# Patient Record
Sex: Male | Born: 1950
Health system: Southern US, Community
[De-identification: ages and names within clinical notes are randomized; demographics above are authoritative.]

## PROBLEM LIST (undated history)

## (undated) DIAGNOSIS — F329 Major depressive disorder, single episode, unspecified: Secondary | ICD-10-CM

## (undated) DIAGNOSIS — N189 Chronic kidney disease, unspecified: Secondary | ICD-10-CM

## (undated) DIAGNOSIS — F419 Anxiety disorder, unspecified: Secondary | ICD-10-CM

## (undated) DIAGNOSIS — Z862 Personal history of diseases of the blood and blood-forming organs and certain disorders involving the immune mechanism: Secondary | ICD-10-CM

## (undated) DIAGNOSIS — Z87442 Personal history of urinary calculi: Secondary | ICD-10-CM

## (undated) DIAGNOSIS — G608 Other hereditary and idiopathic neuropathies: Secondary | ICD-10-CM

## (undated) DIAGNOSIS — N138 Other obstructive and reflux uropathy: Secondary | ICD-10-CM

## (undated) DIAGNOSIS — N401 Enlarged prostate with lower urinary tract symptoms: Secondary | ICD-10-CM

## (undated) DIAGNOSIS — M1990 Unspecified osteoarthritis, unspecified site: Secondary | ICD-10-CM

## (undated) DIAGNOSIS — C61 Malignant neoplasm of prostate: Secondary | ICD-10-CM

## (undated) DIAGNOSIS — N529 Male erectile dysfunction, unspecified: Secondary | ICD-10-CM

## (undated) DIAGNOSIS — M549 Dorsalgia, unspecified: Secondary | ICD-10-CM

## (undated) DIAGNOSIS — G609 Hereditary and idiopathic neuropathy, unspecified: Secondary | ICD-10-CM

## (undated) DIAGNOSIS — N410 Acute prostatitis: Secondary | ICD-10-CM

## (undated) DIAGNOSIS — I7121 Aneurysm of the ascending aorta, without rupture: Secondary | ICD-10-CM

## (undated) DIAGNOSIS — G40802 Other epilepsy, not intractable, without status epilepticus: Secondary | ICD-10-CM

## (undated) DIAGNOSIS — E7800 Pure hypercholesterolemia, unspecified: Secondary | ICD-10-CM

## (undated) DIAGNOSIS — R51 Headache: Secondary | ICD-10-CM

## (undated) DIAGNOSIS — F32A Depression, unspecified: Secondary | ICD-10-CM

## (undated) DIAGNOSIS — T7840XA Allergy, unspecified, initial encounter: Secondary | ICD-10-CM

## (undated) DIAGNOSIS — R569 Unspecified convulsions: Secondary | ICD-10-CM

## (undated) HISTORY — DX: Aneurysm of the ascending aorta, without rupture: I71.21

## (undated) HISTORY — DX: Hereditary and idiopathic neuropathy, unspecified: G60.9

## (undated) HISTORY — PX: COLONOSCOPY: SHX174

## (undated) HISTORY — DX: Other hereditary and idiopathic neuropathies: G60.8

## (undated) HISTORY — DX: Other epilepsy, not intractable, without status epilepticus: G40.802

## (undated) HISTORY — PX: TOE SURGERY: SHX1073

## (undated) HISTORY — DX: Chronic kidney disease, unspecified: N18.9

## (undated) HISTORY — PX: ELBOW FRACTURE SURGERY: SHX616

## (undated) HISTORY — PX: BACK SURGERY: SHX140

---

## 2000-12-04 ENCOUNTER — Ambulatory Visit (HOSPITAL_BASED_OUTPATIENT_CLINIC_OR_DEPARTMENT_OTHER): Admission: RE | Admit: 2000-12-04 | Discharge: 2000-12-04 | Payer: Self-pay | Admitting: *Deleted

## 2001-12-16 ENCOUNTER — Emergency Department (HOSPITAL_COMMUNITY): Admission: EM | Admit: 2001-12-16 | Discharge: 2001-12-16 | Payer: Self-pay

## 2001-12-17 ENCOUNTER — Ambulatory Visit (HOSPITAL_COMMUNITY): Admission: RE | Admit: 2001-12-17 | Discharge: 2001-12-17 | Payer: Self-pay

## 2002-05-30 ENCOUNTER — Emergency Department (HOSPITAL_COMMUNITY): Admission: EM | Admit: 2002-05-30 | Discharge: 2002-05-30 | Payer: Self-pay | Admitting: Emergency Medicine

## 2002-05-30 ENCOUNTER — Encounter: Payer: Self-pay | Admitting: Emergency Medicine

## 2002-06-03 ENCOUNTER — Ambulatory Visit (HOSPITAL_COMMUNITY): Admission: RE | Admit: 2002-06-03 | Discharge: 2002-06-03 | Payer: Self-pay | Admitting: Neurology

## 2002-07-02 ENCOUNTER — Ambulatory Visit (HOSPITAL_COMMUNITY): Admission: RE | Admit: 2002-07-02 | Discharge: 2002-07-02 | Payer: Self-pay | Admitting: Neurology

## 2005-10-30 ENCOUNTER — Emergency Department (HOSPITAL_COMMUNITY): Admission: EM | Admit: 2005-10-30 | Discharge: 2005-10-30 | Payer: Self-pay | Admitting: Emergency Medicine

## 2009-03-01 ENCOUNTER — Encounter: Admission: RE | Admit: 2009-03-01 | Discharge: 2009-03-01 | Payer: Self-pay | Admitting: Family Medicine

## 2010-05-30 ENCOUNTER — Encounter: Admission: RE | Admit: 2010-05-30 | Discharge: 2010-05-30 | Payer: Self-pay | Admitting: Family Medicine

## 2011-03-30 NOTE — Consult Note (Signed)
[place] [place]  Patient:    Benjamin [name], Benjamin [name] Visit Number: [id number] MRN: [id number]          Service Type: EMS Location: [place] Attending Physician:  Lorre Nick Proc. Date: 05/30/02 Admit Date:  05/30/2002 Discharge Date: 05/30/2002                            Consultation Report  HISTORY OF PRESENT ILLNESS:  The patient is a 60 year old Caucasian right-handed gentleman who presented to the ER today on May 30, 2002, at 11:16 with two seizures that he suffered at home, one of them observed by his wife.  The patient is a gentleman usually in good health who just recently returned from a vacation on the beach.  Last night he was not able to sleep very well at home and he stated he got less than four hours of sleep as he has a couple of days prior.  He also had diarrhea and nausea but does not recall vomiting.  He states he felt really "lousy" this morning and when his wife came home after a morning walk, she found him with a slowed speech and drowsy appearing, puffy, swollen face in his bed and noticed that on his pillow there was saliva with blood.  The patient said that he was asked by his wife to come down with her and he rested for a moment on the sofa.  Apparently at that time, a second very brief seizure was observed and this time with rhythmic arm jerking, his neck extended, his eyes wide open and the back somewhat bent in ___________ fashion.  The patient seemed to have seized for less than 15 seconds and then became very, very sleepy but was not confused.  The patients wife had meanwhile alarmed EMS squad.  The patient received, in his home, Valium and was then transferred to [facility] ER where was immediately seen and a head CT was obtained; the reading of the radiologist is negative for any acute changes.  In addition, there were blood tests obtained and a white blood cell count was slightly elevated at 11.4,  hemoglobin 15.5, hematocrit 44.8.  The patient has no abnormal platelet count either at 193.  He has some demargination of his neutrophils at 88% and his granulocytes are 10%.  Lymphocytes appear low in the differential.  No eosinophilia or basophilia.  A metabolic panel was also obtained showing a normal chem-7 except for slightly elevated glucose which is probably related to the patients late intake of crackers and juice.  Calcium, total protein and albumin were also all within normal range as were the AST, ALT, alkaline phosphatase, and bilirubin.  A drug screen was not obtained and the patient appears borderline febrile here in the ER with temperatures between 100 and 99.7.  His blood pressure is 120/80, heart rate is in the 70s, respiratory rate at rest is 16.  He is still very drowsy.  PAST MEDICAL HISTORY:  The patient had a seizure in February which was precipitated by a bronchitis exacerbation.  The patient was, at the same time, treated doxycycline.  He has a history of psoriasis with osteoarthritis and low back pain.  MEDICATIONS:  None.  ALLERGIES:  No known drug allergies.  FAMILY HISTORY:  Negative for seizures.  SOCIAL HISTORY:  The patient is married.  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 The couple has one child.  REVIEW OF SYSTEMS:  The patient feels drowsy.  His tongue hurts.  He has trouble swallowing without pain.  No dysarthria, however.  He can follow all commands.  He is alert and oriented x3, very pleasant as was his family.  He has no recollection of the seizures but believes that he was not confused afterwards which is confirmed by his wifes report.  PHYSICAL EXAMINATION:  Cranial nerves:  Pupils are equal, round and reactive to light and accommodation.  The patient has full visual fields with bilateral stimulation simultaneously.  The retina appears normally perfused.  There is no  pallor and no swelling in the optic nerve.  The patients visual acuity is intact.  He complains of a headache, especially in the frontal region.  His tongue and uvula are midline. The tongue bite is right lateral.  There is no blood seen anymore.  The lips are intact without any bite signs there but has bitten his inner cheeks.  Facial, sensory and motor are otherwise intact.  The neck was supple.  Motor exam shows equal strength, tone and mass bilaterally. The patient is able to move all extremities.  Sensory is intact to touch, vibration and pinprick.  Deep tendon reflexes can be obtained in the upper extremities as trace.  Patellar reflexes, I cannot elicit.  The patients Achilles tendon reflexes are not elicitable; however, the movement of these limbs is full range of motion and I cannot obtain an upgoing toe response of stimulation nor clonus.  The patient is fairly unable to relax at this point. After turned to the side, he complains about lower back pain.  Palpation shows that his pain is mostly in the lower rib cage on the right side and that he has some flank pain bilaterally.  Coordination and finger-to-nose is intact. No ataxia, tremor, dysmetria.  Gait and station were deferred.  The patient could sit up without assistance but with some low back pain.  ASSESSMENT:  Two seizures in the early morning hours.  The patient is still somewhat slowed and tired but shows no focal deficits, no combativeness and no confusion.  His previous seizure, a single event observed in February of this year, was precipitated by a bronchitis.  This time, he has a gastrointestinal infection or a noninfectious source of abdominal discomfort with nausea and diarrhea which kept him up for a couple of nights now.  He got sufficiently hydrated in the ER.  He has had 1 g of Celebrex IV and I will initiate with his discharge at 300 mg q.h.s. follow-up therapy.  The patient will also be discharged on 1 mg of  Ativan as to allow him to build up a sufficient blood level with additional cover.  He is not suppose to drive, etc., for 14 days.  He is not to operate machinery or even drive a riding lawnmower and he should not swim in unattended waters.  He was told rather to use a shower than a bathtub for risk of drowning until his antiepileptic medication has reached a sufficient blood level.  He is suppose to follow up with me or Candy Sledge, M.D., whom he saw in February at the [facility] office on [address] phone number [phone].  We will try to place the appointment with an EEG at the time visit.  My recommendations were discussed with the ER attending on duty.  The patient  appears stabilized. Attending Physician:  Lorre Nick DD:  05/30/02 TD:  06/03/02 Job: [id number]

## 2012-07-03 DIAGNOSIS — C61 Malignant neoplasm of prostate: Secondary | ICD-10-CM

## 2012-07-03 DIAGNOSIS — N410 Acute prostatitis: Secondary | ICD-10-CM

## 2012-07-03 HISTORY — DX: Malignant neoplasm of prostate: C61

## 2012-07-03 HISTORY — DX: Acute prostatitis: N41.0

## 2012-07-06 ENCOUNTER — Encounter (HOSPITAL_COMMUNITY): Payer: Self-pay | Admitting: Emergency Medicine

## 2012-07-06 ENCOUNTER — Inpatient Hospital Stay (HOSPITAL_COMMUNITY)
Admission: EM | Admit: 2012-07-06 | Discharge: 2012-07-08 | DRG: 699 | Disposition: A | Discharge status: Home / Self Care | Payer: Managed Care, Other (non HMO) | Attending: Urology | Admitting: Urology

## 2012-07-06 DIAGNOSIS — Z888 Allergy status to other drugs, medicaments and biological substances status: Secondary | ICD-10-CM

## 2012-07-06 DIAGNOSIS — IMO0002 Reserved for concepts with insufficient information to code with codable children: Principal | ICD-10-CM | POA: Diagnosis present

## 2012-07-06 DIAGNOSIS — Y92009 Unspecified place in unspecified non-institutional (private) residence as the place of occurrence of the external cause: Secondary | ICD-10-CM

## 2012-07-06 DIAGNOSIS — N9989 Other postprocedural complications and disorders of genitourinary system: Principal | ICD-10-CM | POA: Diagnosis present

## 2012-07-06 DIAGNOSIS — N390 Urinary tract infection, site not specified: Secondary | ICD-10-CM

## 2012-07-06 DIAGNOSIS — T8140XA Infection following a procedure, unspecified, initial encounter: Secondary | ICD-10-CM

## 2012-07-06 DIAGNOSIS — C61 Malignant neoplasm of prostate: Secondary | ICD-10-CM | POA: Diagnosis present

## 2012-07-06 DIAGNOSIS — N410 Acute prostatitis: Secondary | ICD-10-CM | POA: Diagnosis present

## 2012-07-06 DIAGNOSIS — A498 Other bacterial infections of unspecified site: Secondary | ICD-10-CM | POA: Diagnosis present

## 2012-07-06 DIAGNOSIS — Y849 Medical procedure, unspecified as the cause of abnormal reaction of the patient, or of later complication, without mention of misadventure at the time of the procedure: Secondary | ICD-10-CM | POA: Diagnosis present

## 2012-07-06 HISTORY — DX: Unspecified convulsions: R56.9

## 2012-07-06 LAB — BASIC METABOLIC PANEL
CO2: 30 mEq/L (ref 19–32)
Chloride: 99 mEq/L (ref 96–112)
GFR calc Af Amer: 79 mL/min — ABNORMAL LOW (ref >90)
Potassium: 3.7 mEq/L (ref 3.5–5.1)
Sodium: 138 mEq/L (ref 135–145)

## 2012-07-06 LAB — CBC
Platelets: 150 10*3/uL (ref 150–400)
RBC: 5.27 MIL/uL (ref 4.22–5.81)
RDW: 13.1 % (ref 11.5–15.5)
WBC: 14.6 10*3/uL — ABNORMAL HIGH (ref 4.0–10.5)

## 2012-07-06 LAB — URINALYSIS, ROUTINE W REFLEX MICROSCOPIC
Nitrite: POSITIVE — AB
Protein, ur: NEGATIVE mg/dL
Specific Gravity, Urine: 1.025 (ref 1.005–1.030)
Urobilinogen, UA: 1 mg/dL (ref 0.0–1.0)

## 2012-07-06 MED ORDER — LEVETIRACETAM 750 MG PO TABS
750.0000 mg | ORAL_TABLET | Freq: Two times a day (BID) | ORAL | Status: DC
  Administered 2012-07-06 – 2012-07-08 (×4): 750 mg via ORAL
  Filled 2012-07-06 (×6): qty 1

## 2012-07-06 MED ORDER — ZOLPIDEM TARTRATE 5 MG PO TABS: 5.0000 mg | ORAL_TABLET | Freq: Every evening | ORAL | Status: DC | PRN

## 2012-07-06 MED ORDER — DOCUSATE SODIUM 100 MG PO CAPS
100.0000 mg | ORAL_CAPSULE | Freq: Two times a day (BID) | ORAL | Status: DC
  Administered 2012-07-06 – 2012-07-08 (×4): 100 mg via ORAL
  Filled 2012-07-06 (×5): qty 1

## 2012-07-06 MED ORDER — HYDROCODONE-ACETAMINOPHEN 5-325 MG PO TABS: 1.0000 | ORAL_TABLET | ORAL | Status: DC | PRN

## 2012-07-06 MED ORDER — ACETAMINOPHEN 325 MG PO TABS: 650.0000 mg | ORAL_TABLET | ORAL | Status: DC | PRN

## 2012-07-06 MED ORDER — DEXTROSE-NACL 5-0.45 % IV SOLN: INTRAVENOUS | Status: DC

## 2012-07-06 MED ORDER — PIPERACILLIN-TAZOBACTAM 3.375 G IVPB
3.3750 g | Freq: Three times a day (TID) | INTRAVENOUS | Status: DC
  Administered 2012-07-06 – 2012-07-08 (×7): 3.375 g via INTRAVENOUS
  Filled 2012-07-06 (×8): qty 50

## 2012-07-06 MED ORDER — PIPERACILLIN-TAZOBACTAM 3.375 G IVPB: 3.3750 g | Freq: Three times a day (TID) | INTRAVENOUS | Status: DC

## 2012-07-06 MED ORDER — ACETAMINOPHEN 325 MG PO TABS
650.0000 mg | ORAL_TABLET | ORAL | Status: DC | PRN
  Administered 2012-07-06 – 2012-07-08 (×3): 650 mg via ORAL
  Filled 2012-07-06 (×3): qty 2

## 2012-07-06 MED ORDER — SODIUM CHLORIDE 0.45 % IV SOLN
INTRAVENOUS | Status: DC
  Administered 2012-07-06 – 2012-07-08 (×4): via INTRAVENOUS

## 2012-07-06 MED ORDER — DOCUSATE SODIUM 100 MG PO CAPS: 100.0000 mg | ORAL_CAPSULE | Freq: Two times a day (BID) | ORAL | Status: DC

## 2012-07-06 NOTE — ED Notes (Signed)
Pt states that he had a prostate biopsy on Thursday and yesterday started not feeling well--cold chills, dysuria.  Denies NVD.  Had a fever 101.3 last night and took 1000 mg tylenol.

## 2012-07-06 NOTE — ED Notes (Signed)
Attempted to call report.  Nurse busy. 

## 2012-07-06 NOTE — ED Provider Notes (Signed)
History     CSN: [id number]  Arrival date & time 07/06/12  1057   First MD Initiated Contact with Patient 07/06/12 1146      No chief complaint on file.   (Consider location/radiation/quality/duration/timing/severity/associated sxs/prior treatment) HPI Comments: Mr. Sohn had a prostate biopsy performed by his urologist on 8/23 (secondary to elevated PSA and screening for prostate CA).  He states there were no complications during the procedure and he was discharged home shortly after it's completion.  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  Yesterday evening he developed intense chills and fever.  He also states he went to urinate many times and felt as if his stream was not a strong as usual.  Currently he states he has mild discomfort but no severe pain. He deniess nausea and vomiting.  The history is provided by the patient and the spouse. No language interpreter was used.    No past medical history on file.  No past surgical history on file.  No family history on file.  History  Substance Use Topics  . Smoking status: Not on file  . Smokeless tobacco: Not on file  . Alcohol Use: Not on file      Review of Systems  Constitutional: Positive for fever, chills and fatigue. Negative for diaphoresis, activity change and appetite change.  Eyes: Negative.   Respiratory: Negative.   Cardiovascular: Negative.   Gastrointestinal: Negative for nausea, vomiting, abdominal pain, diarrhea, constipation, blood in stool, abdominal distention and anal bleeding.  Genitourinary: Positive for urgency, frequency and difficulty urinating. Negative for dysuria, hematuria, flank pain, decreased urine volume, scrotal swelling, enuresis, genital sores, penile pain and testicular pain.  Musculoskeletal: Negative.   Neurological: Negative.   Psychiatric/Behavioral: Negative.     Allergies  Review of patient's allergies indicates no  known allergies.  Home Medications   Current Outpatient Rx  Name Route Sig Dispense Refill  . ACETAMINOPHEN 325 MG PO TABS Oral Take 650 mg by mouth every 6 (six) hours as needed.    . ACETAMINOPHEN 500 MG PO TABS Oral Take 500 mg by mouth every 6 (six) hours as needed. Pain    . DOXYCYCLINE HYCLATE 100 MG PO TABS Oral Take 100 mg by mouth 2 (two) times daily.    Marland Kitchen LEVETIRACETAM 750 MG PO TABS Oral Take 750 mg by mouth every 12 (twelve) hours.    . OMEGA-3 FATTY ACIDS 1000 MG PO CAPS Oral Take 2 g by mouth daily.      BP 126/86  Pulse 75  Temp 98.2 F (36.8 C) (Oral)  Resp 16  SpO2 98%  Physical Exam  Nursing note and vitals reviewed. Constitutional: He appears well-developed and well-nourished. No distress.  HENT:  Head: Normocephalic and atraumatic.  Right Ear: External ear normal.  Left Ear: External ear normal.  Nose: Nose normal.  Mouth/Throat: Oropharynx is clear and moist. No oropharyngeal exudate.  Eyes: Conjunctivae and EOM are normal. Pupils are equal, round, and reactive to light. Right eye exhibits no discharge. Left eye exhibits no discharge. No scleral icterus.  Neck: Normal range of motion. Neck supple. No JVD present. No tracheal deviation present. No thyromegaly present.  Cardiovascular: Normal rate, regular rhythm and intact distal pulses.  Exam reveals no gallop and no friction rub.   No murmur heard. Pulmonary/Chest: Effort normal and breath sounds normal. No stridor. No respiratory distress. He has no wheezes. He has no rales. He  exhibits no tenderness.  Abdominal: Soft. Bowel sounds are normal. He exhibits no distension and no mass. There is no tenderness. There is no rebound and no guarding.  Musculoskeletal: Normal range of motion. He exhibits no edema and no tenderness.  Lymphadenopathy:    He has no cervical adenopathy.  Skin: Skin is warm and dry. No rash noted. He is not diaphoretic. No erythema. No pallor.  Psychiatric: He has a normal mood and  affect. His behavior is normal. Thought content normal.    ED Course  Procedures (including critical care time)   Labs Reviewed  URINALYSIS, ROUTINE W REFLEX MICROSCOPIC  CBC  BASIC METABOLIC PANEL  URINE CULTURE   No results found.   No diagnosis found.    MDM  Pt presents 2 days s/p prostate biopsy for evaluation of fever and chills.  He also reports an increased urinary freq and urgency and a sensation of incomplete bladder emptying since having the procedure performed.  He currently is comfortable, afebrile (did rec tylenol prior to coming to ER) - NAD.  Plan U/A and urine culture + basic labs.  Pt voided and a bladder scan revealed 100cc retained urine.  Plan allow pt to attempt voiding again and repeat bladder scan.  Will review results as available and provide appropriate disposition.  Note leukocytosis an abnormal U/A.  Urine culture has been obtained.  Discussed his procedure and subsequent symptoms with Dr. Radene Journey (urologist).  Although he did not perform the procedure, he is familiar with his case.  Secondary to fever and chills (concerns for infection), he will be admitted for observation and parenteral antibiotics.      Tobin Chad, MD 07/06/12 1447

## 2012-07-06 NOTE — ED Notes (Signed)
Bladder scan showed 109 ml

## 2012-07-06 NOTE — H&P (Signed)
Urology History and Physical Exam  CC: Fever,chills  HPI: 61 year old male presents to the ER @ [facility] with a 24 hour history of fever, chills and difficulty urinating. He underwent transrectal ultrasound and biopsy of his prostate for elevated PSA by Dr. Barron Alvine in our office on 07/03/2012. The procedure was without difficulty. He had a relatively small prostate. Yesterday, he began having fever to 101.4, as well as chills. Additionally, he started having urinary frequency, urgency, dysuria and feeling of incomplete emptying. He called me last night, and I started on doxycycline. I told him that if he is not better within 12-16 hours to call back. He called back this morning, again with persistent fever, shakes, chills and greater difficulty urinating. It was recommended that he come to the emergency room for further evaluation/probable admission.  PMH: Past Medical History  Diagnosis Date  . Seizures     PSH: History reviewed. No pertinent past surgical history.  Allergies: Allergies  Allergen Reactions  . Benadryl (Diphenhydramine)     seizures    Medications:  (Not in a hospital admission)   Social History: History   Social History  . Marital Status: Married    Spouse Name: N/A    Number of Children: N/A  . Years of Education: N/A   Occupational History  . Not on file.   Social History Main Topics  . Smoking status: Never Smoker   . Smokeless tobacco: Not on file  . Alcohol Use: No  . Drug Use: No  . Sexually Active:    Other Topics Concern  . Not on file   Social History Narrative  . No narrative on file    Family History: History reviewed. No pertinent family history.  Review of Systems: Positive: Fever, chills, dysuria, frequency, urgency, back pain, neurologic symptoms to his lower extremities from spinal stenosis Negative:   A further 10 point review of systems was negative except what is listed in the HPI.  Physical  Exam: @VITALS2 @ General: No acute distress.  Awake. Skin was warm to the touch. Head:  Normocephalic.  Atraumatic. ENT:  EOMI.  Mucous membranes moist Neck:  Supple.  No lymphadenopathy. CV:  S1 present. S2 present. Regular rate. Pulmonary: Equal effort bilaterally.  Clear to auscultation bilaterally. Abdomen: Soft.  Non- tender to palpation. Bladder is nonpalpable. Skin:  Normal turgor.  No visible rash. Extremity: No gross deformity of bilateral upper extremities.  No gross deformity of    bilateral lower extremities. Neurologic: Alert. Appropriate mood.  Penis:  Circumcised.  No lesions. Urethra:           Orthotopic meatus. Scrotum: No lesions.  No ecchymosis.  No erythema. Testicles: Descended bilaterally.  No masses bilaterally. No tenderness. Epididymis: Palpable bilaterally.  Non Tender to palpation.  Studies:  Recent Labs  Basename 07/06/12 1235   HGB 16.6   WBC 14.6*   PLT 150    Recent Labs  Basename 07/06/12 1235   NA 138   K 3.7   CL 99   CO2 30   BUN 12   CREATININE 1.15   CALCIUM 9.7   GFRNONAA 68*   GFRAA 79*     No results found for this basename: PT:2,INR:2,APTT:2 in the last 72 hours   No components found with this basename: ABG:2    Assessment:  Fever/chills/urinary difficulty following transrectal biopsy of his prostate 3 days ago. The patient may well have a resistant prostatitis/UTI. He does not appear toxic, but he  does have leukocytosis and infected urine.  Plan: I discussed admission with the patient and his wife. I would suggest we bring him in. He seems to be emptying out decently, but may need a catheter if he is having retention later on. I will put him on Zosyn, and urine cultures have been sent.

## 2012-07-06 NOTE — ED Notes (Addendum)
Pt states he had a prostate biopsy on Thursday and has now had difficulty urinating. He has the urge to go but is having to strain in order to get urine out and is still having low output. Pt states he is going to the restroom multiple times during the day. Pt began running a fever last night and was down to 99 this morning. Here in the ED his time in 98.2 orally. Instructed pt to get into gown and will return to scan bladder.

## 2012-07-07 LAB — CBC
HCT: 41.7 % (ref 39.0–52.0)
Hemoglobin: 14.6 g/dL (ref 13.0–17.0)
MCH: 30.9 pg (ref 26.0–34.0)
MCHC: 35 g/dL (ref 30.0–36.0)
MCV: 88.3 fL (ref 78.0–100.0)
RBC: 4.72 MIL/uL (ref 4.22–5.81)

## 2012-07-07 NOTE — Progress Notes (Signed)
   CARE MANAGEMENT NOTE 07/07/2012  Patient:  Benjamin [name], Benjamin [name]   Account Number:  [id number]  Date Initiated:  07/07/2012  Documentation initiated by:  Jiles Crocker  Subjective/Objective Assessment:   ADMITTED WITH Febrile urinary tract infections/prostatitis status post prostate biopsy.     Action/Plan:   LIVES WITH SPOUSE   Anticipated DC Date:  07/14/2012   Anticipated DC Plan:  HOME/SELF CARE      DC Planning Services  CM consult            Status of service:  In process, will continue to follow Medicare Important Message given?  NA - LOS <3 / Initial given by admissions (If response is "NO", the following Medicare IM given date fields will be blank)  Per UR Regulation:  Reviewed for med. necessity/level of care/duration of stay  Comments:  07/07/2012- B Emmylou Bieker RN, BSN, MHA

## 2012-07-07 NOTE — Progress Notes (Signed)
Patient has ambulated in hallway this afternoon >200 feet. Patient tolerated well. Incentive spirometer also encouraged.Will continue to monitor patient. Setzer, Don Broach

## 2012-07-07 NOTE — Progress Notes (Signed)
Patient ID: Benjamin [name], male   DOB: [birth year], 61 y.o.   MRN: [id number]   Subjective: Patient reports Feeling much better this morning. Some shaking chills last evening. He has now been afebrile. Voiding okay.  Objective: Vital signs in last 24 hours: Temp:  [98.3 F (36.8 C)-102.4 F (39.1 C)] 98.3 F (36.8 C) (08/26 0605) Pulse Rate:  [77-98] 77  (08/26 0605) Resp:  [18] 18  (08/26 0605) BP: (116-137)/(78-81) 116/80 mmHg (08/26 0605) SpO2:  [95 %-99 %] 95 % (08/26 0605) Weight:  [97.3 kg (214 lb 8.1 oz)] 97.3 kg (214 lb 8.1 oz) (08/25 1544)  Intake/Output from previous day: 08/25 0701 - 08/26 0700 In: 2236.3 [P.O.:960; I.V.:1126.3; IV Piggyback:150] Out: 1800 [Urine:1800] Intake/Output this shift: Total I/O In: 240 [P.O.:240] Out: 280 [Urine:280]  Physical Exam:  Constitutional: Vital signs reviewed. WD WN in NAD   Eyes: PERRL, No scleral icterus.   Cardiovascular: RRR Pulmonary/Chest: Normal effort Abdominal: Soft. Non-tender, non-distended, bowel sounds are normal, no masses, organomegaly, or guarding present.  Extremities: No cyanosis or edema   Lab Results:  Basename 07/07/12 0501 07/06/12 1235  HGB 14.6 16.6  HCT 41.7 46.7   BMET  Basename 07/06/12 1235  NA 138  K 3.7  CL 99  CO2 30  GLUCOSE 124*  BUN 12  CREATININE 1.15  CALCIUM 9.7   No results found for this basename: LABPT:3,INR:3 in the last 72 hours No results found for this basename: LABURIN:1 in the last 72 hours No results found for this or any previous visit.  Studies/Results: No results found.  Assessment/Plan:   Febrile urinary tract infections/prostatitis status post prostate biopsy. Patient appears to be clinically improved on IV Zosyn. He will need to stay on IV antibiotics until all urine culture data and sensitivities are back. Will review pathology from the office.   LOS: 1 day   Cordarryl Monrreal S 07/07/2012, 1:14 PM

## 2012-07-07 NOTE — Progress Notes (Signed)
ANTIBIOTIC CONSULT NOTE - FOLLOW UP  Pharmacy Consult for pharmacy to adjust antibiotics (zosyn) for renal function Indication: Prostatitis  Allergies  Allergen Reactions  . Benadryl (Diphenhydramine)     seizures    Patient Measurements: Height: 6\' 1"  (185.4 cm) Weight: 214 lb 8.1 oz (97.3 kg) IBW/kg (Calculated) : 79.9  Adjusted Body Weight:  Vital Signs: Temp: 98.3 F (36.8 C) (08/26 0605) Temp src: Oral (08/26 0605) BP: 116/80 mmHg (08/26 0605) Pulse Rate: 77  (08/26 0605) Intake/Output from previous day: 08/25 0701 - 08/26 0700 In: 2236.3 [P.O.:960; I.V.:1126.3; IV Piggyback:150] Out: 1800 [Urine:1800] Intake/Output from this shift: Total I/O In: 240 [P.O.:240] Out: 280 [Urine:280]  Labs:  [facility] 07/07/12 0501 07/06/12 1235  WBC 10.9* 14.6*  HGB 14.6 16.6  PLT 131* 150  LABCREA -- --  CREATININE -- 1.15   Estimated Creatinine Clearance: 85 ml/min (by C-G formula based on Cr of 1.15). No results found for this basename: VANCOTROUGH:2,VANCOPEAK:2,VANCORANDOM:2,GENTTROUGH:2,GENTPEAK:2,GENTRANDOM:2,TOBRATROUGH:2,TOBRAPEAK:2,TOBRARND:2,AMIKACINPEAK:2,AMIKACINTROU:2,AMIKACIN:2, in the last 72 hours   Microbiology: No results found for this or any previous visit (from the past 720 hour(s)).  Anti-infectives     Start     Dose/Rate Route Frequency Ordered Stop   07/06/12 1600  piperacillin-tazobactam (ZOSYN) IVPB 3.375 g       3.375 g 12.5 mL/hr over 240 Minutes Intravenous 3 times per day 07/06/12 1548     07/06/12 1400   piperacillin-tazobactam (ZOSYN) IVPB 3.375 g  Status:  Discontinued        3.375 g 12.5 mL/hr over 240 Minutes Intravenous 3 times per day 07/06/12 1350 07/06/12 1351          Assessment: 61 YOM presents with fever and chills with difficulty urinating.  S/p prostate biopsy.  He is on D#1 zosyn.  WBC improving, afebrile  8/25 Urine cx: pending  Goal of Therapy:  Resolution of infection  Plan:  -Continue Zosyn 3.375 gm IV q8h with  4h infusion, f/u culture results  Dannielle Huh 07/07/2012,1:18 PM

## 2012-07-08 LAB — URINE CULTURE: Colony Count: 25000

## 2012-07-08 NOTE — Progress Notes (Signed)
Patient ID: Benjamin [name], male   DOB: Sep 02, 1951, 61 y.o.   MRN: [id number]   Subjective: Patient reports doing better today. He is voiding fairly well at this time. He has been afebrile. Urine culture is voluntarily positive for Escherichia coli with sensitivities are still not available.  Objective: Vital signs in last 24 hours: Temp:  [97.9 F (36.6 C)-98.8 F (37.1 C)] 97.9 F (36.6 C) (08/27 0605) Pulse Rate:  [69-79] 69  (08/27 0605) Resp:  [17-18] 18  (08/27 0605) BP: (98-119)/(62-76) 99/65 mmHg (08/27 0605) SpO2:  [96 %-98 %] 98 % (08/27 0605)  Intake/Output from previous day: 08/26 0701 - 08/27 0700 In: 2430 [P.O.:480; I.V.:1800; IV Piggyback:150] Out: 1180 [Urine:1180] Intake/Output this shift: Total I/O In: -  Out: 550 [Urine:550]  Physical Exam:  Constitutional: Vital signs reviewed. WD WN in NAD   Eyes: PERRL, No scleral icterus.   Cardiovascular: RRR Pulmonary/Chest: Normal effort Abdominal: Soft. Non-tender, non-distended, bowel sounds are normal, no masses, organomegaly, or guarding present.  Extremities: No cyanosis or edema   Lab Results:  Basename 07/07/12 0501 07/06/12 1235  HGB 14.6 16.6  HCT 41.7 46.7   BMET  Basename 07/06/12 1235  NA 138  K 3.7  CL 99  CO2 30  GLUCOSE 124*  BUN 12  CREATININE 1.15  CALCIUM 9.7   No results found for this basename: LABPT:3,INR:3 in the last 72 hours No results found for this basename: LABURIN:1 in the last 72 hours Results for orders placed during the hospital encounter of 07/06/12  URINE CULTURE     Status: Normal (Preliminary result)   Collection Time   07/06/12  1:04 PM      Component Value Range Status Comment   Specimen Description URINE, CLEAN CATCH   Final    Special Requests NONE   Final    Culture  Setup Time 07/06/2012 16:55   Final    Colony Count 25,000 COLONIES/ML   Final    Culture ESCHERICHIA COLI   Final    Report Status PENDING   Incomplete     Studies/Results: No results  found.  Assessment/Plan:   Febrile urinary tract infection/acute prostatitis status post transrectal ultrasound of the prostate with biopsy. Patient is clinically much better. I would like to keep him on IV antibiotics until sensitivities are available to see if there is an appropriate oral choice. Hopefully discharge could be later today or tomorrow morning if that information becomes available. The patient would probably benefit from a short course of alpha blocker therapy as well. [name] biopsies unfortunately were positive for adenocarcinoma. One out of 12 biopsies did show Gleason 3+3 equal 6 cancer. 2 additional biopsy showed some atypia. The patient has what appears to be favorable clinical stage TI C. disease. With a pulmonary discussion about that today and I will arrange followup with cancer conference for he and his wife.   LOS: 2 days   Benjamin [name] S 07/08/2012, 8:12 AM

## 2012-07-08 NOTE — Discharge Summary (Signed)
Physician Discharge Summary  Patient ID: Halil Rentz MRN: [id number] DOB/AGE: 59-Dec-1952 59 y.o.  Admit date: 07/06/2012 Discharge date: 07/08/2012  Admission Diagnoses:Acute prostatitis  Discharge Diagnoses: #1 acute prostatitis #2 his prostate cancer Active Problems:  * No active hospital problems. *    Discharged Condition: good  Hospital Course: Patient was admitted with an acute prostatitis status post an ultrasound biopsy of the prostate. [name] urine culture did show Escherichia coli. The patient was treated with IV Zosyn with marked clinical improvement. Of note the patient's biopsy did show one out of 12 cores positive for adenocarcinoma of the prostate.  Consults: None  Significant Diagnostic Studies: Urine culture  Treatments: antibiotics: Zosyn  Discharge Exam: Blood pressure 129/79, pulse 71, temperature 97.6 F (36.4 C), temperature source Oral, resp. rate 18, height 6\' 1"  (1.854 m), weight 97.3 kg (214 lb 8.1 oz), SpO2 99.00%. Well-developed well-nourished male in no acute distress. Abdomen soft nontender.  Disposition: Patient to be discharged to home. Prescription sent for antibiotic  as well as tamsulosin. Followup arranged.  Medication List  As of 07/08/2012  2:59 PM   TAKE these medications         acetaminophen 325 MG tablet   Commonly known as: TYLENOL   Take 650 mg by mouth every 6 (six) hours as needed.      acetaminophen 500 MG tablet   Commonly known as: TYLENOL   Take 500 mg by mouth every 6 (six) hours as needed. Pain      doxycycline 100 MG tablet   Commonly known as: VIBRA-TABS   Take 100 mg by mouth 2 (two) times daily.      fish oil-omega-3 fatty acids 1000 MG capsule   Take 2 g by mouth daily.      levETIRAcetam 750 MG tablet   Commonly known as: KEPPRA   Take 750 mg by mouth every 12 (twelve) hours.           Follow-up Information    Follow up with Valetta Fuller, MD on 08/04/2012. (At 4:00)    Contact information:   [address] [place], [address] [facility] [place] [address] [phone]          Signed: Valetta Fuller 07/08/2012, 2:59 PM

## 2012-12-16 ENCOUNTER — Ambulatory Visit: Payer: Managed Care, Other (non HMO) | Admitting: Radiation Oncology

## 2012-12-16 ENCOUNTER — Ambulatory Visit: Payer: Managed Care, Other (non HMO)

## 2012-12-22 ENCOUNTER — Encounter: Payer: Self-pay | Admitting: Radiation Oncology

## 2012-12-22 NOTE — Progress Notes (Signed)
New Consult Prostate Cancer DX 07/03/12 Gleason=3+3=6, Volume=20cc,PSA=5.9 Under active surveillance,  Married,1 daughter, Jola Babinski, alert,oriented x 3, no dysuria, feels he has to void at times, and doesn't go , then other times urgency , doesn't empty bladder at times, bowels regular Back pain(lower) s/p stenosis ,     Allergies:Aspartame powder,Sinus formula tablet  No history Radiation No history Pacemaker

## 2012-12-23 ENCOUNTER — Ambulatory Visit
Admission: RE | Admit: 2012-12-23 | Discharge: 2012-12-23 | Disposition: A | Discharge status: Home / Self Care | Payer: Managed Care, Other (non HMO) | Source: Ambulatory Visit | Attending: Radiation Oncology | Admitting: Radiation Oncology

## 2012-12-23 ENCOUNTER — Encounter: Payer: Self-pay | Admitting: Radiation Oncology

## 2012-12-23 VITALS — BP 128/87 | HR 78 | Temp 98.2°F | Resp 20 | Ht 71.0 in | Wt 224.3 lb

## 2012-12-23 DIAGNOSIS — C61 Malignant neoplasm of prostate: Secondary | ICD-10-CM

## 2012-12-23 DIAGNOSIS — N529 Male erectile dysfunction, unspecified: Secondary | ICD-10-CM | POA: Insufficient documentation

## 2012-12-23 DIAGNOSIS — E7800 Pure hypercholesterolemia, unspecified: Secondary | ICD-10-CM | POA: Insufficient documentation

## 2012-12-23 DIAGNOSIS — Z79899 Other long term (current) drug therapy: Secondary | ICD-10-CM | POA: Insufficient documentation

## 2012-12-23 HISTORY — DX: Malignant neoplasm of prostate: C61

## 2012-12-23 HISTORY — DX: Pure hypercholesterolemia, unspecified: E78.00

## 2012-12-23 HISTORY — DX: Benign prostatic hyperplasia with lower urinary tract symptoms: N13.8

## 2012-12-23 HISTORY — DX: Depression, unspecified: F32.A

## 2012-12-23 HISTORY — DX: Unspecified osteoarthritis, unspecified site: M19.90

## 2012-12-23 HISTORY — DX: Major depressive disorder, single episode, unspecified: F32.9

## 2012-12-23 HISTORY — DX: Acute prostatitis: N41.0

## 2012-12-23 HISTORY — DX: Dorsalgia, unspecified: M54.9

## 2012-12-23 HISTORY — DX: Male erectile dysfunction, unspecified: N52.9

## 2012-12-23 HISTORY — DX: Personal history of diseases of the blood and blood-forming organs and certain disorders involving the immune mechanism: Z86.2

## 2012-12-23 HISTORY — DX: Allergy, unspecified, initial encounter: T78.40XA

## 2012-12-23 HISTORY — DX: Anxiety disorder, unspecified: F41.9

## 2012-12-23 HISTORY — DX: Benign prostatic hyperplasia with lower urinary tract symptoms: N40.1

## 2012-12-23 NOTE — Progress Notes (Signed)
[facility] Radiation Oncology NEW PATIENT EVALUATION  Name: Benjamin [name] MRN: [id number]  Date:   12/23/2012           DOB: 1951/05/14  Status: outpatient   CC:   Valetta Fuller, MD    REFERRING PHYSICIAN: Valetta Fuller, MD   DIAGNOSIS:  Stage TI C. favorable risk adenocarcinoma prostate  HISTORY OF PRESENT ILLNESS:  Benjamin [name] is a 62 y.o. male who is seen today for the courtesy of Dr. Barron Alvine for evaluation of his stage TI C. favorable risk adenocarcinoma prostate. He had a PSA of 3.2 in August of 2011, rising to 4.1 in early 2013. A repeat PSA was 5.0 with a PSA to percentage of only 10%. He therefore underwent ultrasound-guided biopsies by Dr. Isabel Caprice on 07/03/2012. He was found have Gleason 6 (3+3) and 25% of one core from the left lateral apex. Atypical glands was seen along the left lateral base, and left base. His gland volume was approximately 20 cc. He elected for close surveillance. I understand that he took tamsulosin for a few weeks after his biopsy but did not see a significant change. He's had mild to moderate obstructive symptomatology. His PSA rose to 5.91 on 11/27/2012. His I PSS score today is 16. He is no longer on an alpha blocker/tamsulosin. He does have mild erectile dysfunction. No GI difficulty.  PREVIOUS RADIATION THERAPY: No   PAST MEDICAL HISTORY:  has a past medical history of Seizures; Prostate cancer (07/03/12); Allergy; Anxiety; Depression; Hypercholesterolemia; Acute prostatitis (07/03/12); Back pain; BPH (benign prostatic hypertrophy) with urinary obstruction; ED (erectile dysfunction); Arthritis; and History of ITP.     PAST SURGICAL HISTORY: History reviewed. No pertinent past surgical history.   FAMILY HISTORY: family history includes Cancer (age of onset: 1) in his cousin and father. His father may have had prostate cancer in his 92s, he died following a stroke at age 86. His mother is alive age 10.   SOCIAL HISTORY:  reports  that he has never smoked. He has quit using smokeless tobacco. His smokeless tobacco use included Chew. He reports that  drinks alcohol. He reports that he does not use illicit drugs. Married, 5 year old daughter. He works as a [place] [employer].   ALLERGIES: Aspartame and phenylalanine and Benadryl   MEDICATIONS:  Current Outpatient Prescriptions  Medication Sig Dispense Refill  . acetaminophen (TYLENOL) 325 MG tablet Take 650 mg by mouth every 6 (six) hours as needed.      . fish oil-omega-3 fatty acids 1000 MG capsule Take 2 g by mouth daily.      Marland Kitchen levETIRAcetam (KEPPRA) 750 MG tablet Take 750 mg by mouth every 12 (twelve) hours.      Marland Kitchen acetaminophen (TYLENOL) 500 MG tablet Take 500 mg by mouth every 6 (six) hours as needed. Pain      . doxycycline (VIBRA-TABS) 100 MG tablet Take 100 mg by mouth 2 (two) times daily.      . Tamsulosin HCl (FLOMAX) 0.4 MG CAPS Take 0.4 mg by mouth daily after supper.       No current facility-administered medications for this encounter.     REVIEW OF SYSTEMS:  Pertinent items are noted in HPI.    PHYSICAL EXAM:  height is 5\' 11"  (1.803 m) and weight is 224 lb 4.8 oz (101.742 kg). His oral temperature is 98.2 F (36.8 C). His blood pressure is 128/87 and his pulse is 78. His respiration is 20.   Alert and  oriented. Head and neck examination: Grossly unremarkable. Nodes: Without palpable cervical or supraclavicular lymphadenopathy. Chest: Lungs clear. Back: Without spinal or CVA tenderness. Heart: Regular in rhythm. Abdomen: Without masses organomegaly. Genitalia: Unremarkable to inspection. Rectal: Prostate gland is normal size and is without focal induration or nodularity. Extremities: Without edema. Neurologic examination: Grossly nonfocal.   LABORATORY DATA:  Lab Results  Component Value Date   WBC 10.9* 07/07/2012   HGB 14.6 07/07/2012   HCT 41.7 07/07/2012   MCV 88.3 07/07/2012   PLT 131* 07/07/2012   Lab Results  Component Value Date   NA  138 07/06/2012   K 3.7 07/06/2012   CL 99 07/06/2012   CO2 30 07/06/2012   No results found for this basename: ALT, AST, GGT, ALKPHOS, BILITOT   PSA 5.91 from 11/27/2012.   IMPRESSION: Stage TI C. favorable risk adenocarcinoma prostate. I explained to [name] that his prognosis is related to his stage, PSA level, and Gleason score. Other prognostic factors include disease volume and PSA doubling time, and these also appear to be favorable. We discussed surgery versus continued close surveillance versus radiation therapy. Radiation therapy options include seed implantation alone or 8 weeks of external beam/IMRT. He does have mild to moderate urinary obstructive symptomatology which would probably benefit from an extended use of an alpha blocker should he choose seed implantation. We discussed the potential acute and late toxicities of radiation therapy. He tells me he is most concerned about losing his erectile function with either surgery or radiation therapy. I explained to him that Dr. Isabel Caprice would perform  nerve sparing robotic prostatectomy with less than a 10-20% chance for erectile dysfunction. Erectile function would be similar with radiation therapy and  could be expected to respond to Viagra. He wants to think things over and continue with close surveillance in the short term, recognizing that he'll probably need to have definitive treatment considering his young age. If he is followed for more than one year then I would favor repeat prostate biopsies at one to two-year intervals.   PLAN: As discussed above. At the very least, he will maintain close followup with Dr. Isabel Caprice.  I spent 60 minutes minutes face to face with the patient and more than 50% of that time was spent in counseling and/or coordination of care.

## 2012-12-23 NOTE — Progress Notes (Signed)
Please see the Nurse Progress Note in the MD Initial Consult Encounter for this patient. 

## 2013-01-09 ENCOUNTER — Encounter: Payer: Self-pay | Admitting: *Deleted

## 2013-01-09 NOTE — Progress Notes (Signed)
[facility] Psychosocial Distress Screening Clinical Social Work  Clinical Social Work was referred by distress screening protocol.  The patient scored a 7 on the Psychosocial Distress Thermometer which indicates moderate distress. Clinical Social Worker contacted patient to assess for distress and other psychosocial needs. Benjamin [name] states he is doing well, but has several treatment questions after meeting with Dr. Dayton Scrape and Dr. Isabel Caprice.  He states he is concerned about some of the side effects of treatment due to his young age. He is interested in talking with other men who have undergone similar treatment.  CSW discussed matching him up with our prostate cancer support group.  Patient is interested and plans to contact CSW next week.  [name], MSW, LCSW Clinical Social Worker [facility] [phone]

## 2013-05-01 ENCOUNTER — Other Ambulatory Visit: Payer: Self-pay | Admitting: Physician Assistant

## 2013-05-01 DIAGNOSIS — M8580 Other specified disorders of bone density and structure, unspecified site: Secondary | ICD-10-CM

## 2013-05-01 DIAGNOSIS — M810 Age-related osteoporosis without current pathological fracture: Secondary | ICD-10-CM

## 2013-06-01 ENCOUNTER — Ambulatory Visit
Admission: RE | Admit: 2013-06-01 | Discharge: 2013-06-01 | Disposition: A | Discharge status: Home / Self Care | Payer: Managed Care, Other (non HMO) | Source: Ambulatory Visit | Attending: Physician Assistant | Admitting: Physician Assistant

## 2013-06-01 ENCOUNTER — Other Ambulatory Visit: Payer: Managed Care, Other (non HMO)

## 2013-06-01 DIAGNOSIS — M8580 Other specified disorders of bone density and structure, unspecified site: Secondary | ICD-10-CM

## 2013-08-06 ENCOUNTER — Other Ambulatory Visit: Payer: Self-pay | Admitting: Neurosurgery

## 2013-08-11 ENCOUNTER — Ambulatory Visit
Admission: RE | Admit: 2013-08-11 | Discharge: 2013-08-11 | Disposition: A | Discharge status: Home / Self Care | Payer: 59 | Source: Ambulatory Visit | Attending: Neurosurgery | Admitting: Neurosurgery

## 2013-09-16 ENCOUNTER — Encounter: Payer: Self-pay | Admitting: Neurology

## 2013-09-16 ENCOUNTER — Ambulatory Visit (INDEPENDENT_AMBULATORY_CARE_PROVIDER_SITE_OTHER): Payer: 59 | Admitting: Neurology

## 2013-09-16 ENCOUNTER — Encounter (INDEPENDENT_AMBULATORY_CARE_PROVIDER_SITE_OTHER): Payer: Self-pay

## 2013-09-16 VITALS — BP 126/82 | HR 89 | Ht 73.0 in | Wt 218.0 lb

## 2013-09-16 DIAGNOSIS — G40802 Other epilepsy, not intractable, without status epilepticus: Secondary | ICD-10-CM

## 2013-09-16 HISTORY — DX: Other epilepsy, not intractable, without status epilepticus: G40.802

## 2013-09-16 MED ORDER — LEVETIRACETAM 750 MG PO TABS: 750.0000 mg | ORAL_TABLET | Freq: Two times a day (BID) | ORAL | Status: DC
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
## 2013-09-16 NOTE — Patient Instructions (Signed)
Spinal Stenosis  Spinal stenosis is an abnormal narrowing of the canals of your spine (vertebrae).  CAUSES   Spinal stenosis is caused by areas of bone pushing into the central canals of your vertebrae. This condition can be present at birth (congenital). It also may be caused by arthritic deterioration of your vertebrae (spinal degeneration).   SYMPTOMS   · Pain that is generally worse with activities, particularly standing and walking.  · Numbness, tingling, hot or cold sensations, weakness, or weariness in your legs.  · Frequent episodes of falling.  · A foot-slapping gait that leads to muscle weakness.  DIAGNOSIS   Spinal stenosis is diagnosed with the use of magnetic resonance imaging (MRI) or computed tomography (CT).  TREATMENT   Initial therapy for spinal stenosis focuses on the management of the pain and other symptoms associated with the condition. These therapies include:  · Practicing postural changes to lessen pressure on your nerves.  · Exercises to strengthen the core of your body.  · Loss of excess body weight.  · The use of nonsteroidal anti-inflammatory medications to reduce swelling and inflammation in your nerves.  When therapies to manage pain are not successful, surgery to treat spinal stenosis may be recommended. This surgery involves removing excess bone, which puts pressure on your nerve roots. During this surgery (laminectomy), the posterior boney arch (lamina) and excess bone around the facet joints are removed.  Document Released: 01/19/2004 Document Revised: 02/23/2013 Document Reviewed: 02/06/2013  Exit[employer]® Patient Information ©2014 [employer].

## 2013-09-24 ENCOUNTER — Other Ambulatory Visit: Payer: Self-pay | Admitting: Neurosurgery

## 2013-10-06 ENCOUNTER — Ambulatory Visit
Admission: RE | Admit: 2013-10-06 | Discharge: 2013-10-06 | Disposition: A | Discharge status: Home / Self Care | Payer: 59 | Source: Ambulatory Visit | Attending: Neurosurgery | Admitting: Neurosurgery

## 2013-10-16 ENCOUNTER — Other Ambulatory Visit: Payer: Self-pay | Admitting: Neurosurgery

## 2013-12-09 ENCOUNTER — Encounter (HOSPITAL_COMMUNITY): Payer: Self-pay | Admitting: Pharmacy Technician

## 2013-12-15 ENCOUNTER — Encounter (HOSPITAL_COMMUNITY)
Admission: RE | Admit: 2013-12-15 | Discharge: 2013-12-15 | Disposition: A | Discharge status: Home / Self Care | Payer: 59 | Source: Ambulatory Visit | Attending: Neurosurgery | Admitting: Neurosurgery

## 2013-12-15 ENCOUNTER — Encounter (HOSPITAL_COMMUNITY): Payer: Self-pay

## 2013-12-15 DIAGNOSIS — Z01812 Encounter for preprocedural laboratory examination: Secondary | ICD-10-CM | POA: Insufficient documentation

## 2013-12-15 HISTORY — DX: Headache: R51

## 2013-12-15 LAB — CBC
HCT: 42.9 % (ref 39.0–52.0)
Hemoglobin: 15.3 g/dL (ref 13.0–17.0)
MCH: 31.6 pg (ref 26.0–34.0)
MCHC: 35.7 g/dL (ref 30.0–36.0)
MCV: 88.6 fL (ref 78.0–100.0)
PLATELETS: 172 10*3/uL (ref 150–400)
RBC: 4.84 MIL/uL (ref 4.22–5.81)
RDW: 13 % (ref 11.5–15.5)
WBC: 7 10*3/uL (ref 4.0–10.5)

## 2013-12-15 LAB — BASIC METABOLIC PANEL
BUN: 10 mg/dL (ref 6–23)
CHLORIDE: 105 meq/L (ref 96–112)
CO2: 24 meq/L (ref 19–32)
Calcium: 8.8 mg/dL (ref 8.4–10.5)
Creatinine, Ser: 0.89 mg/dL (ref 0.50–1.35)
GFR calc non Af Amer: >90 mL/min (ref >90)
Glucose, Bld: 103 mg/dL — ABNORMAL HIGH (ref 70–99)
POTASSIUM: 4.7 meq/L (ref 3.7–5.3)
SODIUM: 141 meq/L (ref 137–147)

## 2013-12-15 LAB — SURGICAL PCR SCREEN
MRSA, PCR: NEGATIVE
Staphylococcus aureus: POSITIVE — AB

## 2013-12-15 NOTE — Pre-Procedure Instructions (Signed)
Erma Highland  12/15/2013   Your procedure is scheduled on:  Wednesday, December 23, 2013 at 8:30 AM.   Report to [facility] Entrance "A" Admitting Office at 6:30 AM.   Call this number if you have problems the morning of surgery: [phone]   Remember:   Do not eat food or drink liquids after midnight Tuesday, 12/22/13.   Take these medicines the morning of surgery with A SIP OF WATER: levETIRAcetam (KEPPRA), sertraline (ZOLOFT), Tylenol - if needed.  Stop Fish Oil as of today.   Do not wear jewelry.  Do not wear lotions, powders, or cologne. You may wear deodorant.   Men may shave face and neck.  Do not bring valuables to the hospital.  [facility] is not responsible                  for any belongings or valuables.               Contacts, dentures or bridgework may not be worn into surgery.  Leave suitcase in the car. After surgery it may be brought to your room.  For patients admitted to the hospital, discharge time is determined by your                treatment team.           Special Instructions: [facility] - Preparing for Surgery  Before surgery, you can play an important role.  Because skin is not sterile, your skin needs to be as free of germs as possible.  You can reduce the number of germs on you skin by washing with CHG (chlorahexidine gluconate) soap before surgery.  CHG is an antiseptic cleaner which kills germs and bonds with the skin to continue killing germs even after washing.  Please DO NOT use if you have an allergy to CHG or antibacterial soaps.  If your skin becomes reddened/irritated stop using the CHG and inform your nurse when you arrive at Short Stay.  Do not shave (including legs and underarms) for at least 48 hours prior to the first CHG shower.  You may shave your face.  Please follow these instructions carefully:   1.  Shower with CHG Soap the night before surgery and the                                morning of Surgery.  2.  If you choose  to wash your hair, wash your hair first as usual with your       normal shampoo.  3.  After you shampoo, rinse your hair and body thoroughly to remove the                      Shampoo.  4.  Use CHG as you would any other liquid soap.  You can apply chg directly       to the skin and wash gently with scrungie or a clean washcloth.  5.  Apply the CHG Soap to your body ONLY FROM THE NECK DOWN.        Do not use on open wounds or open sores.  Avoid contact with your eyes,       ears, mouth and genitals (private parts).  Wash genitals (private parts)       with your normal soap.  6.  Wash thoroughly, paying special attention to the area where your surgery          will be performed.  7.  Thoroughly rinse your body with warm water from the neck down.  8.  DO NOT shower/wash with your normal soap after using and rinsing off       the CHG Soap.  9.  Pat yourself dry with a clean towel.            10.  Wear clean pajamas.            11.  Place clean sheets on your bed the night of your first shower and do not        sleep with pets. Day of Surgery  Do not apply any lotions/deoderants the morning of surgery.  Please wear clean clothes to the hospital/surgery center.   Please read over the following fact sheets that you were given: Pain Booklet, Coughing and Deep Breathing, MRSA Information and Surgical Site Infection Prevention  

## 2013-12-15 NOTE — Pre-Procedure Instructions (Signed)
Benjamin [name]  12/15/2013   Your procedure is scheduled on:  Wednesday, December 23, 2013 at 8:30 AM.   Report to [facility] Entrance "A" Admitting Office at 6:30 AM.   Call this number if you have problems the morning of surgery: [phone]   Remember:   Do not eat food or drink liquids after midnight Tuesday, 12/22/13.   Take these medicines the morning of surgery with A SIP OF WATER: levETIRAcetam (KEPPRA), sertraline (ZOLOFT), Tylenol - if needed.  Stop Fish Oil as of today.   Do not wear jewelry.  Do not wear lotions, powders, or cologne. You may wear deodorant.   Men may shave face and neck.  Do not bring valuables to the hospital.  [facility] is not responsible                  for any belongings or valuables.               Contacts, dentures or bridgework may not be worn into surgery.  Leave suitcase in the car. After surgery it may be brought to your room.  For patients admitted to the hospital, discharge time is determined by your                treatment team.           Special Instructions: [facility] - Preparing for Surgery  Before surgery, you can play an important role.  Because skin is not sterile, your skin needs to be as free of germs as possible.  You can reduce the number of germs on you skin by washing with CHG (chlorahexidine gluconate) soap before surgery.  CHG is an antiseptic cleaner which kills germs and bonds with the skin to continue killing germs even after washing.  Please DO NOT use if you have an allergy to CHG or antibacterial soaps.  If your skin becomes reddened/irritated stop using the CHG and inform your nurse when you arrive at Short Stay.  Do not shave (including legs and underarms) for at least 48 hours prior to the first CHG shower.  You may shave your face.  Please follow these instructions carefully:   1.  Shower with CHG Soap the night before surgery and the                                morning of Surgery.  2.  If you choose  to wash your hair, wash your hair first as usual with your       normal shampoo.  3.  After you shampoo, rinse your hair and body thoroughly to remove the                      Shampoo.  4.  Use CHG as you would any other liquid soap.  You can apply chg directly       to the skin and wash gently with scrungie or a clean washcloth.  5.  Apply the CHG Soap to your body ONLY FROM THE NECK DOWN.        Do not use on open wounds or open sores.  Avoid contact with your eyes,       ears, mouth and genitals (private parts).  Wash genitals (private parts)       with your normal soap.  6.  Wash thoroughly, paying special attention to the area where your surgery  will be performed.  7.  Thoroughly rinse your body with warm water from the neck down.  8.  DO NOT shower/wash with your normal soap after using and rinsing off       the CHG Soap.  9.  Pat yourself dry with a clean towel.            10.  Wear clean pajamas.            11.  Place clean sheets on your bed the night of your first shower and do not        sleep with pets. Day of Surgery  Do not apply any lotions/deoderants the morning of surgery.  Please wear clean clothes to the hospital/surgery center.   Please read over the following fact sheets that you were given: Pain Booklet, Coughing and Deep Breathing, MRSA Information and Surgical Site Infection Prevention

## 2013-12-22 MED ORDER — CEFAZOLIN SODIUM-DEXTROSE 2-3 GM-% IV SOLR
2.0000 g | INTRAVENOUS | Status: AC
  Administered 2013-12-23: 2 g via INTRAVENOUS

## 2013-12-23 ENCOUNTER — Encounter (HOSPITAL_COMMUNITY)
Admission: RE | Disposition: A | Discharge status: Home / Self Care | Payer: Self-pay | Source: Ambulatory Visit | Attending: Neurosurgery

## 2013-12-23 ENCOUNTER — Inpatient Hospital Stay (HOSPITAL_COMMUNITY): Payer: 59 | Admitting: Certified Registered"

## 2013-12-23 ENCOUNTER — Inpatient Hospital Stay (HOSPITAL_COMMUNITY)
Admission: RE | Admit: 2013-12-23 | Discharge: 2013-12-24 | DRG: 519 | Disposition: A | Discharge status: Home / Self Care | Payer: 59 | Source: Ambulatory Visit | Attending: Neurosurgery | Admitting: Neurosurgery

## 2013-12-23 ENCOUNTER — Encounter (HOSPITAL_COMMUNITY): Payer: Self-pay | Admitting: *Deleted

## 2013-12-23 ENCOUNTER — Inpatient Hospital Stay (HOSPITAL_COMMUNITY): Payer: 59

## 2013-12-23 ENCOUNTER — Encounter (HOSPITAL_COMMUNITY): Payer: 59 | Admitting: Certified Registered"

## 2013-12-23 DIAGNOSIS — G40802 Other epilepsy, not intractable, without status epilepticus: Secondary | ICD-10-CM | POA: Diagnosis present

## 2013-12-23 DIAGNOSIS — Z87891 Personal history of nicotine dependence: Secondary | ICD-10-CM

## 2013-12-23 DIAGNOSIS — F3289 Other specified depressive episodes: Secondary | ICD-10-CM | POA: Diagnosis present

## 2013-12-23 DIAGNOSIS — Z8546 Personal history of malignant neoplasm of prostate: Secondary | ICD-10-CM

## 2013-12-23 DIAGNOSIS — F411 Generalized anxiety disorder: Secondary | ICD-10-CM | POA: Diagnosis present

## 2013-12-23 DIAGNOSIS — N138 Other obstructive and reflux uropathy: Secondary | ICD-10-CM | POA: Diagnosis present

## 2013-12-23 DIAGNOSIS — F329 Major depressive disorder, single episode, unspecified: Secondary | ICD-10-CM | POA: Diagnosis present

## 2013-12-23 DIAGNOSIS — N401 Enlarged prostate with lower urinary tract symptoms: Secondary | ICD-10-CM | POA: Diagnosis present

## 2013-12-23 DIAGNOSIS — M47817 Spondylosis without myelopathy or radiculopathy, lumbosacral region: Secondary | ICD-10-CM | POA: Diagnosis present

## 2013-12-23 DIAGNOSIS — M48062 Spinal stenosis, lumbar region with neurogenic claudication: Principal | ICD-10-CM | POA: Diagnosis present

## 2013-12-23 DIAGNOSIS — E7800 Pure hypercholesterolemia, unspecified: Secondary | ICD-10-CM | POA: Diagnosis present

## 2013-12-23 HISTORY — PX: LUMBAR LAMINECTOMY/DECOMPRESSION MICRODISCECTOMY: SHX5026

## 2013-12-23 SURGERY — LUMBAR LAMINECTOMY/DECOMPRESSION MICRODISCECTOMY 3 LEVELS
Anesthesia: General

## 2013-12-23 MED ORDER — PROMETHAZINE HCL 25 MG/ML IJ SOLN: 6.2500 mg | INTRAMUSCULAR | Status: DC | PRN

## 2013-12-23 MED ORDER — SODIUM CHLORIDE 0.9 % IJ SOLN
INTRAMUSCULAR | Status: AC
  Filled 2013-12-23: qty 10

## 2013-12-23 MED ORDER — LEVETIRACETAM 750 MG PO TABS
750.0000 mg | ORAL_TABLET | Freq: Two times a day (BID) | ORAL | Status: DC
  Administered 2013-12-23: 750 mg via ORAL
  Filled 2013-12-23 (×3): qty 1

## 2013-12-23 MED ORDER — HYDROMORPHONE HCL PF 1 MG/ML IJ SOLN
0.2500 mg | INTRAMUSCULAR | Status: DC | PRN
  Administered 2013-12-23 (×2): 0.5 mg via INTRAVENOUS

## 2013-12-23 MED ORDER — MORPHINE SULFATE 4 MG/ML IJ SOLN: 4.0000 mg | INTRAMUSCULAR | Status: DC | PRN

## 2013-12-23 MED ORDER — HYDROXYZINE HCL 25 MG PO TABS: 50.0000 mg | ORAL_TABLET | ORAL | Status: DC | PRN

## 2013-12-23 MED ORDER — LIDOCAINE-EPINEPHRINE 1 %-1:100000 IJ SOLN
INTRAMUSCULAR | Status: DC | PRN
  Administered 2013-12-23: 20 mL via INTRADERMAL

## 2013-12-23 MED ORDER — SODIUM CHLORIDE 0.9 % IV SOLN: 250.0000 mL | INTRAVENOUS | Status: DC

## 2013-12-23 MED ORDER — SUFENTANIL CITRATE 50 MCG/ML IV SOLN
INTRAVENOUS | Status: AC
  Filled 2013-12-23: qty 1

## 2013-12-23 MED ORDER — 0.9 % SODIUM CHLORIDE (POUR BTL) OPTIME
TOPICAL | Status: DC | PRN
  Administered 2013-12-23 (×3): 1000 mL

## 2013-12-23 MED ORDER — MAGNESIUM HYDROXIDE 400 MG/5ML PO SUSP: 30.0000 mL | Freq: Every day | ORAL | Status: DC | PRN

## 2013-12-23 MED ORDER — MIDAZOLAM HCL 2 MG/2ML IJ SOLN
INTRAMUSCULAR | Status: AC
Start: 2013-12-23 — End: 2013-12-23
  Filled 2013-12-23: qty 2

## 2013-12-23 MED ORDER — CYCLOBENZAPRINE HCL 10 MG PO TABS
10.0000 mg | ORAL_TABLET | Freq: Three times a day (TID) | ORAL | Status: DC | PRN
  Administered 2013-12-23: 10 mg via ORAL
  Filled 2013-12-23: qty 1

## 2013-12-23 MED ORDER — THROMBIN 20000 UNITS EX SOLR
CUTANEOUS | Status: DC | PRN
  Administered 2013-12-23: 10:00:00 via TOPICAL

## 2013-12-23 MED ORDER — OXYCODONE HCL 5 MG/5ML PO SOLN: 5.0000 mg | Freq: Once | ORAL | Status: DC | PRN

## 2013-12-23 MED ORDER — HYDROMORPHONE HCL PF 1 MG/ML IJ SOLN
INTRAMUSCULAR | Status: AC
  Filled 2013-12-23: qty 1

## 2013-12-23 MED ORDER — ROCURONIUM BROMIDE 50 MG/5ML IV SOLN
INTRAVENOUS | Status: AC
  Filled 2013-12-23: qty 1

## 2013-12-23 MED ORDER — LIDOCAINE HCL (CARDIAC) 20 MG/ML IV SOLN
INTRAVENOUS | Status: DC | PRN
  Administered 2013-12-23: 100 mg via INTRAVENOUS

## 2013-12-23 MED ORDER — ACETAMINOPHEN 325 MG PO TABS: 325.0000 mg | ORAL_TABLET | ORAL | Status: DC | PRN

## 2013-12-23 MED ORDER — ACETAMINOPHEN 650 MG RE SUPP: 650.0000 mg | RECTAL | Status: DC | PRN

## 2013-12-23 MED ORDER — THROMBIN 5000 UNITS EX SOLR
OROMUCOSAL | Status: DC | PRN
  Administered 2013-12-23: 11:00:00 via TOPICAL

## 2013-12-23 MED ORDER — ALUM & MAG HYDROXIDE-SIMETH 200-200-20 MG/5ML PO SUSP: 30.0000 mL | Freq: Four times a day (QID) | ORAL | Status: DC | PRN

## 2013-12-23 MED ORDER — PHENYLEPHRINE HCL 10 MG/ML IJ SOLN
INTRAMUSCULAR | Status: DC | PRN
  Administered 2013-12-23 (×2): 80 ug via INTRAVENOUS
  Administered 2013-12-23: 40 ug via INTRAVENOUS

## 2013-12-23 MED ORDER — ROCURONIUM BROMIDE 100 MG/10ML IV SOLN
INTRAVENOUS | Status: DC | PRN
  Administered 2013-12-23: 50 mg via INTRAVENOUS

## 2013-12-23 MED ORDER — LIDOCAINE HCL (CARDIAC) 20 MG/ML IV SOLN
INTRAVENOUS | Status: AC
  Filled 2013-12-23: qty 5

## 2013-12-23 MED ORDER — SUFENTANIL CITRATE 50 MCG/ML IV SOLN
INTRAVENOUS | Status: DC | PRN
  Administered 2013-12-23: 20 ug via INTRAVENOUS
  Administered 2013-12-23: 10 ug via INTRAVENOUS

## 2013-12-23 MED ORDER — BUPIVACAINE HCL (PF) 0.5 % IJ SOLN
INTRAMUSCULAR | Status: DC | PRN
  Administered 2013-12-23: 20 mL

## 2013-12-23 MED ORDER — PHENYLEPHRINE HCL 10 MG/ML IJ SOLN
10.0000 mg | INTRAVENOUS | Status: DC | PRN
  Administered 2013-12-23: 40 ug/min via INTRAVENOUS

## 2013-12-23 MED ORDER — SODIUM CHLORIDE 0.9 % IJ SOLN: 3.0000 mL | INTRAMUSCULAR | Status: DC | PRN

## 2013-12-23 MED ORDER — PROPOFOL 10 MG/ML IV BOLUS
INTRAVENOUS | Status: AC
  Filled 2013-12-23: qty 20

## 2013-12-23 MED ORDER — ACETAMINOPHEN 160 MG/5ML PO SOLN
325.0000 mg | ORAL | Status: DC | PRN
  Filled 2013-12-23: qty 20.3

## 2013-12-23 MED ORDER — HYDROCODONE-ACETAMINOPHEN 5-325 MG PO TABS: 1.0000 | ORAL_TABLET | ORAL | Status: DC | PRN

## 2013-12-23 MED ORDER — KETOROLAC TROMETHAMINE 30 MG/ML IJ SOLN: 15.0000 mg | Freq: Once | INTRAMUSCULAR | Status: DC | PRN

## 2013-12-23 MED ORDER — PHENYLEPHRINE 40 MCG/ML (10ML) SYRINGE FOR IV PUSH (FOR BLOOD PRESSURE SUPPORT)
PREFILLED_SYRINGE | INTRAVENOUS | Status: AC
  Filled 2013-12-23: qty 10

## 2013-12-23 MED ORDER — PROPOFOL 10 MG/ML IV BOLUS
INTRAVENOUS | Status: DC | PRN
  Administered 2013-12-23: 170 mg via INTRAVENOUS

## 2013-12-23 MED ORDER — MIDAZOLAM HCL 5 MG/5ML IJ SOLN
INTRAMUSCULAR | Status: DC | PRN
  Administered 2013-12-23: 2 mg via INTRAVENOUS

## 2013-12-23 MED ORDER — LACTATED RINGERS IV SOLN
INTRAVENOUS | Status: DC | PRN
  Administered 2013-12-23 (×2): via INTRAVENOUS

## 2013-12-23 MED ORDER — ONDANSETRON HCL 4 MG/2ML IJ SOLN: 4.0000 mg | Freq: Four times a day (QID) | INTRAMUSCULAR | Status: DC | PRN

## 2013-12-23 MED ORDER — KETOROLAC TROMETHAMINE 30 MG/ML IJ SOLN
30.0000 mg | Freq: Once | INTRAMUSCULAR | Status: AC
  Administered 2013-12-23: 30 mg via INTRAVENOUS

## 2013-12-23 MED ORDER — OXYCODONE-ACETAMINOPHEN 5-325 MG PO TABS
1.0000 | ORAL_TABLET | ORAL | Status: DC | PRN
  Administered 2013-12-23 – 2013-12-24 (×4): 1 via ORAL
  Filled 2013-12-23 (×4): qty 1

## 2013-12-23 MED ORDER — BISACODYL 10 MG RE SUPP: 10.0000 mg | Freq: Every day | RECTAL | Status: DC | PRN

## 2013-12-23 MED ORDER — ZOLPIDEM TARTRATE 5 MG PO TABS: 10.0000 mg | ORAL_TABLET | Freq: Every evening | ORAL | Status: DC | PRN

## 2013-12-23 MED ORDER — SERTRALINE HCL 100 MG PO TABS
100.0000 mg | ORAL_TABLET | Freq: Every day | ORAL | Status: DC | PRN
  Filled 2013-12-23: qty 1

## 2013-12-23 MED ORDER — ACETAMINOPHEN 325 MG PO TABS: 650.0000 mg | ORAL_TABLET | ORAL | Status: DC | PRN

## 2013-12-23 MED ORDER — PHENOL 1.4 % MT LIQD: 1.0000 | OROMUCOSAL | Status: DC | PRN

## 2013-12-23 MED ORDER — SODIUM CHLORIDE 0.9 % IJ SOLN
3.0000 mL | Freq: Two times a day (BID) | INTRAMUSCULAR | Status: DC
Start: 2013-12-23 — End: 2013-12-24
  Administered 2013-12-23: 3 mL via INTRAVENOUS

## 2013-12-23 MED ORDER — SUCCINYLCHOLINE CHLORIDE 20 MG/ML IJ SOLN
INTRAMUSCULAR | Status: AC
Start: 2013-12-23 — End: 2013-12-23
  Filled 2013-12-23: qty 1

## 2013-12-23 MED ORDER — MENTHOL 3 MG MT LOZG: 1.0000 | LOZENGE | OROMUCOSAL | Status: DC | PRN

## 2013-12-23 MED ORDER — SODIUM CHLORIDE 0.9 % IR SOLN
Status: DC | PRN
  Administered 2013-12-23 (×2)

## 2013-12-23 MED ORDER — OXYCODONE HCL 5 MG PO TABS: 5.0000 mg | ORAL_TABLET | Freq: Once | ORAL | Status: DC | PRN

## 2013-12-23 MED ORDER — PHENYLEPHRINE HCL 10 MG/ML IJ SOLN
INTRAMUSCULAR | Status: AC
  Filled 2013-12-23: qty 1

## 2013-12-23 MED ORDER — KETOROLAC TROMETHAMINE 30 MG/ML IJ SOLN
INTRAMUSCULAR | Status: AC
  Filled 2013-12-23: qty 1

## 2013-12-23 MED ORDER — DEXTROSE-NACL 5-0.45 % IV SOLN: INTRAVENOUS | Status: DC

## 2013-12-23 MED ORDER — KETOROLAC TROMETHAMINE 30 MG/ML IJ SOLN
30.0000 mg | Freq: Four times a day (QID) | INTRAMUSCULAR | Status: DC
Start: 2013-12-23 — End: 2013-12-24
  Administered 2013-12-23 – 2013-12-24 (×3): 30 mg via INTRAVENOUS
  Filled 2013-12-23 (×5): qty 1

## 2013-12-23 SURGICAL SUPPLY — 67 items
ADH SKN CLS APL DERMABOND .7 (GAUZE/BANDAGES/DRESSINGS) ×3
APL SKNCLS STERI-STRIP NONHPOA (GAUZE/BANDAGES/DRESSINGS)
BAG DECANTER FOR FLEXI CONT (MISCELLANEOUS) ×3 IMPLANT
BENZOIN TINCTURE PRP APPL 2/3 (GAUZE/BANDAGES/DRESSINGS) IMPLANT
BLADE SURG ROTATE 9660 (MISCELLANEOUS) ×1 IMPLANT
BRUSH SCRUB EZ PLAIN DRY (MISCELLANEOUS) ×2 IMPLANT
BUR ACORN 6.0 ACORN (BURR) IMPLANT
BUR ACRON 5.0MM COATED (BURR) ×2 IMPLANT
BUR MATCHSTICK NEURO 3.0 LAGG (BURR) ×2 IMPLANT
CANISTER SUCT 3000ML (MISCELLANEOUS) ×2 IMPLANT
CONT SPEC 4OZ CLIKSEAL STRL BL (MISCELLANEOUS) ×2 IMPLANT
DERMABOND ADVANCED (GAUZE/BANDAGES/DRESSINGS) ×3
DERMABOND ADVANCED .7 DNX12 (GAUZE/BANDAGES/DRESSINGS) IMPLANT
DRAPE LAPAROTOMY 100X72X124 (DRAPES) ×2 IMPLANT
DRAPE MICROSCOPE LEICA (MISCELLANEOUS) ×1 IMPLANT
DRAPE POUCH INSTRU U-SHP 10X18 (DRAPES) ×2 IMPLANT
DRSG EMULSION OIL 3X3 NADH (GAUZE/BANDAGES/DRESSINGS) IMPLANT
ELECT REM PT RETURN 9FT ADLT (ELECTROSURGICAL) ×2
ELECTRODE REM PT RTRN 9FT ADLT (ELECTROSURGICAL) ×1 IMPLANT
GAUZE SPONGE 4X4 16PLY XRAY LF (GAUZE/BANDAGES/DRESSINGS) IMPLANT
GLOVE BIO SURGEON STRL SZ8 (GLOVE) ×1 IMPLANT
GLOVE BIOGEL PI IND STRL 8 (GLOVE) ×1 IMPLANT
GLOVE BIOGEL PI INDICATOR 8 (GLOVE) ×2
GLOVE ECLIPSE 7.5 STRL STRAW (GLOVE) ×4 IMPLANT
GLOVE EXAM NITRILE LRG STRL (GLOVE) IMPLANT
GLOVE EXAM NITRILE MD LF STRL (GLOVE) ×1 IMPLANT
GLOVE EXAM NITRILE XL STR (GLOVE) IMPLANT
GLOVE EXAM NITRILE XS STR PU (GLOVE) IMPLANT
GOWN BRE IMP SLV AUR LG STRL (GOWN DISPOSABLE) ×1 IMPLANT
GOWN BRE IMP SLV AUR XL STRL (GOWN DISPOSABLE) IMPLANT
GOWN STRL REIN 2XL LVL4 (GOWN DISPOSABLE) IMPLANT
GOWN STRL REUS W/ TWL LRG LVL3 (GOWN DISPOSABLE) IMPLANT
GOWN STRL REUS W/ TWL XL LVL3 (GOWN DISPOSABLE) IMPLANT
GOWN STRL REUS W/TWL LRG LVL3 (GOWN DISPOSABLE) ×2
GOWN STRL REUS W/TWL XL LVL3 (GOWN DISPOSABLE) ×4
HEMOSTAT POWDER SURGIFOAM 1G (HEMOSTASIS) ×1 IMPLANT
KIT BASIN OR (CUSTOM PROCEDURE TRAY) ×2 IMPLANT
KIT ROOM TURNOVER OR (KITS) ×2 IMPLANT
NDL HYPO 18GX1.5 BLUNT FILL (NEEDLE) IMPLANT
NDL SPNL 18GX3.5 QUINCKE PK (NEEDLE) ×1 IMPLANT
NDL SPNL 22GX3.5 QUINCKE BK (NEEDLE) ×1 IMPLANT
NEEDLE HYPO 18GX1.5 BLUNT FILL (NEEDLE) IMPLANT
NEEDLE SPNL 18GX3.5 QUINCKE PK (NEEDLE) ×4 IMPLANT
NEEDLE SPNL 22GX3.5 QUINCKE BK (NEEDLE) ×4 IMPLANT
NS IRRIG 1000ML POUR BTL (IV SOLUTION) ×4 IMPLANT
PACK LAMINECTOMY NEURO (CUSTOM PROCEDURE TRAY) ×2 IMPLANT
PAD ARMBOARD 7.5X6 YLW CONV (MISCELLANEOUS) ×6 IMPLANT
PATTIES SURGICAL .5 X.5 (GAUZE/BANDAGES/DRESSINGS) ×1 IMPLANT
PATTIES SURGICAL .5 X1 (DISPOSABLE) ×2 IMPLANT
RUBBERBAND STERILE (MISCELLANEOUS) ×2 IMPLANT
SPONGE GAUZE 4X4 12PLY (GAUZE/BANDAGES/DRESSINGS) ×1 IMPLANT
SPONGE LAP 4X18 X RAY DECT (DISPOSABLE) IMPLANT
SPONGE NEURO XRAY DETECT 1X3 (DISPOSABLE) ×1 IMPLANT
SPONGE SURGIFOAM ABS GEL 100 (HEMOSTASIS) ×2 IMPLANT
STRIP CLOSURE SKIN 1/2X4 (GAUZE/BANDAGES/DRESSINGS) IMPLANT
SUT PROLENE 6 0 BV (SUTURE) IMPLANT
SUT VIC AB 1 CT1 18XBRD ANBCTR (SUTURE) ×1 IMPLANT
SUT VIC AB 1 CT1 8-18 (SUTURE) ×6
SUT VIC AB 2-0 CP2 18 (SUTURE) ×4 IMPLANT
SUT VIC AB 3-0 SH 8-18 (SUTURE) ×2 IMPLANT
SYR 20CC LL (SYRINGE) ×2 IMPLANT
SYR 20ML ECCENTRIC (SYRINGE) ×1 IMPLANT
SYR 5ML LL (SYRINGE) IMPLANT
TAPE CLOTH SURG 4X10 WHT LF (GAUZE/BANDAGES/DRESSINGS) ×1 IMPLANT
TOWEL OR 17X24 6PK STRL BLUE (TOWEL DISPOSABLE) ×2 IMPLANT
TOWEL OR 17X26 10 PK STRL BLUE (TOWEL DISPOSABLE) ×2 IMPLANT
WATER STERILE IRR 1000ML POUR (IV SOLUTION) ×2 IMPLANT

## 2013-12-23 NOTE — Progress Notes (Signed)
Filed Vitals:   12/23/13 1241 12/23/13 1315 12/23/13 1636 12/23/13 1931  BP:  125/94 118/71 126/72  Pulse: 74 77 73 75  Temp: 97.3 F (36.3 C)  97.6 F (36.4 C) 98.4 F (36.9 C)  TempSrc:    Oral  Resp: 17 16 16 18   SpO2: 97% 93% 95% 96%    Patient doing well following surgery. Up and ambulating in the halls. Dressing clean and dry. Voiding well.  Plan: Continued to progress thru postoperative recovery.  Hosie Spangle, MD 12/23/2013, 7:43 PM

## 2013-12-23 NOTE — Anesthesia Procedure Notes (Signed)
Procedure Name: Intubation Date/Time: 12/23/2013 8:39 AM Performed by: Melina Copa, Lukasz Rogus R Pre-anesthesia Checklist: Patient identified, Emergency Drugs available, Suction available, Patient being monitored and Timeout performed Patient Re-evaluated:Patient Re-evaluated prior to inductionOxygen Delivery Method: Circle system utilized Preoxygenation: Pre-oxygenation with 100% oxygen Intubation Type: IV induction Ventilation: Mask ventilation without difficulty Laryngoscope Size: Mac and 4 Grade View: Grade III Tube type: Oral Tube size: 7.5 mm Number of attempts: 1 Airway Equipment and Method: Stylet Placement Confirmation: ETT inserted through vocal cords under direct vision,  positive ETCO2 and CO2 detector Secured at: 24 cm Tube secured with: Tape Dental Injury: Teeth and Oropharynx as per pre-operative assessment

## 2013-12-23 NOTE — Anesthesia Postprocedure Evaluation (Signed)
  Anesthesia Post-op Note  Patient: Benjamin [name]  Procedure(s) Performed: Procedure(s): Lumbar Laminectomy Decompression, Lumbar Two, Three, Four, Five (N/A)  Patient Location: PACU  Anesthesia Type:General  Level of Consciousness: awake, alert  and oriented  Airway and Oxygen Therapy: Patient Spontanous Breathing and Patient connected to nasal cannula oxygen  Post-op Pain: mild  Post-op Assessment: Post-op Vital signs reviewed, Patient's Cardiovascular Status Stable, Respiratory Function Stable, Patent Airway, No signs of Nausea or vomiting and Pain level controlled  Post-op Vital Signs: Reviewed and stable  Complications: No apparent anesthesia complications

## 2013-12-23 NOTE — Preoperative (Signed)
Beta Blockers   Reason not to administer Beta Blockers:Not Applicable 

## 2013-12-23 NOTE — Anesthesia Preprocedure Evaluation (Addendum)
Anesthesia Evaluation  Patient identified by MRN, date of birth, ID band Patient awake    Reviewed: Allergy & Precautions, H&P , NPO status , Patient's Chart, lab work & pertinent test results  History of Anesthesia Complications Negative for: history of anesthetic complications  Airway Mallampati: II TM Distance: >3 FB Neck ROM: Full    Dental  (+) Teeth Intact, Dental Advisory Given   Pulmonary neg pulmonary ROS, neg sleep apnea, neg COPDneg recent URI,    Pulmonary exam normal       Cardiovascular - angina- CAD, - Past MI and - CHF - dysrhythmias - Valvular Problems/MurmursRhythm:Regular Rate:Normal     Neuro/Psych Seizures -, Well Controlled,  Anxiety Spinal stenosis, last seizure > 3 years ago, took Keppra     GI/Hepatic negative GI ROS, Neg liver ROS,   Endo/Other  negative endocrine ROS  Renal/GU negative Renal ROS  negative genitourinary   Musculoskeletal   Abdominal (+) + obese,   Peds  Hematology negative hematology ROS (+)   Anesthesia Other Findings   Reproductive/Obstetrics                          Anesthesia Physical Anesthesia Plan  ASA: II  Anesthesia Plan: General   Post-op Pain Management:    Induction: Intravenous  Airway Management Planned: Oral ETT  Additional Equipment: None  Intra-op Plan:   Post-operative Plan: Extubation in OR  Informed Consent: I have reviewed the patients History and Physical, chart, labs and discussed the procedure including the risks, benefits and alternatives for the proposed anesthesia with the patient or authorized representative who has indicated his/her understanding and acceptance.   Dental advisory given  Plan Discussed with: CRNA, Surgeon and Anesthesiologist  Anesthesia Plan Comments:        Anesthesia Quick Evaluation

## 2013-12-23 NOTE — Op Note (Signed)
12/23/2013  11:55 AM  PATIENT:  Benjamin [name]  63 y.o. male  PRE-OPERATIVE DIAGNOSIS:  lumbar stenosis with neurogenic claudication, lumbar spondylosis, lumbar degenerative disease  POST-OPERATIVE DIAGNOSIS:  lumbar stenosis with neurogenic claudication, lumbar spondylosis, lumbar degenerative disc disease  PROCEDURE:  Procedure(s):  L2, L3, L4, and L5 decompressive lumbar laminectomy, with decompression of the L2, L3, L4, and L5 nerve roots bilaterally  SURGEON:  Surgeon(s): Hosie Spangle, MD Eustace Moore, MD  ASSISTANTS: Sherley Bounds, M.D.  ANESTHESIA:   general  EBL:  Total I/O In: 1000 [I.V.:1000] Out: 150 [Blood:150]  BLOOD ADMINISTERED:none  COUNT: Correct per nursing staff  DICTATION: Patient was brought to the operating room placed under general endotracheal anesthesia. Patient was turned to a prone position the lumbar region was prepped with Betadine soap and solution and draped in a sterile fashion. The midline was infiltrated with local anesthetic with epinephrine. A localizing x-ray was taken and then a midline incision was made carried down thru the subcutaneous tissue, bipolar cautery and electrocautery were used to maintain hemostasis. Dissection was carried down to the lumbar fascia which was incised bilaterally and the paraspinal muscles were dissected from the spinous process and lamina in a subperiosteal fashion. Another localizing x-ray was taken and the L2, L3, L4, and L5 levels were identified. Laminectomy was begun with double-action rongeurs a high-speed drill and Kerrison punches. The thickened ligamentum flavum was carefully removed. Dissection was carried laterally to decompress the lateral stenosis taking care to leave the facet complexes intact. Once the decompression was completed hemostasis was established with the use of bipolar cautery, Gelfoam with thrombin, and Surgifoam. Paraspinal muscles, deep fascia, and Scarpa's fascia were closed in separate  layers with interrupted 1 undyed Vicryl sutures. The subcutaneous and subcuticular were closed with interrupted inverted 2-0 undyed Vicryl sutures. Skin edges were approximated with Dermabond.  A dressing of sterile gauze and Hypafix was applied.  PLAN OF CARE: Admit to inpatient   PATIENT DISPOSITION:  PACU - hemodynamically stable.   Delay start of Pharmacological VTE agent (>24hrs) due to surgical blood loss or risk of bleeding:  yes

## 2013-12-23 NOTE — Progress Notes (Signed)
Utilization review completed.  

## 2013-12-23 NOTE — H&P (Signed)
Subjective: Patient is a 63 y.o. male who is admitted for treatment of multilevel multifactorial lumbar stenosis. He has symptoms of back pain which may be partially from his stenosis, but also may be from his multiple thoracic and lumbar compression fractures. Symptomatically he has claudication at its felt that that is primarily due to his stenosis and therefore he is admitted for an L2-L5 decompressive lumbar laminectomy.   Patient Active Problem List   Diagnosis Date Noted  . Other forms of epilepsy and recurrent seizures without mention of intractable epilepsy 09/16/2013  . Prostate cancer 07/03/2012   Past Medical History  Diagnosis Date  . Seizures   . Prostate cancer 07/03/12    Adenocarcinoma,gleason=3+3=6,PSA=5.0,volume=20cc  . Allergy   . Anxiety   . Depression   . Hypercholesterolemia   . Acute prostatitis 07/03/12    s/p prostate   . Back pain   . BPH (benign prostatic hypertrophy) with urinary obstruction   . ED (erectile dysfunction)   . Arthritis     stenosis lower back  . History of ITP     as a child per patient  . Other forms of epilepsy and recurrent seizures without mention of intractable epilepsy 09/16/2013  . Headache(784.0)     "sinus" headaches    Past Surgical History  Procedure Laterality Date  . Toe surgery    . Colonoscopy      Prescriptions prior to admission  Medication Sig Dispense Refill  . acetaminophen (TYLENOL) 325 MG tablet Take 650 mg by mouth every 6 (six) hours as needed for mild pain.       Marland Kitchen acetaminophen (TYLENOL) 500 MG tablet Take 1,000 mg by mouth every 6 (six) hours as needed for mild pain. Pain      . fish oil-omega-3 fatty acids 1000 MG capsule Take 2 g by mouth daily as needed (triglicerides are high).       . levETIRAcetam (KEPPRA) 750 MG tablet Take 1 tablet (750 mg total) by mouth every 12 (twelve) hours.  180 tablet  3  . sertraline (ZOLOFT) 100 MG tablet Take 100 mg by mouth daily as needed (when feeling down).         Allergies  Allergen Reactions  . Aspartame And Phenylalanine     Powder causes seizures  . Benadryl [Diphenhydramine]     seizures    History  Substance Use Topics  . Smoking status: Never Smoker   . Smokeless tobacco: Former Systems developer    Types: Chew  . Alcohol Use: Yes     Comment: social beer occasionally    Family History  Problem Relation Age of Onset  . Cancer Father 51    prostate/prostatectomy  . Cancer Cousin 54    prostate ca, also heart problems     Review of Systems A comprehensive review of systems was negative.  Objective: Vital signs in last 24 hours: Temp:  [96.8 F (36 C)] 96.8 F (36 C) (02/11 0642) Pulse Rate:  [63] 63 (02/11 0642) Resp:  [20] 20 (02/11 0642) BP: (134)/(88) 134/88 mmHg (02/11 0642) SpO2:  [98 %] 98 % (02/11 0642)  EXAM: She is a well-developed well-nourished white male in no acute distress. Lungs are clear to auscultation , the patient has symmetrical respiratory excursion. Heart has a regular rate and rhythm normal S1 and S2 no murmur.   Abdomen is soft nontender nondistended bowel sounds are present. Extremity examination shows no clubbing cyanosis or edema. Motor examination shows 5 over 5 strength in the  lower extremities including the iliopsoas quadriceps dorsiflexor extensor hallicus  longus and plantar flexor bilaterally. Sensation is intact to pinprick in the distal lower extremities. Reflexes are symmetrical bilaterally. No pathologic reflexes are present. Patient has a normal gait and stance.   Data Review:CBC    Component Value Date/Time   WBC 7.0 12/15/2013 0841   RBC 4.84 12/15/2013 0841   HGB 15.3 12/15/2013 0841   HCT 42.9 12/15/2013 0841   PLT 172 12/15/2013 0841   MCV 88.6 12/15/2013 0841   MCH 31.6 12/15/2013 0841   MCHC 35.7 12/15/2013 0841   RDW 13.0 12/15/2013 0841                          BMET    Component Value Date/Time   NA 141 12/15/2013 0853   K 4.7 12/15/2013 0853   CL 105 12/15/2013 0853   CO2 24 12/15/2013 0853    GLUCOSE 103* 12/15/2013 0853   BUN 10 12/15/2013 0853   CREATININE 0.89 12/15/2013 0853   CALCIUM 8.8 12/15/2013 0853   GFRNONAA >90 12/15/2013 0853   GFRAA >90 12/15/2013 6606     Assessment/Plan: Patient with neurogenic claudication secondary to multilevel multifactorial lumbar stenosis. He is admitted now for decompressive lumbar laminectomy.  I've discussed with the patient the nature of his condition, the nature the surgical procedure, the typical length of surgery, hospital stay, and overall recuperation. We discussed limitations postoperatively. I discussed risks of surgery including risks of infection, bleeding, possibly need for transfusion, the risk of nerve root dysfunction with pain, weakness, numbness, or paresthesias, or risk of dural tear and CSF leakage and possible need for further surgery, and the risk of anesthetic complications including myocardial infarction, stroke, pneumonia, and death. Understanding all this the patient does wish to proceed with surgery and is admitted for such.    Hosie Spangle, MD 12/23/2013 8:07 AM

## 2013-12-23 NOTE — Transfer of Care (Signed)
Immediate Anesthesia Transfer of Care Note  Patient: Benjamin [name]  Procedure(s) Performed: Procedure(s): Lumbar Laminectomy Decompression, Lumbar Two, Three, Four, Five (N/A)  Patient Location: PACU  Anesthesia Type:General  Level of Consciousness: sedated  Airway & Oxygen Therapy: Patient Spontanous Breathing and Patient connected to nasal cannula oxygen  Post-op Assessment: Report given to PACU RN, Post -op Vital signs reviewed and stable and Patient moving all extremities  Post vital signs: Reviewed and stable  Complications: No apparent anesthesia complications

## 2013-12-24 ENCOUNTER — Encounter (HOSPITAL_COMMUNITY): Payer: Self-pay | Admitting: Neurosurgery

## 2013-12-24 MED ORDER — OXYCODONE-ACETAMINOPHEN 5-325 MG PO TABS: 0.5000 | ORAL_TABLET | ORAL | Status: DC | PRN

## 2013-12-24 NOTE — Discharge Instructions (Signed)

## 2013-12-24 NOTE — Progress Notes (Signed)
Pt and wife given D/C instructions with Rx, verbal understanding was given. Pt D/C'd home via walking with family per MD order @ 1105. Pt was stable @ D/C and had no other needs. Holli Humbles, RN

## 2013-12-24 NOTE — Discharge Summary (Signed)
Physician Discharge Summary  Patient ID: Benjamin [name] MRN: [id number] DOB/AGE: [birth year] 63 y.o.  Admit date: 12/23/2013 Discharge date: 12/24/2013  Admission Diagnoses:  lumbar stenosis with neurogenic claudication, lumbar spondylosis, lumbar degenerative disease  Discharge Diagnoses:  lumbar stenosis with neurogenic claudication, lumbar spondylosis, lumbar degenerative disease  Active Problems:   Lumbar stenosis with neurogenic claudication  Discharged Condition: good  Hospital Course: Patient was admitted, underwent an L2, L3, L4, L5 decompressive lumbar laminectomy. He is done well following surgery. He is up and living in the halls. He is voiding well. His bandage was removed and his incision is healing nicely. He's been given instructions regarding wound care and activities following discharge. He is to return for followup with me in 3 weeks.  Discharge Exam: Blood pressure 111/70, pulse 75, temperature 99.5 F (37.5 C), temperature source Oral, resp. rate 20, SpO2 94.00%.  Disposition: Home   Future Appointments Provider Department Dept Phone   09/16/2014 1:30 PM Philmore Pali, NP [facility] [phone]       Medication List         acetaminophen 325 MG tablet  Commonly known as:  TYLENOL  Take 650 mg by mouth every 6 (six) hours as needed for mild pain.     acetaminophen 500 MG tablet  Commonly known as:  TYLENOL  Take 1,000 mg by mouth every 6 (six) hours as needed for mild pain. Pain     fish oil-omega-3 fatty acids 1000 MG capsule  Take 2 g by mouth daily as needed (triglicerides are high).     levETIRAcetam 750 MG tablet  Commonly known as:  KEPPRA  Take 1 tablet (750 mg total) by mouth every 12 (twelve) hours.     oxyCODONE-acetaminophen 5-325 MG per tablet  Commonly known as:  PERCOCET/ROXICET  Take 0.5-1 tablets by mouth every 4 (four) hours as needed for moderate pain.     sertraline 100 MG tablet  Commonly known as:  ZOLOFT  Take  100 mg by mouth daily as needed (when feeling down).         Signed: Hosie Spangle, MD 12/24/2013, 10:36 AM

## 2014-07-07 ENCOUNTER — Encounter: Payer: Self-pay | Admitting: Neurology

## 2014-07-07 ENCOUNTER — Ambulatory Visit (INDEPENDENT_AMBULATORY_CARE_PROVIDER_SITE_OTHER): Payer: 59 | Admitting: Neurology

## 2014-07-07 VITALS — BP 120/80 | HR 67 | Resp 12 | Ht 71.25 in | Wt 220.0 lb

## 2014-07-07 DIAGNOSIS — G40802 Other epilepsy, not intractable, without status epilepticus: Secondary | ICD-10-CM

## 2014-07-07 DIAGNOSIS — G609 Hereditary and idiopathic neuropathy, unspecified: Secondary | ICD-10-CM

## 2014-07-07 HISTORY — DX: Hereditary and idiopathic neuropathy, unspecified: G60.9

## 2014-07-07 MED ORDER — LEVETIRACETAM 750 MG PO TABS: 750.0000 mg | ORAL_TABLET | Freq: Two times a day (BID) | ORAL | Status: DC

## 2014-07-07 NOTE — Patient Instructions (Signed)
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
## 2014-07-08 ENCOUNTER — Encounter: Payer: 59 | Admitting: Diagnostic Neuroimaging

## 2014-07-08 ENCOUNTER — Encounter: Payer: 59 | Admitting: Radiology

## 2014-07-09 LAB — NEUROPATHY PANEL
A/G Ratio: 1.4 (ref 0.7–2.0)
ALBUMIN ELP: 3.8 g/dL (ref 3.2–5.6)
ALPHA 1: 0.2 g/dL (ref 0.1–0.4)
ALPHA 2: 0.8 g/dL (ref 0.4–1.2)
ANGIO CONVERT ENZYME: 47 U/L (ref 14–82)
Anti Nuclear Antibody(ANA): NEGATIVE
Beta: 0.8 g/dL (ref 0.6–1.3)
GAMMA GLOBULIN: 0.9 g/dL (ref 0.5–1.6)
Globulin, Total: 2.7 g/dL (ref 2.0–4.5)
RHEUMATOID FACTOR: 8.3 [IU]/mL (ref 0.0–13.9)
Sed Rate: 4 mm/hr (ref 0–30)
TSH: 1.51 u[IU]/mL (ref 0.450–4.500)
Total Protein: 6.5 g/dL (ref 6.0–8.5)
Vit D, 25-Hydroxy: 32.3 ng/mL (ref 30.0–100.0)
Vitamin B-12: 354 pg/mL (ref 211–946)

## 2014-07-29 ENCOUNTER — Encounter (INDEPENDENT_AMBULATORY_CARE_PROVIDER_SITE_OTHER): Payer: Self-pay

## 2014-07-29 ENCOUNTER — Ambulatory Visit (INDEPENDENT_AMBULATORY_CARE_PROVIDER_SITE_OTHER): Payer: 59 | Admitting: Neurology

## 2014-07-29 ENCOUNTER — Telehealth: Payer: Self-pay | Admitting: Neurology

## 2014-07-29 DIAGNOSIS — Z0289 Encounter for other administrative examinations: Secondary | ICD-10-CM

## 2014-07-29 DIAGNOSIS — G609 Hereditary and idiopathic neuropathy, unspecified: Secondary | ICD-10-CM

## 2014-07-29 NOTE — Procedures (Signed)
   NCS (NERVE CONDUCTION STUDY) WITH EMG (ELECTROMYOGRAPHY) REPORT   STUDY DATE: September 17th 2015  PATIENT NAME: Benjamin [name] DOB: [birth year] MRN: [id number]    TECHNOLOGIST: Laretta Alstrom ELECTROMYOGRAPHER: Marcial Pacas M.D.  CLINICAL INFORMATION:  63 years old male, with past medical history of lumbar stenosis,T1 fracture, presenting with few years history of bilateral feet paresthesia  On examination: There was no weakness notice, mild length dependent decreased pinprick to ankle level, absent ankle reflexes.  FINDINGS: NERVE CONDUCTION STUDY: Bilateral peroneal sensory responses were present, with a low normal range snap amplitude. Bilateral peroneal motor responses were normal. Bilateral tibial motor responses showed mild to moderately decreased C. map amplitude. Bilateral tibial H. reflexes were absent.   Right median, ulnar sensory and motor responses were normal.   NEEDLE ELECTROMYOGRAPHY:  Selected needle examination was performed at bilateral lower extremity muscles, right lumbosacral paraspinal muscles.  Needle examination of bilateral tibialis anterior, medial gastrocnemius, peroneal longus, vastus lateralis was normal  There was well healed mid lumbar scar, there was increased insertion activity, 2-3 plus spontaneous activity at the right L4, L5.  IMPRESSION:   This is an abnormal study. There is electrodiagnostic evidence of slight length dependent axonal peripheral neuropathy. There is no evidence of right lumbar radiculopathy.   INTERPRETING PHYSICIAN:   Marcial Pacas M.D. Ph.D. [facility] [address], [place], [place] [address] [phone]

## 2014-07-29 NOTE — Telephone Encounter (Signed)
Patient has completed his NCV/EMG and would like to know if he can come in early to see Dr. Brett Fairy

## 2014-07-29 NOTE — Telephone Encounter (Signed)
Message sent to Dr. Brett Fairy, patient had EMg done on today.

## 2014-07-30 NOTE — Telephone Encounter (Signed)
To MR Lucks question- Yes, whatever slot is open on my calendar , please fill. CD

## 2014-08-02 NOTE — Telephone Encounter (Signed)
Left a voice message to call the office and schedule an office visit with Dr. Brett Fairy.

## 2014-08-03 NOTE — Telephone Encounter (Signed)
Patient has scheduled for 08/06/2014 to see Dr. Brett Fairy.

## 2014-08-06 ENCOUNTER — Encounter: Payer: Self-pay | Admitting: Neurology

## 2014-08-06 ENCOUNTER — Encounter (INDEPENDENT_AMBULATORY_CARE_PROVIDER_SITE_OTHER): Payer: Self-pay

## 2014-08-06 ENCOUNTER — Ambulatory Visit (INDEPENDENT_AMBULATORY_CARE_PROVIDER_SITE_OTHER): Payer: 59 | Admitting: Neurology

## 2014-08-06 VITALS — BP 124/81 | HR 65 | Resp 14 | Ht 71.75 in | Wt 225.0 lb

## 2014-08-06 DIAGNOSIS — G6289 Other specified polyneuropathies: Secondary | ICD-10-CM

## 2014-08-06 DIAGNOSIS — G608 Other hereditary and idiopathic neuropathies: Secondary | ICD-10-CM

## 2014-08-06 DIAGNOSIS — G40802 Other epilepsy, not intractable, without status epilepticus: Secondary | ICD-10-CM

## 2014-08-06 HISTORY — DX: Other specified polyneuropathies: G62.89

## 2014-08-06 MED ORDER — VITAMIN D (ERGOCALCIFEROL) 1.25 MG (50000 UNIT) PO CAPS: 50000.0000 [IU] | ORAL_CAPSULE | ORAL | Status: DC

## 2014-08-06 MED ORDER — LEVETIRACETAM 750 MG PO TABS: 750.0000 mg | ORAL_TABLET | Freq: Two times a day (BID) | ORAL | Status: DC
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
## 2014-08-13 ENCOUNTER — Other Ambulatory Visit: Payer: Self-pay | Admitting: Neurology

## 2014-09-13 ENCOUNTER — Telehealth: Payer: Self-pay | Admitting: Neurology

## 2014-09-13 NOTE — Telephone Encounter (Signed)
Not necessary,  may keep his once a year appointment. CD

## 2014-09-13 NOTE — Telephone Encounter (Signed)
Patient calling to check whether his 09/16/14 appointment with Benjamin [name] is necessary because he just saw Dr. Brett Fairy on 08/06/14. Please return call and advise.

## 2014-09-14 NOTE — Telephone Encounter (Signed)
I spoke to pt and relayed once yearly appt is ok.  Keppra rx was done when in last 08-06-14.  Pt verbalized understanding.

## 2014-09-16 ENCOUNTER — Ambulatory Visit: Payer: 59 | Admitting: Nurse Practitioner

## 2015-08-01 ENCOUNTER — Telehealth: Payer: Self-pay

## 2015-08-01 NOTE — Telephone Encounter (Signed)
Called pt to find out if he would be ok seeing Jinny Blossom, NP.  Pt said he would call us back tomorrow. If pt calls back and is agreeable, please cancel the appt with Dr. Brett Fairy and add him to Megan's schedule.

## 2015-08-10 ENCOUNTER — Encounter: Payer: Self-pay | Admitting: Adult Health

## 2015-08-10 ENCOUNTER — Ambulatory Visit (INDEPENDENT_AMBULATORY_CARE_PROVIDER_SITE_OTHER): Payer: 59 | Admitting: Adult Health

## 2015-08-10 ENCOUNTER — Ambulatory Visit: Payer: 59 | Admitting: Neurology

## 2015-08-10 VITALS — BP 123/73 | HR 76 | Ht 71.0 in | Wt 223.0 lb

## 2015-08-10 DIAGNOSIS — R413 Other amnesia: Secondary | ICD-10-CM | POA: Diagnosis not present

## 2015-08-10 DIAGNOSIS — R569 Unspecified convulsions: Secondary | ICD-10-CM | POA: Diagnosis not present

## 2015-08-10 MED ORDER — LEVETIRACETAM 750 MG PO TABS: 750.0000 mg | ORAL_TABLET | Freq: Two times a day (BID) | ORAL | Status: DC

## 2015-08-10 NOTE — Progress Notes (Signed)
I agree with the assessment and plan as directed by NP .The patient is known to me .   DOHMEIER,CARMEN, MD  

## 2015-08-10 NOTE — Progress Notes (Signed)
PATIENT: Benjamin [name] DOB: 24-Mar-1951  REASON FOR VISIT: follow up- seizures HISTORY FROM: patient  HISTORY OF PRESENT ILLNESS: [name] is a 64 year old male with a history of seizures. He returns today for an evaluation. He is currently taking Keppra 750 mg twice a day. He is tolerating this medication well. He has not had any additional seizure events. He operates a Teacher, music without difficulty. He is able to complete all ADLs independent. He works as a Dispensing optician. The patient has noticed some difficulty with his memory. He states that he tends to forget names. Occasionally he will forget what he was going to do but he is eventually able to recall it. Denies any other symptoms regarding his memory. He returns today for an evaluation.  REVIEW OF SYSTEMS: Out of a complete 14 system review of symptoms, the patient complains only of the following symptoms, and all other reviewed systems are negative.  Memory loss, back pain  ALLERGIES: Allergies  Allergen Reactions  . Aspartame And Phenylalanine     Powder causes seizures  . Benadryl [Diphenhydramine]     seizures    HOME MEDICATIONS: Outpatient Prescriptions Prior to Visit  Medication Sig Dispense Refill  . fish oil-omega-3 fatty acids 1000 MG capsule Take 2 g by mouth daily as needed (triglicerides are high).     . levETIRAcetam (KEPPRA) 750 MG tablet Take 1 tablet (750 mg total) by mouth every 12 (twelve) hours. 180 tablet 3  . sertraline (ZOLOFT) 100 MG tablet Take 100 mg by mouth daily as needed (when feeling down).     . Vitamin D, Ergocalciferol, (DRISDOL) 50000 UNITS CAPS capsule Take 1 capsule (50,000 Units total) by mouth every 7 (seven) days. 30 capsule 1  . acetaminophen (TYLENOL) 325 MG tablet Take 650 mg by mouth every 6 (six) hours as needed for mild pain.     Marland Kitchen acetaminophen (TYLENOL) 500 MG tablet Take 1,000 mg by mouth every 6 (six) hours as needed for mild pain. Pain    . levETIRAcetam (KEPPRA) 750 MG  tablet TAKE 1 TABLET BY MOUTH EVERY 12 HOURS (Patient not taking: Reported on 08/10/2015) 180 tablet 3   No facility-administered medications prior to visit.    PAST MEDICAL HISTORY: Past Medical History  Diagnosis Date  . Seizures   . Prostate cancer 07/03/12    Adenocarcinoma,gleason=3+3=6,PSA=5.0,volume=20cc  . Allergy   . Anxiety   . Depression   . Hypercholesterolemia   . Acute prostatitis 07/03/12    s/p prostate   . Back pain   . BPH (benign prostatic hypertrophy) with urinary obstruction   . ED (erectile dysfunction)   . Arthritis     stenosis lower back  . History of ITP     as a child per patient  . Other forms of epilepsy and recurrent seizures without mention of intractable epilepsy 09/16/2013  . Headache(784.0)     "sinus" headaches  . Unspecified hereditary and idiopathic peripheral neuropathy 07/07/2014    Foot numbness starting 2010, hands beginning in 2014- 15 .  Marland Kitchen Peripheral axonal neuropathy 08/06/2014    PAST SURGICAL HISTORY: Past Surgical History  Procedure Laterality Date  . Toe surgery    . Colonoscopy    . Lumbar laminectomy/decompression microdiscectomy N/A 12/23/2013    Procedure: Lumbar Laminectomy Decompression, Lumbar Two, Three, Four, Five;  Surgeon: Hosie Spangle, MD;  Location: [facility];  Service: Neurosurgery;  Laterality: N/A;    FAMILY HISTORY: Family History  Problem Relation Age of  Onset  . Cancer Father 13    prostate/prostatectomy  . Cancer Cousin 76    prostate ca, also heart problems    SOCIAL HISTORY: Social History   Social History  . Marital Status: Married    Spouse Name: Benjamin [name]  . Number of Children: 1  . Years of Education: Asso x 2   Occupational History  .  [employer]   Social History Main Topics  . Smoking status: Never Smoker   . Smokeless tobacco: Never Used  . Alcohol Use: Yes     Comment: social beer occasionally  . Drug Use: No     Comment: chewed in high school short time  . Sexual  Activity: Yes   Other Topics Concern  . Not on file   Social History Narrative   Patient is married Benjamin [name]) and lives at home with his wife and 1 child and a step-child.   Patient is working full-time.   Patient has two Associate degrees.   Patient is right-handed.   Patient drinks four cups of tea daily.      PHYSICAL EXAM  Filed Vitals:   08/10/15 1250  BP: 123/73  Pulse: 76  Height: 5\' 11"  (1.803 m)  Weight: 223 lb (101.152 kg)   Body mass index is 31.12 kg/(m^2).  MMSE - Mini Mental State Exam 08/10/2015  Orientation to time 5  Orientation to Place 5  Registration 3  Attention/ Calculation 5  Recall 3  Language- name 2 objects 2  Language- repeat 1  Language- follow 3 step command 2  Language- read & follow direction 1  Write a sentence 1  Copy design 1  Total score 29     Generalized: Well developed, in no acute distress   Neurological examination  Mentation: Alert oriented to time, place, history taking. Follows all commands speech and language fluent Cranial nerve II-XII: Pupils were equal round reactive to light. Extraocular movements were full, visual field were full on confrontational test. Facial sensation and strength were normal. Uvula tongue midline. Head turning and shoulder shrug  were normal and symmetric. Motor: The motor testing reveals 5 over 5 strength of all 4 extremities. Good symmetric motor tone is noted throughout.  Sensory: Sensory testing is intact to soft touch on all 4 extremities. No evidence of extinction is noted.  Coordination: Cerebellar testing reveals good finger-nose-finger and heel-to-shin bilaterally.  Gait and station: Gait is normal. Tandem gait is normal. Romberg is negative. No drift is seen.  Reflexes: Deep tendon reflexes are symmetric and normal bilaterally.   DIAGNOSTIC DATA (LABS, IMAGING, TESTING) - I reviewed patient records, labs, notes, testing and imaging myself where available.  Lab Results  Component Value  Date   WBC 7.0 12/15/2013   HGB 15.3 12/15/2013   HCT 42.9 12/15/2013   MCV 88.6 12/15/2013   PLT 172 12/15/2013      Component Value Date/Time   NA 141 12/15/2013 0853   K 4.7 12/15/2013 0853   CL 105 12/15/2013 0853   CO2 24 12/15/2013 0853   GLUCOSE 103* 12/15/2013 0853   BUN 10 12/15/2013 0853   CREATININE 0.89 12/15/2013 0853   CALCIUM 8.8 12/15/2013 0853   PROT 6.5 07/07/2014 0943   GFRNONAA >90 12/15/2013 0853   GFRAA >90 12/15/2013 0853    ASSESSMENT AND PLAN 64 y.o. year old male  has a past medical history of Seizures; Prostate cancer (07/03/12); Allergy; Anxiety; Depression; Hypercholesterolemia; Acute prostatitis (07/03/12); Back pain; BPH (benign prostatic hypertrophy) with  urinary obstruction; ED (erectile dysfunction); Arthritis; History of ITP; Other forms of epilepsy and recurrent seizures without mention of intractable epilepsy (09/16/2013); Headache(784.0); Unspecified hereditary and idiopathic peripheral neuropathy (07/07/2014); and Peripheral axonal neuropathy (08/06/2014). here with:  1. Seizures  2. Memory disturbance  Overall the patient is doing well. He will continue on Keppra. If he has any seizure events he should let us know. [name] MMSE is 29/30. We will continue to monitor his memory over time. If his symptoms worsen or he develops any new symptoms he should let us know. He will follow-up in one year or sooner if needed.  Ward Givens, MSN, NP-C 08/10/2015, 1:05 PM [facility] [address] [place], [place] [address] [phone]

## 2015-08-10 NOTE — Patient Instructions (Signed)
Continue Keppra  If you have any seizures please let us know.

## 2015-08-25 ENCOUNTER — Other Ambulatory Visit: Payer: Self-pay | Admitting: Neurology

## 2015-11-13 HISTORY — PX: KNEE ARTHROSCOPY: SUR90

## 2016-02-07 ENCOUNTER — Ambulatory Visit (HOSPITAL_COMMUNITY)
Admission: RE | Admit: 2016-02-07 | Discharge: 2016-02-07 | Disposition: A | Discharge status: Home / Self Care | Payer: 59 | Source: Ambulatory Visit | Attending: Orthopaedic Surgery | Admitting: Orthopaedic Surgery

## 2016-02-07 ENCOUNTER — Other Ambulatory Visit (HOSPITAL_COMMUNITY): Payer: Self-pay | Admitting: Orthopaedic Surgery

## 2016-02-07 DIAGNOSIS — M25561 Pain in right knee: Secondary | ICD-10-CM

## 2016-02-07 DIAGNOSIS — X58XXXA Exposure to other specified factors, initial encounter: Secondary | ICD-10-CM | POA: Insufficient documentation

## 2016-02-07 DIAGNOSIS — S83231A Complex tear of medial meniscus, current injury, right knee, initial encounter: Secondary | ICD-10-CM | POA: Diagnosis not present

## 2016-02-07 DIAGNOSIS — M1711 Unilateral primary osteoarthritis, right knee: Secondary | ICD-10-CM | POA: Diagnosis not present

## 2016-04-16 ENCOUNTER — Other Ambulatory Visit: Payer: Self-pay | Admitting: Urology

## 2016-04-19 ENCOUNTER — Ambulatory Visit: Payer: 59 | Attending: Urology | Admitting: Physical Therapy

## 2016-04-19 ENCOUNTER — Encounter: Payer: Self-pay | Admitting: Physical Therapy

## 2016-04-19 DIAGNOSIS — M6281 Muscle weakness (generalized): Secondary | ICD-10-CM

## 2016-04-19 DIAGNOSIS — M25561 Pain in right knee: Secondary | ICD-10-CM | POA: Insufficient documentation

## 2016-04-19 DIAGNOSIS — Z483 Aftercare following surgery for neoplasm: Secondary | ICD-10-CM

## 2016-04-19 DIAGNOSIS — R600 Localized edema: Secondary | ICD-10-CM | POA: Diagnosis present

## 2016-04-19 NOTE — Patient Instructions (Addendum)
Certain foods and liquids will decrease the pH making the urine more acidic.  Urinary urgency increases when the urine has a low pH.  Most common irritants: alcohol, carbonated beverages and caffinated beverages.  Foods to avoid: apple juice, apples, ascorbic acid, canteloupes, chili, citrus fruits, coffee, cranberries, grapes, guava, peaches, pepper, pineapple, plums, strawberries, tea, tomatoes, and vinegar.  Drinking plenty of water may help to increase the pH and dilute out any of the effects of specific irritants.  Foods that are NOT irritating to the bladder include: Pears, papayas, sun-brewed teas, watermelons, non-citrus herbal teas, apricots, kava and low-acid instant drinks (Postum)   Wiesner Incontinence Clamp Penile Clamp Regular (FREE Pouch Bag)    [employer] / [employer]rological Cunningham Incontinence Clamp Penile Clamp size 2/regular  by [employer] / Urological Cunningham Incontinence Clamp Penile Clamp size 2/regular  by [employer]   ActiCuf Compression Pouch for Male Urinary Incontinence; One Pack of 10 Pouches   Mens depends    Quick Contraction: Gravity Resisted (Sitting)    Sitting, quickly squeeze then fully relax pelvic floor. Perform _5__ sets of _1__. Rest for _1__ seconds between sets. Do _3__ times a day.  Copyright  VHI. All rights reserved.    Slow Contraction: Gravity Resisted (Sitting)    Sitting, slowly squeeze pelvic floor for _10__ seconds. Rest for _5__ seconds. Repeat _10__ times. Do _3__ times a day.  Copyright  VHI. All rights reserved.  Gluteal Squeeze    Squeeze buttocks muscles as tightly as possible while counting out loud to _10___. Repeat _10___ times. Do _2___ sessions per day.  http://gt2.exer.us/363   Copyright  VHI. All rights reserved.      Sitting    Sit comfortably. Allow bodys muscles to relax. Place hands on belly. Inhale slowly and deeply for _3__ seconds, so  hands move out. Then take 3___ seconds to exhale. Repeat _5__ times. Do _3__ times a day.  Copyright  VHI. All rights reserved.   Toileting Techniques for Bowel Movements (Defecation) Using your belly (abdomen) and pelvic floor muscles to have a bowel movement is usually instinctive.  Sometimes people can have problems with these muscles and have to relearn proper defecation (emptying) techniques.  If you have weakness in your muscles, organs that are falling out, decreased sensation in your pelvis, or ignore your urge to go, you may find yourself straining to have a bowel movement.  You are straining if you are:  holding your breath or taking in a huge gulp of air and holding it   keeping your lips and jaw tensed and closed tightly  turning red in the face because of excessive pushing or forcing  developing or worsening your  hemorrhoids  getting faint while pushing  not emptying completely and have to defecate many times a day  If you are straining, you are actually making it harder for yourself to have a bowel movement.  Many people find they are pulling up with the pelvic floor muscles and closing off instead of opening the anus. Due to lack pelvic floor relaxation and coordination the abdominal muscles, one has to work harder to push the feces out.  Many people have never been taught how to defecate efficiently and effectively.  Notice what happens to your body when you are having a bowel movement.  While you are sitting on the toilet pay attention to the following areas:  Jaw and mouth position  Angle of your hips  Whether your feet touch the ground or not  Arm placement   Spine position  Waist  Belly tension  Anus (opening of the anal canal)  An Evacuation/Defecation Plan   Here are the 4 basic points:  1. Lean forward enough for your elbows to rest on your knees 2. Support your feet on the floor or use a low stool if your feet dont touch the floor  3. Push out  your belly as if you have swallowed a beach ball--you should feel a widening of your waist 4. Open and relax your pelvic floor muscles, rather than tightening around the anus      The following conditions my require modifications to your toileting posture:   If you have had surgery in the past that limits your back, hip, pelvic, knee or ankle flexibility  Constipation   Your healthcare practitioner may make the following additional suggestions and adjustments:  1) Sit on the toilet  a) Make sure your feet are supported. b) Notice your hip angle and spine position--most people find it effective to lean forward or raise their knees, which can help the muscles around the anus to relax  c) When you lean forward, place your forearms on your thighs for support  2) Relax suggestions a) Breath deeply in through your nose and out slowly through your mouth as if you are smelling the flowers and blowing out the candles. b) To become aware of how to relax your muscles, contracting and releasing muscles can be helpful.  Pull your pelvic floor muscles in tightly by using the image of holding back gas, or closing around the anus (visualize making a circle smaller) and lifting the anus up and in.  Then release the muscles and your anus should drop down and feel open. Repeat 5 times ending with the feeling of relaxation. c) Keep your pelvic floor muscles relaxed; let your belly bulge out. d) The digestive tract starts at the mouth and ends at the anal opening, so be sure to relax both ends of the tube.  Place your tongue on the roof of your mouth with your teeth separated.  This helps relax your mouth and will help to relax the anus at the same time.  3) Empty (defecation) a) Keep your pelvic floor and sphincter relaxed, then bulge your anal muscles.  Make the anal opening wide.  b) Stick your belly out as if you have swallowed a beach ball. c) Make your belly wall hard using your belly muscles while  continuing to breathe. Doing this makes it easier to open your anus. d) Breath out and give a grunt (or try using other sounds such as ahhhh, shhhhh, ohhhh or grrrrrrr).  4) Finish a) As you finish your bowel movement, pull the pelvic floor muscles up and in.  This will leave your anus in the proper place rather than remaining pushed out and down. If you leave your anus pushed out and down, it will start to feel as though that is normal and give you incorrect signals about needing to have a bowel movement.    [facility] [address] [place], [place] [address] Phone # [phone] Fax [phone] [email or web address]

## 2016-04-19 NOTE — Therapy (Signed)
[facility] [address], [place], [place], [address] Phone: [phone]   Fax:  [phone]  Physical Therapy Evaluation  Patient Details  Name: Benjamin [name] MRN: [id number] Date of Birth: Nov 09, 1951 Referring Provider: Dr. Alexis Frock  Encounter Date: 04/19/2016      PT End of Session - 04/19/16 0858    Visit Number 1   Date for PT Re-Evaluation 10/19/16   PT Start Time 0800   PT Stop Time 0845   PT Time Calculation (min) 45 min   Activity Tolerance Patient tolerated treatment well   Behavior During Therapy [facility] for tasks assessed/performed      Past Medical History  Diagnosis Date  . Seizures ([place])   . Prostate cancer ([place]) 07/03/12    Adenocarcinoma,gleason=3+3=6,PSA=5.0,volume=20cc  . Allergy   . Anxiety   . Depression   . Hypercholesterolemia   . Acute prostatitis 07/03/12    s/p prostate   . Back pain   . BPH (benign prostatic hypertrophy) with urinary obstruction   . ED (erectile dysfunction)   . Arthritis     stenosis lower back  . History of ITP     as a child per patient  . Other forms of epilepsy and recurrent seizures without mention of intractable epilepsy 09/16/2013  . Headache(784.0)     "sinus" headaches  . Unspecified hereditary and idiopathic peripheral neuropathy 07/07/2014    Foot numbness starting 2010, hands beginning in 2014- 15 .  Marland Kitchen Peripheral axonal neuropathy 08/06/2014    Past Surgical History  Procedure Laterality Date  . Toe surgery    . Colonoscopy    . Lumbar laminectomy/decompression microdiscectomy N/A 12/23/2013    Procedure: Lumbar Laminectomy Decompression, Lumbar Two, Three, Four, Five;  Surgeon: Hosie Spangle, MD;  Location: [facility];  Service: Neurosurgery;  Laterality: N/A;    There were no vitals filed for this visit.       Subjective Assessment - 04/19/16 0808    Subjective Patient was diagnosed with prostate cancer 12/2015.  It has spread to 3  different places. Surgery is on 05/25/2016.  Patient is having knee surgery on 04/26/2016 to repair meniscus.    Patient Stated Goals education on exercies to strengthen pelvic floor   Currently in Pain? No/denies            [facility] PT Assessment - 04/19/16 0001    Assessment   Medical Diagnosis C61 Prostate cancer   Referring Provider Dr. Alexis Frock   Onset Date/Surgical Date 12/14/15   Prior Therapy None   Precautions   Precautions Other (comment)   Precaution Comments cancer precautions   Restrictions   Weight Bearing Restrictions No   Balance Screen   Has the patient fallen in the past 6 months No   Has the patient had a decrease in activity level because of a fear of falling?  No   Is the patient reluctant to leave their home because of a fear of falling?  No   Prior Function   Level of Independence Independent   Vocation Full time employment   Vocation Requirements mailman   Cognition   Overall Cognitive Status Within Functional Limits for tasks assessed   Observation/Other Assessments   Focus on Therapeutic Outcomes (FOTO)  2% limitation   AROM   Lumbar Flexion full   Lumbar Extension decreased by 90%   Lumbar - Right Side Bend decreased by 50%   Lumbar - Left Side Bend decreased by 50%  Strength   Overall Strength Comments hip abduction and extension 4/5                 Pelvic Floor Special Questions - 04/19/16 0001    Urinary Leakage No   Fecal incontinence No   Exam Type Deferred  pelvic EMG and assessing muscle strength                  PT Education - 04/19/16 0857    Education provided Yes   Education Details bladder irritants, pelvic floor exericses, different ways to control urinary incontinence, toileting techniques   Person(s) Educated Patient   Methods Explanation;Demonstration;Verbal cues;Handout   Comprehension Returned demonstration;Verbalized understanding          PT Short Term Goals - 04/19/16 1019    PT SHORT  TERM GOAL #1   Title independent with initial HEP for pelvic floor contraction   Time 4   Period Weeks   Status New   PT SHORT TERM GOAL #2   Title understand precautions for surgery   Time 4   Period Weeks   Status New   PT SHORT TERM GOAL #3   Title understand what bladder irritants are that will affect the bladder after surgery   Time 4   Period Weeks   Status New   PT SHORT TERM GOAL #4   Title independent with flxibility exercise   Time 4   Period Weeks   Status New           PT Long Term Goals - 04/19/16 1020    PT LONG TERM GOAL #1   Title independent with HEP   Time 6   Period Months   Status New   PT LONG TERM GOAL #2   Title urinary leakage from surgery decreased >/=75%   Time 6   Period Months   Status New   PT LONG TERM GOAL #3   Title understand scar massage to keep mobility of tissue   Time 6   Period Months   Status Achieved   PT LONG TERM GOAL #4   Title pelvic floor strength is 4/5 to reduce urinary leakage   Time 6   Period Months               Plan - 04/19/16 0901    Clinical Impression Statement Patient was diagnosed of Prostate Cancer on 12/2015.  Patient is going to have robotic laproscopic surgery on 05/25/2016.  Patient reports no  pelvic pain and no urinary incontinence.  Bilateral hip strength for abduction and extension is 4/5. Patient reports he is able to feel the pelvic floor contraction so he did not want to do the pelvic floor EMG today.  Patient has not been educated on precautions of prostate cancer surgery and how to strengthen the pelvic floor for surgery.  Patient is going to have arthroscopic sugery on 04/26/2016 to repair the meniscus.  Patient lumbar extension is decreased by 90% and bilateral sidebending decreased by 50%.  Patient has history of back problems.  Patient is of low complexity.  patient will benefit from physical therapy to educate prior to sugery and then strengthen after surgery due to urniary incontinence  is a product of surgery.    Rehab Potential Excellent   Clinical Impairments Affecting Rehab Potential prostate cancer surgery on 05/26/2016   PT Frequency 1x / week   PT Duration Other (comment)  4 months   PT Treatment/Interventions Passive range of motion;Energy conservation;Scar mobilization;Manual  techniques;Neuromuscular re-education;Patient/family education;Therapeutic exercise;Therapeutic activities;Functional mobility training;Electrical Stimulation;Biofeedback   PT Next Visit Plan precautions for after surgery; transverse abdominus contraction, inner thigh, stretch, quad stretch, hamstring stretch, piriformis stretch, scar massage   PT Home Exercise Plan see plan   Recommended Other Services none   Consulted and Agree with Plan of Care Patient      Patient will benefit from skilled therapeutic intervention in order to improve the following deficits and impairments:  Other (comment), Decreased strength, Decreased knowledge of precautions (education prior to surgery)  Visit Diagnosis: Muscle weakness (generalized) - Plan: PT plan of care cert/re-cert  Aftercare following surgery for neoplasm - Plan: PT plan of care cert/re-cert     Problem List Patient Active Problem List   Diagnosis Date Noted  . Peripheral axonal neuropathy 08/06/2014  . Unspecified hereditary and idiopathic peripheral neuropathy 07/07/2014  . Lumbar stenosis with neurogenic claudication 12/23/2013  . Other forms of epilepsy and recurrent seizures without mention of intractable epilepsy 09/16/2013  . Prostate cancer [facility]) 07/03/2012    Earlie Counts, PT 04/19/2016 10:25 AM   [facility] [address], [place], [place], [address] Phone: [phone]   Fax:  [phone]  Name: Benjamin [name] MRN: [id number] Date of Birth: [birth year]

## 2016-04-20 ENCOUNTER — Telehealth: Payer: Self-pay | Admitting: *Deleted

## 2016-04-20 NOTE — Telephone Encounter (Signed)
Records faxed to [facility] on 04/20/16.

## 2016-04-30 ENCOUNTER — Ambulatory Visit: Payer: 59 | Admitting: Physical Therapy

## 2016-05-02 ENCOUNTER — Ambulatory Visit: Payer: 59 | Admitting: Physical Therapy

## 2016-05-02 ENCOUNTER — Encounter: Payer: Self-pay | Admitting: Physical Therapy

## 2016-05-02 DIAGNOSIS — Z483 Aftercare following surgery for neoplasm: Secondary | ICD-10-CM

## 2016-05-02 DIAGNOSIS — M6281 Muscle weakness (generalized): Secondary | ICD-10-CM

## 2016-05-02 NOTE — Patient Instructions (Signed)
Prior to Prostate surgery the right leg is to be elevated above heart with ice 15 min. No laying with legs below heart with pillow underneath the right knee.   Can come to PT for pelvic floor 4 weeks after prostate surgery or when doctor lets you know  No exercise with catheter in.  No abdominal crunches for 6 weeks after surgery  After surgery lay on side with pillows between knees or lay on back with pillows under knees     Sleeping on Side    Place pillow between knees. Use cervical support under neck and a roll around waist as needed.   Copyright  VHI. All rights reserved.  Sleeping on Back    Place pillow under knees. A pillow with cervical support and a roll around waist are also helpful.   Copyright  VHI. All rights reserved.  After Prostate surgery can use ice on  On abdominal area and below scrotum 10-15 min 2 times per day.  Precautions for after prostate surgery:   1. Blood in urine 2.  Increased pain in right leg at night Call doctor  Piriformis Stretch, Sitting    Sit, one ankle on opposite knee, same-side hand on crossed knee. Push down on knee, keeping spine straight. Lean torso forward, with flat back, until tension is felt in hamstrings and gluteals of crossed-leg side. Hold 30___ seconds.  Repeat _2__ times per session. Do _1__ sessions per day. Can do laying on back Copyright  VHI. All rights reserved.  Chair Sitting    Sit at edge of seat, spine straight, one leg extended. Put a hand on each thigh and bend forward from the hip, keeping spine straight. Allow hand on extended leg to reach toward toes. Support upper body with other arm. Hold _30__ seconds. Repeat _2__ times per session. Do _1__ sessions per day.  Copyright  VHI. All rights reserved.  Butterfly, Supine    Lie on back, feet together. Lower knees toward floor. Hold ___ seconds. Repeat ___ times per session. Do ___ sessions per day. While in this position can massage just the  scrotum for several minutes to relax the pelvic floor.  Copyright  VHI. All rights reserved.    Plantar Fascia, Standing    Stand on stairs or curb. Lower heel. Hold _30__ seconds.  Repeat _2__ times per session. Do _1__ sessions per day.  Copyright  VHI. All rights reserved.  ROM: Plantar / Dorsiflexion    With left leg relaxed, gently flex and extend ankle. Move through full range of motion. Avoid pain. Repeat _10___ times per set. Do __1__ sets per session. Do __4__ sessions per day. So after prostate surgery to prevent blood clots.  http://orth.exer.us/34   Copyright  VHI. All rights reserved.  Isometric Hold With Pelvic Floor (Hook-Lying)    Lie with hips and knees bent. Slowly inhale, and then exhale. Pull navel toward spine and tighten pelvic floor. Hold for _10__ seconds. Continue to breathe in and out during hold. Rest for _5__ seconds. Repeat _10__ times. Do _3__ times a day.   Copyright  VHI. All rights reserved.  Getting Into / Out of Bed    Lower self to lie down on one side by raising legs and lowering head at the same time. Use arms to assist moving without twisting. Bend both knees to roll onto back if desired. To sit up, start from lying on side, and use same move-ments in reverse. Keep trunk aligned with legs.   Copyright  VHI. All rights reserved.  Log Roll    Lying on back, bend left knee and place left arm across chest. Roll all in one movement to the right. Reverse to roll to the left. Always move as one unit.   Copyright  VHI. All rights reserved.  Getting Into / Out of Car    Lower self onto seat, scoot back, then bring in one leg at a time. Reverse sequence to get out.   Copyright  VHI. All rights reserved.   [place] [address] [place], [place] [address] Phone # [phone] Fax [phone]

## 2016-05-02 NOTE — Therapy (Signed)
[facility] [address], [place], [place], [address] Phone: [phone]   Fax:  [phone]  Physical Therapy Treatment  Patient Details  Name: Benjamin [name] MRN: [id number] Date of Birth: 05-15-1951 Referring Provider: Dr. Alexis Frock  Encounter Date: 05/02/2016      PT End of Session - 05/02/16 1003    Visit Number 2   Date for PT Re-Evaluation 10/19/16   PT Start Time 0930   PT Stop Time 1008   PT Time Calculation (min) 38 min   Activity Tolerance Patient tolerated treatment well   Behavior During Therapy [facility] for tasks assessed/performed      Past Medical History  Diagnosis Date  . Seizures ([place])   . Prostate cancer ([place]) 07/03/12    Adenocarcinoma,gleason=3+3=6,PSA=5.0,volume=20cc  . Allergy   . Anxiety   . Depression   . Hypercholesterolemia   . Acute prostatitis 07/03/12    s/p prostate   . Back pain   . BPH (benign prostatic hypertrophy) with urinary obstruction   . ED (erectile dysfunction)   . Arthritis     stenosis lower back  . History of ITP     as a child per patient  . Other forms of epilepsy and recurrent seizures without mention of intractable epilepsy 09/16/2013  . Headache(784.0)     "sinus" headaches  . Unspecified hereditary and idiopathic peripheral neuropathy 07/07/2014    Foot numbness starting 2010, hands beginning in 2014- 15 .  Marland Kitchen Peripheral axonal neuropathy 08/06/2014    Past Surgical History  Procedure Laterality Date  . Toe surgery    . Colonoscopy    . Lumbar laminectomy/decompression microdiscectomy N/A 12/23/2013    Procedure: Lumbar Laminectomy Decompression, Lumbar Two, Three, Four, Five;  Surgeon: Hosie Spangle, MD;  Location: [facility];  Service: Neurosurgery;  Laterality: N/A;    There were no vitals filed for this visit.      Subjective Assessment - 05/02/16 0936    Subjective I had knee surgery on 04/26/2016 and on a walker.  Patient will have PT   for one visit to learn exercises.    Patient Stated Goals education on exercies to strengthen pelvic floor   Currently in Pain? Yes   Pain Score 8    Pain Location Knee   Pain Orientation Right   Pain Descriptors / Indicators Aching   Pain Type Surgical pain   Pain Onset In the past 7 days   Pain Frequency Intermittent   Aggravating Factors  movement, walking   Pain Relieving Factors rest   Multiple Pain Sites No                                 PT Education - 05/02/16 1002    Education provided Yes   Education Details abdominal bracing, precautions for after surgery, when to call doctor after surgery, stretches, body mechanics for getting in and out of bed and car, pelvic floor massage, bed positioning   Person(s) Educated Patient   Methods Explanation;Demonstration;Verbal cues;Handout   Comprehension Returned demonstration;Verbalized understanding          PT Short Term Goals - 05/02/16 1010    PT SHORT TERM GOAL #1   Title independent with initial HEP for pelvic floor contraction   Time 4   Period Weeks   Status Achieved   PT SHORT TERM GOAL #2   Title understand precautions for surgery  Time 4   Period Weeks   Status Achieved   PT SHORT TERM GOAL #3   Title understand what bladder irritants are that will affect the bladder after surgery   Time 4   Period Weeks   Status Achieved   PT SHORT TERM GOAL #4   Title independent with flxibility exercise   Time 4   Period Weeks   Status Achieved           PT Long Term Goals - 04/19/16 1020    PT LONG TERM GOAL #1   Title independent with HEP   Time 6   Period Months   Status New   PT LONG TERM GOAL #2   Title urinary leakage from surgery decreased >/=75%   Time 6   Period Months   Status New   PT LONG TERM GOAL #3   Title understand scar massage to keep mobility of tissue   Time 6   Period Months   Status Achieved   PT LONG TERM GOAL #4   Title pelvic floor strength is 4/5 to  reduce urinary leakage   Time 6   Period Months               Plan - 05/02/16 1003    Clinical Impression Statement Patient had right knee arthroscopic knee surgery last week.  He will have prostate surgery on 05/25/2016.  Patient has learned the precautions for surgery, bed positioning, stretches, when to start exercises after surgery, body mechanics with getting in and out of bed and car, pelvic floor exericses.  all information was typed out on handout. Patient will return to therapy 4 weeks after surgery to be reassesed and resume pelvic floor exercises.    Rehab Potential Excellent   Clinical Impairments Affecting Rehab Potential prostate cancer surgery on 05/26/2016   PT Frequency 1x / week   PT Duration Other (comment)  4 months   PT Treatment/Interventions Passive range of motion;Energy conservation;Scar mobilization;Manual techniques;Neuromuscular re-education;Patient/family education;Therapeutic exercise;Therapeutic activities;Functional mobility training;Electrical Stimulation;Biofeedback   PT Next Visit Plan see patient four weeks after surgery, 05/25/2016.  Reassess and make new goals   PT Home Exercise Plan progress with new exercises   Recommended Other Services None   Consulted and Agree with Plan of Care Patient      Patient will benefit from skilled therapeutic intervention in order to improve the following deficits and impairments:  Other (comment), Decreased strength, Decreased knowledge of precautions (education prior to surgery. )  Visit Diagnosis: Muscle weakness (generalized)  Aftercare following surgery for neoplasm     Problem List Patient Active Problem List   Diagnosis Date Noted  . Peripheral axonal neuropathy 08/06/2014  . Unspecified hereditary and idiopathic peripheral neuropathy 07/07/2014  . Lumbar stenosis with neurogenic claudication 12/23/2013  . Other forms of epilepsy and recurrent seizures without mention of intractable epilepsy  09/16/2013  . Prostate cancer [facility]) 07/03/2012    Earlie Counts, PT 05/02/2016 10:11 AM   [facility] [address], [place], [place], [address] Phone: [phone]   Fax:  [phone]  Name: Benjamin [name] MRN: [id number] Date of Birth: [birth year]

## 2016-05-03 ENCOUNTER — Ambulatory Visit: Payer: 59 | Admitting: Physical Therapy

## 2016-05-03 DIAGNOSIS — M6281 Muscle weakness (generalized): Secondary | ICD-10-CM

## 2016-05-03 DIAGNOSIS — R600 Localized edema: Secondary | ICD-10-CM

## 2016-05-03 DIAGNOSIS — M25561 Pain in right knee: Secondary | ICD-10-CM

## 2016-05-03 NOTE — Patient Instructions (Signed)
   Copyright  VHI. All rights reserved.  HIP: Flexion / KNEE: Extension, Straight Leg Raise   Raise leg, keeping knee straight. Perform slowly. _10-20__ reps per set, __1-2_ sets per day, _5-7__ days per week  Then do a set with your toes OUT to the side.  See above for reps and sets.      http://gt2.exer.us/372   Copyright  VHI. All rights reserved.     Raise leg until knee is straight. __20_ reps per set, _1-2__ sets per day, _5-7__ days per week  Copyright  VHI. All rights reserved.    http://gt2.exer.us/365   Copyright  VHI. All rights reserved.  Quad Set   Slowly tighten muscles on thigh of straight leg while counting to 5. Repeat with other leg. Repeat ___10-20_ times. Do ___3-5_ sessions per day.  http://[email or web address].exer.us/361   Copyright  VHI. All rights reserved.

## 2016-05-03 NOTE — Therapy (Signed)
[place], [place], [address] Phone: [phone]   Fax:  [phone]  Physical Therapy Evaluation/Discharge   Patient Details  Name: Benjamin [name] MRN: [id number] Date of Birth: 04/30/1951 Referring Provider: Dr. Joni Fears  Encounter Date: 05/03/2016      PT End of Session - 05/03/16 1204    Visit Number 1   Number of Visits 1   PT Start Time [id number]   PT Stop Time 1146   PT Time Calculation (min) 43 min   Activity Tolerance Patient tolerated treatment well   Behavior During Therapy [facility] for tasks assessed/performed      Past Medical History  Diagnosis Date  . Seizures ([place])   . Prostate cancer ([place]) 07/03/12    Adenocarcinoma,gleason=3+3=6,PSA=5.0,volume=20cc  . Allergy   . Anxiety   . Depression   . Hypercholesterolemia   . Acute prostatitis 07/03/12    s/p prostate   . Back pain   . BPH (benign prostatic hypertrophy) with urinary obstruction   . ED (erectile dysfunction)   . Arthritis     stenosis lower back  . History of ITP     as a child per patient  . Other forms of epilepsy and recurrent seizures without mention of intractable epilepsy 09/16/2013  . Headache(784.0)     "sinus" headaches  . Unspecified hereditary and idiopathic peripheral neuropathy 07/07/2014    Foot numbness starting 2010, hands beginning in 2014- 15 .  Marland Kitchen Peripheral axonal neuropathy 08/06/2014    Past Surgical History  Procedure Laterality Date  . Toe surgery    . Colonoscopy    . Lumbar laminectomy/decompression microdiscectomy N/A 12/23/2013    Procedure: Lumbar Laminectomy Decompression, Lumbar Two, Three, Four, Five;  Surgeon: Hosie Spangle, MD;  Location: [facility];  Service: Neurosurgery;  Laterality: N/A;    There were no vitals filed for this visit.       Subjective Assessment - 05/03/16 1110    Subjective Pt here for 1 time visit for Rt. knee s/p meniscectomy on 04/25/16.  He can recall an  incidence of knee pain at work, but details are vague. Pt doing well overall but has some mild pain with walking, bending and extending knee for daily activities.    Pertinent History Prostate CA, surgery scheduled for 05/25/16.     Limitations Lifting;Standing;Walking   How long can you sit comfortably? As long as needed, but reported stiffness when standing up   How long can you stand comfortably? 30 min   How long can you walk comfortably? 30 min    Diagnostic tests MRI prior to surgery.    Patient Stated Goals to get a list of exercises I can do at home    Currently in Pain? No/denies   Pain Score 5   with overexertion   Pain Location Knee   Pain Orientation Right;Anterior;Medial   Pain Descriptors / Indicators Aching;Sore   Pain Type Acute pain   Pain Onset In the past 7 days   Pain Frequency Intermittent   Aggravating Factors  moving and walking, standing    Pain Relieving Factors sitting, rest   Effect of Pain on Daily Activities unable to work   Multiple Pain Sites No            [facility] PT Assessment - 05/03/16 1200    Assessment   Medical Diagnosis R knee pain   Referring Provider Dr. Joni Fears   Onset Date/Surgical Date 04/26/16   Next  MD Visit unknown   Prior Therapy Not for knee    Precautions   Precautions Other (comment)   Precaution Comments cancer precautions   Restrictions   Weight Bearing Restrictions No   Prior Function   Level of Independence Independent   Vocation Full time employment   Vocation Requirements mailman   Cognition   Overall Cognitive Status Within Functional Limits for tasks assessed   Observation/Other Assessments   Focus on Therapeutic Outcomes (FOTO)  NT   Sensation   Light Touch Appears Intact   Coordination   Gross Motor Movements are Fluid and Coordinated Not tested   AROM   Right Knee Extension -10   Right Knee Flexion 108  Pt reported soreness with flexion   Left Knee Extension 135   Left Knee Flexion 0   Strength    Overall Strength Comments hip abduction and extension 4/5   Palpation   Patella mobility hypomobile , swelling peripatellar, tender with palpation    Ambulation/Gait   Ambulation Distance (Feet) 150 Feet   Assistive device Rolling walker   Gait Pattern Step-through pattern;Decreased hip/knee flexion - right;Right flexed knee in stance;Antalgic;Trunk flexed   Ambulation Surface Level;Indoor              PT Education - 05/03/16 1123    Education provided Yes   Education Details HEP for knee, technique and ice for swelling    Person(s) Educated Patient   Methods Explanation;Demonstration;Handout   Comprehension Verbalized understanding;Returned demonstration          PT Short Term Goals - 05/03/16 1207    PT SHORT TERM GOAL #1   Title Pt will get full level 1-2 knee HEP for AROM, strength.    Time 1   Period Days   Status Achieved           PT Long Term Goals - 04/19/16 1020    PT LONG TERM GOAL #1   Title independent with HEP   Time 6   Period Months   Status New   PT LONG TERM GOAL #2   Title urinary leakage from surgery decreased >/=75%   Time 6   Period Months   Status New   PT LONG TERM GOAL #3   Title understand scar massage to keep mobility of tissue   Time 6   Period Months   Status Achieved   PT LONG TERM GOAL #4   Title pelvic floor strength is 4/5 to reduce urinary leakage   Time 6   Period Months               Plan - 05/03/16 1204    Clinical Impression Statement Patient given full HEP during this low complexity evaluation for Rt knee meniscectomy.  Patient encouraged to keep compliant with HEP, call with questions or concerns.  Will follow up with Pelvic Floor PT after surgery to resume Pelvic floor therapy.    Rehab Potential Excellent   PT Frequency One time visit   PT Treatment/Interventions Patient/family education;Therapeutic exercise;Gait training   PT Next Visit Plan see patient four weeks after surgery, 05/25/2016.   Reassess and make new goals, NO more knee PT.    Consulted and Agree with Plan of Care Patient      Patient will benefit from skilled therapeutic intervention in order to improve the following deficits and impairments:  Impaired flexibility, Increased edema, Decreased strength, Decreased mobility, Pain, Difficulty walking, Decreased range of motion  Visit Diagnosis: Muscle weakness (generalized)  Pain  in right knee  Localized edema     Problem List Patient Active Problem List   Diagnosis Date Noted  . Peripheral axonal neuropathy 08/06/2014  . Unspecified hereditary and idiopathic peripheral neuropathy 07/07/2014  . Lumbar stenosis with neurogenic claudication 12/23/2013  . Other forms of epilepsy and recurrent seizures without mention of intractable epilepsy 09/16/2013  . Prostate cancer ([place]) 07/03/2012    Benjamin [name] 05/03/2016, 12:12 PM  [facility] [address] [place], [place], [address] Phone: [phone]   Fax:  [phone]  Name: Benjamin [name] MRN: [id number] Date of Birth: [birth year]   Raeford Razor, PT 05/03/2016 12:12 PM Phone: [phone] Fax: [phone]

## 2016-05-07 ENCOUNTER — Encounter: Payer: 59 | Admitting: Physical Therapy

## 2016-05-08 ENCOUNTER — Ambulatory Visit: Payer: 59 | Admitting: Physical Therapy

## 2016-05-17 ENCOUNTER — Ambulatory Visit: Payer: 59

## 2016-05-18 NOTE — Patient Instructions (Addendum)
Benjamin [name]  05/18/2016   Your procedure is scheduled on: 05-25-16  Report to [facility] Main  Entrance take [facility]  elevators to 3rd floor to  [place] at 1045  AM.  Call this number if you have problems the morning of surgery [phone]   Remember: ONLY 1 PERSON MAY GO WITH YOU TO SHORT STAY TO GET  READY MORNING OF Benjamin [name].  Do not eat food :After Midnight Wednesday night, clear liquids all day Thursday 05-24-16 per dr Tresa Moore instructions, no clear liquids after midnight thursday night, follow all bowel prep instructions from dr Tresa Moore. .     Take these medicines the morning of surgery with A SIP OF WATER: KEPPRA                               You may not have any metal on your body including hair pins and              piercings  Do not wear jewelry, make-up, lotions, powders or perfumes, deodorant             Do not wear nail polish.  Do not shave  48 hours prior to surgery.              Men may shave face and neck.   Do not bring valuables to the hospital. [place].  Contacts, dentures or bridgework may not be worn into surgery.  Leave suitcase in the car. After surgery it may be brought to your room.     Patients discharged the day of surgery will not be allowed to drive home.  Name and phone number of your driver:  Special Instructions: N/A              Please read over the following fact sheets you were given: _____________________________________________________________________                CLEAR LIQUID DIET   Foods Allowed                                                                     Foods Excluded  Coffee and tea, regular and decaf                             liquids that you cannot  Plain Jell-O in any flavor                                             see through such as: Fruit ices (not with fruit pulp)                                     milk, soups, orange juice   Iced Popsicles  All solid food Carbonated beverages, regular and diet                                    Cranberry, grape and apple juices Sports drinks like Gatorade Lightly seasoned clear broth or consume(fat free) Sugar, honey syrup  Sample Menu Breakfast                                Lunch                                     Supper Cranberry juice                    Beef broth                            Chicken broth Jell-O                                     Grape juice                           Apple juice Coffee or tea                        Jell-O                                      Popsicle                                                Coffee or tea                        Coffee or tea  _____________________________________________________________________  [facility] - Preparing for Surgery Before surgery, you can play an important role.  Because skin is not sterile, your skin needs to be as free of germs as possible.  You can reduce the number of germs on your skin by washing with CHG (chlorahexidine gluconate) soap before surgery.  CHG is an antiseptic cleaner which kills germs and bonds with the skin to continue killing germs even after washing. Please DO NOT use if you have an allergy to CHG or antibacterial soaps.  If your skin becomes reddened/irritated stop using the CHG and inform your nurse when you arrive at Short Stay. Do not shave (including legs and underarms) for at least 48 hours prior to the first CHG shower.  You may shave your face/neck. Please follow these instructions carefully:  1.  Shower with CHG Soap the night before surgery and the  morning of Surgery.  2.  If you choose to wash your hair, wash your hair first as usual with your  normal  shampoo.  3.  After you shampoo, rinse your hair and body thoroughly to remove the  shampoo.  4.  Use CHG as you would any other liquid soap.  You can apply chg  directly  to the skin and wash                       Gently with a scrungie or clean washcloth.  5.  Apply the CHG Soap to your body ONLY FROM THE NECK DOWN.   Do not use on face/ open                           Wound or open sores. Avoid contact with eyes, ears mouth and genitals (private parts).                       Wash face,  Genitals (private parts) with your normal soap.             6.  Wash thoroughly, paying special attention to the area where your surgery  will be performed.  7.  Thoroughly rinse your body with warm water from the neck down.  8.  DO NOT shower/wash with your normal soap after using and rinsing off  the CHG Soap.                9.  Pat yourself dry with a clean towel.            10.  Wear clean pajamas.            11.  Place clean sheets on your bed the night of your first shower and do not  sleep with pets. Day of Surgery : Do not apply any lotions/deodorants the morning of surgery.  Please wear clean clothes to the hospital/surgery center.  FAILURE TO FOLLOW THESE INSTRUCTIONS MAY RESULT IN THE CANCELLATION OF YOUR SURGERY PATIENT SIGNATURE_________________________________  NURSE SIGNATURE__________________________________  ________________________________________________________________________

## 2016-05-21 ENCOUNTER — Encounter (HOSPITAL_COMMUNITY): Admission: RE | Admit: 2016-05-21 | Payer: 59 | Source: Ambulatory Visit

## 2016-05-23 ENCOUNTER — Encounter (HOSPITAL_COMMUNITY): Payer: Self-pay

## 2016-05-23 ENCOUNTER — Encounter (HOSPITAL_COMMUNITY)
Admission: RE | Admit: 2016-05-23 | Discharge: 2016-05-23 | Disposition: A | Discharge status: Home / Self Care | Payer: 59 | Source: Ambulatory Visit | Attending: Urology | Admitting: Urology

## 2016-05-23 LAB — BASIC METABOLIC PANEL
ANION GAP: 7 (ref 5–15)
BUN: 18 mg/dL (ref 6–20)
CHLORIDE: 105 mmol/L (ref 101–111)
CO2: 24 mmol/L (ref 22–32)
CREATININE: 0.95 mg/dL (ref 0.61–1.24)
Calcium: 8.5 mg/dL — ABNORMAL LOW (ref 8.9–10.3)
GFR calc non Af Amer: >60 mL/min (ref >60)
Glucose, Bld: 140 mg/dL — ABNORMAL HIGH (ref 65–99)
POTASSIUM: 4.2 mmol/L (ref 3.5–5.1)
SODIUM: 136 mmol/L (ref 135–145)

## 2016-05-23 LAB — CBC
HEMATOCRIT: 42.4 % (ref 39.0–52.0)
HEMOGLOBIN: 14.5 g/dL (ref 13.0–17.0)
MCH: 30.3 pg (ref 26.0–34.0)
MCHC: 34.2 g/dL (ref 30.0–36.0)
MCV: 88.5 fL (ref 78.0–100.0)
Platelets: 185 10*3/uL (ref 150–400)
RBC: 4.79 MIL/uL (ref 4.22–5.81)
RDW: 12.8 % (ref 11.5–15.5)
WBC: 6.2 10*3/uL (ref 4.0–10.5)

## 2016-05-23 LAB — ABO/RH: ABO/RH(D): A POS

## 2016-05-25 ENCOUNTER — Inpatient Hospital Stay (HOSPITAL_COMMUNITY): Payer: 59 | Admitting: Certified Registered Nurse Anesthetist

## 2016-05-25 ENCOUNTER — Encounter (HOSPITAL_COMMUNITY): Payer: Self-pay | Admitting: Certified Registered Nurse Anesthetist

## 2016-05-25 ENCOUNTER — Encounter (HOSPITAL_COMMUNITY)
Admission: RE | Disposition: A | Discharge status: Home / Self Care | Payer: Self-pay | Source: Ambulatory Visit | Attending: Urology

## 2016-05-25 ENCOUNTER — Inpatient Hospital Stay (HOSPITAL_COMMUNITY)
Admission: RE | Admit: 2016-05-25 | Discharge: 2016-05-26 | DRG: 707 | Disposition: A | Discharge status: Home / Self Care | Payer: 59 | Source: Ambulatory Visit | Attending: Urology | Admitting: Urology

## 2016-05-25 DIAGNOSIS — Z888 Allergy status to other drugs, medicaments and biological substances status: Secondary | ICD-10-CM

## 2016-05-25 DIAGNOSIS — X58XXXA Exposure to other specified factors, initial encounter: Secondary | ICD-10-CM | POA: Diagnosis present

## 2016-05-25 DIAGNOSIS — E669 Obesity, unspecified: Secondary | ICD-10-CM | POA: Diagnosis present

## 2016-05-25 DIAGNOSIS — G40802 Other epilepsy, not intractable, without status epilepticus: Secondary | ICD-10-CM | POA: Diagnosis present

## 2016-05-25 DIAGNOSIS — S76101A Unspecified injury of right quadriceps muscle, fascia and tendon, initial encounter: Secondary | ICD-10-CM | POA: Diagnosis present

## 2016-05-25 DIAGNOSIS — C61 Malignant neoplasm of prostate: Principal | ICD-10-CM | POA: Diagnosis present

## 2016-05-25 DIAGNOSIS — M4806 Spinal stenosis, lumbar region: Secondary | ICD-10-CM | POA: Diagnosis present

## 2016-05-25 DIAGNOSIS — Y929 Unspecified place or not applicable: Secondary | ICD-10-CM | POA: Diagnosis not present

## 2016-05-25 DIAGNOSIS — Z6830 Body mass index (BMI) 30.0-30.9, adult: Secondary | ICD-10-CM | POA: Diagnosis not present

## 2016-05-25 DIAGNOSIS — Z8042 Family history of malignant neoplasm of prostate: Secondary | ICD-10-CM | POA: Diagnosis not present

## 2016-05-25 DIAGNOSIS — G629 Polyneuropathy, unspecified: Secondary | ICD-10-CM | POA: Diagnosis present

## 2016-05-25 HISTORY — PX: ROBOT ASSISTED LAPAROSCOPIC RADICAL PROSTATECTOMY: SHX5141

## 2016-05-25 HISTORY — PX: LYMPHADENECTOMY: SHX5960

## 2016-05-25 LAB — TYPE AND SCREEN
ABO/RH(D): A POS
ANTIBODY SCREEN: NEGATIVE

## 2016-05-25 LAB — HEMOGLOBIN AND HEMATOCRIT, BLOOD
HEMATOCRIT: 41 % (ref 39.0–52.0)
Hemoglobin: 13.9 g/dL (ref 13.0–17.0)

## 2016-05-25 SURGERY — PROSTATECTOMY, RADICAL, ROBOT-ASSISTED, LAPAROSCOPIC
Anesthesia: General

## 2016-05-25 MED ORDER — SODIUM CHLORIDE 0.9 % IV BOLUS (SEPSIS)
1000.0000 mL | Freq: Once | INTRAVENOUS | Status: AC
  Administered 2016-05-25: 1000 mL via INTRAVENOUS

## 2016-05-25 MED ORDER — DEXTROSE-NACL 5-0.45 % IV SOLN
INTRAVENOUS | Status: DC
  Administered 2016-05-25 – 2016-05-26 (×2): via INTRAVENOUS

## 2016-05-25 MED ORDER — LACTATED RINGERS IV SOLN
INTRAVENOUS | Status: DC
  Administered 2016-05-25: 1000 mL via INTRAVENOUS

## 2016-05-25 MED ORDER — ONDANSETRON HCL 4 MG/2ML IJ SOLN: 4.0000 mg | INTRAMUSCULAR | Status: DC | PRN

## 2016-05-25 MED ORDER — LIDOCAINE HCL (CARDIAC) 20 MG/ML IV SOLN
INTRAVENOUS | Status: DC | PRN
  Administered 2016-05-25: 75 mg via INTRAVENOUS

## 2016-05-25 MED ORDER — PHENYLEPHRINE HCL 10 MG/ML IJ SOLN
INTRAMUSCULAR | Status: DC | PRN
  Administered 2016-05-25: 40 ug via INTRAVENOUS

## 2016-05-25 MED ORDER — LABETALOL HCL 5 MG/ML IV SOLN
INTRAVENOUS | Status: DC | PRN
  Administered 2016-05-25: 5 mg via INTRAVENOUS

## 2016-05-25 MED ORDER — KETOROLAC TROMETHAMINE 30 MG/ML IJ SOLN
INTRAMUSCULAR | Status: AC
  Filled 2016-05-25: qty 1

## 2016-05-25 MED ORDER — FENTANYL CITRATE (PF) 100 MCG/2ML IJ SOLN
INTRAMUSCULAR | Status: DC | PRN
  Administered 2016-05-25 (×5): 50 ug via INTRAVENOUS

## 2016-05-25 MED ORDER — PROPOFOL 10 MG/ML IV BOLUS
INTRAVENOUS | Status: DC | PRN
  Administered 2016-05-25: 170 mg via INTRAVENOUS

## 2016-05-25 MED ORDER — MIDAZOLAM HCL 2 MG/2ML IJ SOLN
INTRAMUSCULAR | Status: AC
  Filled 2016-05-25: qty 2

## 2016-05-25 MED ORDER — CEFAZOLIN SODIUM-DEXTROSE 2-4 GM/100ML-% IV SOLN
INTRAVENOUS | Status: AC
  Filled 2016-05-25: qty 100

## 2016-05-25 MED ORDER — HYDROMORPHONE HCL 1 MG/ML IJ SOLN
INTRAMUSCULAR | Status: AC
  Administered 2016-05-25: 1 mg
  Filled 2016-05-25: qty 1

## 2016-05-25 MED ORDER — LACTATED RINGERS IV SOLN
INTRAVENOUS | Status: DC | PRN
  Administered 2016-05-25 (×3): via INTRAVENOUS

## 2016-05-25 MED ORDER — BUPIVACAINE LIPOSOME 1.3 % IJ SUSP
20.0000 mL | Freq: Once | INTRAMUSCULAR | Status: AC
  Administered 2016-05-25: 20 mL
  Filled 2016-05-25: qty 20

## 2016-05-25 MED ORDER — ROCURONIUM BROMIDE 100 MG/10ML IV SOLN
INTRAVENOUS | Status: AC
  Filled 2016-05-25: qty 1

## 2016-05-25 MED ORDER — HYDROCODONE-ACETAMINOPHEN 5-325 MG PO TABS: 1.0000 | ORAL_TABLET | Freq: Four times a day (QID) | ORAL | Status: DC | PRN

## 2016-05-25 MED ORDER — SODIUM CHLORIDE 0.9 % IJ SOLN
INTRAMUSCULAR | Status: DC | PRN
  Administered 2016-05-25: 20 mL

## 2016-05-25 MED ORDER — SUGAMMADEX SODIUM 500 MG/5ML IV SOLN
INTRAVENOUS | Status: DC | PRN
  Administered 2016-05-25: 400 mg via INTRAVENOUS

## 2016-05-25 MED ORDER — ONDANSETRON HCL 4 MG/2ML IJ SOLN
INTRAMUSCULAR | Status: DC | PRN
  Administered 2016-05-25: 4 mg via INTRAVENOUS

## 2016-05-25 MED ORDER — ONDANSETRON HCL 4 MG/2ML IJ SOLN
INTRAMUSCULAR | Status: AC
  Filled 2016-05-25: qty 2

## 2016-05-25 MED ORDER — DEXAMETHASONE SODIUM PHOSPHATE 10 MG/ML IJ SOLN
INTRAMUSCULAR | Status: DC | PRN
  Administered 2016-05-25: 10 mg via INTRAVENOUS

## 2016-05-25 MED ORDER — LABETALOL HCL 5 MG/ML IV SOLN
INTRAVENOUS | Status: AC
  Filled 2016-05-25: qty 4

## 2016-05-25 MED ORDER — HYDROMORPHONE HCL 1 MG/ML IJ SOLN
0.2500 mg | INTRAMUSCULAR | Status: DC | PRN
  Administered 2016-05-25 (×2): 0.5 mg via INTRAVENOUS

## 2016-05-25 MED ORDER — SODIUM CHLORIDE 0.9 % IJ SOLN
INTRAMUSCULAR | Status: AC
  Filled 2016-05-25: qty 50

## 2016-05-25 MED ORDER — OXYCODONE HCL 5 MG PO TABS: 5.0000 mg | ORAL_TABLET | ORAL | Status: DC | PRN

## 2016-05-25 MED ORDER — LIDOCAINE HCL (CARDIAC) 20 MG/ML IV SOLN
INTRAVENOUS | Status: AC
  Filled 2016-05-25: qty 5

## 2016-05-25 MED ORDER — KETOROLAC TROMETHAMINE 30 MG/ML IJ SOLN
30.0000 mg | Freq: Once | INTRAMUSCULAR | Status: AC
  Administered 2016-05-25: 30 mg via INTRAVENOUS

## 2016-05-25 MED ORDER — CEFAZOLIN SODIUM-DEXTROSE 2-4 GM/100ML-% IV SOLN
2.0000 g | INTRAVENOUS | Status: AC
  Administered 2016-05-25: 2 g via INTRAVENOUS
  Filled 2016-05-25: qty 100

## 2016-05-25 MED ORDER — LACTATED RINGERS IR SOLN
Status: DC | PRN
  Administered 2016-05-25: 1000 mL

## 2016-05-25 MED ORDER — SULFAMETHOXAZOLE-TRIMETHOPRIM 800-160 MG PO TABS: 1.0000 | ORAL_TABLET | Freq: Two times a day (BID) | ORAL | Status: DC

## 2016-05-25 MED ORDER — LEVETIRACETAM 750 MG PO TABS
750.0000 mg | ORAL_TABLET | Freq: Two times a day (BID) | ORAL | Status: DC
  Administered 2016-05-25 – 2016-05-26 (×2): 750 mg via ORAL
  Filled 2016-05-25 (×2): qty 1

## 2016-05-25 MED ORDER — MIDAZOLAM HCL 5 MG/5ML IJ SOLN
INTRAMUSCULAR | Status: DC | PRN
  Administered 2016-05-25: 0.5 mg via INTRAVENOUS
  Administered 2016-05-25: 1 mg via INTRAVENOUS
  Administered 2016-05-25: 0.5 mg via INTRAVENOUS

## 2016-05-25 MED ORDER — MEPERIDINE HCL 50 MG/ML IJ SOLN: 6.2500 mg | INTRAMUSCULAR | Status: DC | PRN

## 2016-05-25 MED ORDER — DEXAMETHASONE SODIUM PHOSPHATE 10 MG/ML IJ SOLN
INTRAMUSCULAR | Status: AC
  Filled 2016-05-25: qty 1

## 2016-05-25 MED ORDER — PROMETHAZINE HCL 25 MG/ML IJ SOLN: 6.2500 mg | INTRAMUSCULAR | Status: DC | PRN

## 2016-05-25 MED ORDER — FENTANYL CITRATE (PF) 250 MCG/5ML IJ SOLN
INTRAMUSCULAR | Status: AC
  Filled 2016-05-25: qty 5

## 2016-05-25 MED ORDER — ACETAMINOPHEN 10 MG/ML IV SOLN
INTRAVENOUS | Status: DC | PRN
  Administered 2016-05-25: 1000 mg via INTRAVENOUS

## 2016-05-25 MED ORDER — HYDROMORPHONE HCL 1 MG/ML IJ SOLN
0.5000 mg | INTRAMUSCULAR | Status: DC | PRN
  Filled 2016-05-25: qty 1

## 2016-05-25 MED ORDER — ACETAMINOPHEN 500 MG PO TABS
1000.0000 mg | ORAL_TABLET | Freq: Four times a day (QID) | ORAL | Status: DC
  Administered 2016-05-25 – 2016-05-26 (×3): 1000 mg via ORAL
  Filled 2016-05-25 (×3): qty 2

## 2016-05-25 MED ORDER — PROPOFOL 10 MG/ML IV BOLUS
INTRAVENOUS | Status: AC
  Filled 2016-05-25: qty 20

## 2016-05-25 MED ORDER — ACETAMINOPHEN 10 MG/ML IV SOLN
INTRAVENOUS | Status: AC
  Filled 2016-05-25: qty 100

## 2016-05-25 MED ORDER — ROCURONIUM BROMIDE 100 MG/10ML IV SOLN
INTRAVENOUS | Status: DC | PRN
  Administered 2016-05-25 (×4): 10 mg via INTRAVENOUS
  Administered 2016-05-25: 50 mg via INTRAVENOUS

## 2016-05-25 SURGICAL SUPPLY — 69 items
APPLICATOR COTTON TIP 6IN STRL (MISCELLANEOUS) ×4 IMPLANT
BAG RETRIEVAL 10 (BASKET) ×1
BAG RETRIEVAL 10MM (BASKET) ×1
CATH FOLEY 2WAY SLVR 18FR 30CC (CATHETERS) ×4 IMPLANT
CATH TIEMANN FOLEY 18FR 5CC (CATHETERS) ×4 IMPLANT
CHLORAPREP W/TINT 26ML (MISCELLANEOUS) ×4 IMPLANT
CLIP LIGATING HEM O LOK PURPLE (MISCELLANEOUS) ×12 IMPLANT
CLOTH BEACON ORANGE TIMEOUT ST (SAFETY) ×4 IMPLANT
CONT SPECI 4OZ STER CLIK (MISCELLANEOUS) ×2 IMPLANT
COVER SURGICAL LIGHT HANDLE (MISCELLANEOUS) ×2 IMPLANT
COVER TIP SHEARS 8 DVNC (MISCELLANEOUS) ×2 IMPLANT
COVER TIP SHEARS 8MM DA VINCI (MISCELLANEOUS) ×4
CUTTER ECHEON FLEX ENDO 45 340 (ENDOMECHANICALS) ×4 IMPLANT
DECANTER SPIKE VIAL GLASS SM (MISCELLANEOUS) ×4 IMPLANT
DRAPE ARM DVNC X/XI (DISPOSABLE) ×8 IMPLANT
DRAPE COLUMN DVNC XI (DISPOSABLE) ×2 IMPLANT
DRAPE DA VINCI XI ARM (DISPOSABLE) ×8
DRAPE DA VINCI XI COLUMN (DISPOSABLE) ×2
DRAPE SURG IRRIG POUCH 19X23 (DRAPES) ×4 IMPLANT
DRSG TEGADERM 4X4.75 (GAUZE/BANDAGES/DRESSINGS) ×6 IMPLANT
DRSG TEGADERM 6X8 (GAUZE/BANDAGES/DRESSINGS) ×4 IMPLANT
ELECT REM PT RETURN 9FT ADLT (ELECTROSURGICAL) ×4
ELECTRODE REM PT RTRN 9FT ADLT (ELECTROSURGICAL) ×2 IMPLANT
GAUZE SPONGE 2X2 8PLY STRL LF (GAUZE/BANDAGES/DRESSINGS) ×2 IMPLANT
GLOVE BIO SURGEON STRL SZ 6.5 (GLOVE) ×3 IMPLANT
GLOVE BIO SURGEONS STRL SZ 6.5 (GLOVE) ×1
GLOVE BIOGEL M STRL SZ7.5 (GLOVE) ×8 IMPLANT
GLOVE BIOGEL PI IND STRL 7.5 (GLOVE) ×2 IMPLANT
GLOVE BIOGEL PI INDICATOR 7.5 (GLOVE) ×2
GOWN STRL REUS W/TWL LRG LVL3 (GOWN DISPOSABLE) ×8 IMPLANT
GOWN STRL REUS W/TWL LRG LVL4 (GOWN DISPOSABLE) ×12 IMPLANT
HEMOSTAT SURGICEL 4X8 (HEMOSTASIS) ×2 IMPLANT
HOLDER FOLEY CATH W/STRAP (MISCELLANEOUS) ×4 IMPLANT
IV LACTATED RINGERS 1000ML (IV SOLUTION) ×2 IMPLANT
KIT PROCEDURE DA VINCI SI (MISCELLANEOUS)
KIT PROCEDURE DVNC SI (MISCELLANEOUS) ×2 IMPLANT
LIQUID BAND (GAUZE/BANDAGES/DRESSINGS) ×4 IMPLANT
NDL INSUFFLATION 14GA 120MM (NEEDLE) ×2 IMPLANT
NDL SPNL 22GX7 QUINCKE BK (NEEDLE) ×2 IMPLANT
NEEDLE INSUFFLATION 14GA 120MM (NEEDLE) ×4 IMPLANT
NEEDLE SPNL 22GX7 QUINCKE BK (NEEDLE) ×4 IMPLANT
PACK ROBOT UROLOGY CUSTOM (CUSTOM PROCEDURE TRAY) ×4 IMPLANT
PAD POSITIONING PINK XL (MISCELLANEOUS) ×2 IMPLANT
PORT ACCESS TROCAR AIRSEAL 12 (TROCAR) ×2 IMPLANT
PORT ACCESS TROCAR AIRSEAL 5M (TROCAR) ×2
RELOAD GREEN ECHELON 45 (STAPLE) ×4 IMPLANT
SEAL CANN UNIV 5-8 DVNC XI (MISCELLANEOUS) ×8 IMPLANT
SEAL XI 5MM-8MM UNIVERSAL (MISCELLANEOUS) ×8
SET TRI-LUMEN FLTR TB AIRSEAL (TUBING) ×4 IMPLANT
SET TUBE IRRIG SUCTION NO TIP (IRRIGATION / IRRIGATOR) ×4 IMPLANT
SHEET LAVH (DRAPES) ×2 IMPLANT
SOLUTION ELECTROLUBE (MISCELLANEOUS) ×4 IMPLANT
SPONGE GAUZE 2X2 STER 10/PKG (GAUZE/BANDAGES/DRESSINGS) ×2
SPONGE LAP 4X18 X RAY DECT (DISPOSABLE) ×4 IMPLANT
SUT ETHILON 3 0 PS 1 (SUTURE) ×4 IMPLANT
SUT MNCRL AB 4-0 PS2 18 (SUTURE) ×8 IMPLANT
SUT PDS AB 1 CT1 27 (SUTURE) ×8 IMPLANT
SUT VIC AB 2-0 SH 27 (SUTURE) ×4
SUT VIC AB 2-0 SH 27X BRD (SUTURE) ×2 IMPLANT
SUT VICRYL 0 UR6 27IN ABS (SUTURE) ×4 IMPLANT
SUT VLOC BARB 180 ABS3/0GR12 (SUTURE) ×12
SUTURE VLOC BRB 180 ABS3/0GR12 (SUTURE) ×6 IMPLANT
SYR 27GX1/2 1ML LL SAFETY (SYRINGE) ×2 IMPLANT
SYS BAG RETRIEVAL 10MM (BASKET) ×2
SYSTEM BAG RETRIEVAL 10MM (BASKET) IMPLANT
TOWEL OR 17X26 10 PK STRL BLUE (TOWEL DISPOSABLE) ×4 IMPLANT
TOWEL OR NON WOVEN STRL DISP B (DISPOSABLE) ×4 IMPLANT
WATER STERILE IRR 1000ML POUR (IV SOLUTION) ×2 IMPLANT
WATER STERILE IRR 1500ML POUR (IV SOLUTION) ×4 IMPLANT

## 2016-05-25 NOTE — Anesthesia Postprocedure Evaluation (Signed)
Anesthesia Post Note  Patient: Benjamin [name]  Procedure(s) Performed: Procedure(s) (LRB): XI ROBOTIC ASSISTED LAPAROSCOPIC RADICAL PROSTATECTOMY WITH INDOCYANINE GREEN DYE (N/A) BILATERAL PELVIC LYMPHADENECTOMY (Bilateral)  Patient location during evaluation: PACU Anesthesia Type: General Level of consciousness: sedated Pain management: pain level controlled Vital Signs Assessment: post-procedure vital signs reviewed and stable Respiratory status: spontaneous breathing Cardiovascular status: stable Postop Assessment: no signs of nausea or vomiting Anesthetic complications: no     Last Vitals:  Filed Vitals:   05/25/16 1700 05/25/16 1715  BP: 140/96 137/94  Pulse: 70 69  Temp: 36.6 C   Resp: 16 15    Last Pain:  Filed Vitals:   05/25/16 1716  PainSc: Asleep   Pain Goal: Patients Stated Pain Goal: 4 (05/25/16 1151)               [place]

## 2016-05-25 NOTE — H&P (Signed)
Benjamin [name] is an 65 y.o. male.    Chief Complaint: Pre-Op Robotic Prostatectomy with Pelvic Lymphadenectomy  HPI:   1 - Moderate Risk Prostate Cancer - 4/12 cores 3+4=7 adenocarcinoma up to 40% of cores including bilateral apical and lateral positivity, 1/12 cores Gleason 6 by BX 01/2016 on surveillance of what was previously lower-grade cancer on surveillance. Initial DX 2013. Most recent PSA 8.0 01/2016 , TRUS 8m, no median lobe. His father had prostate cancer and surgery at late age.   PMH sig for L spine surgery (congenital stenosis made worse by few injuries, some mild LE neuropathy), Rt knee ligament injury (non-prosthetic surgery pending). His PCP is NCyndi BenderMD.   Today " Benjamin [name]" is seen to proceed with robotic prostatectomy. No interval fevers.    Past Medical History  Diagnosis Date  . [place]   . [place]    Adenocarcinoma,gleason=3+3=6,PSA=5.0,volume=20cc  . Allergy   . Anxiety   . Depression   . Hypercholesterolemia   . Acute prostatitis 07/03/12    s/p prostate   . Back pain   . BPH (benign prostatic hypertrophy) with urinary obstruction   . ED (erectile dysfunction)   . Arthritis     stenosis lower back  . History of ITP     as a child per patient  . Other forms of epilepsy and recurrent seizures without mention of intractable epilepsy 09/16/2013  . Headache(784.0)     "sinus" headaches  . Unspecified hereditary and idiopathic peripheral neuropathy 07/07/2014    Foot numbness starting 2010, hands beginning in 2014- 15 .  .Marland KitchenPeripheral axonal neuropathy 08/06/2014    Past Surgical History  Procedure Laterality Date  . Toe surgery    . Colonoscopy    . Lumbar laminectomy/decompression microdiscectomy Benjamin/A 12/23/2013    Procedure: Lumbar Laminectomy Decompression, Lumbar Two, Three, Four, Five;  Surgeon: RHosie Spangle MD;  Location: [facility];  Service: Neurosurgery;  Laterality: Benjamin/A;  . Knee arthroscopy  2017    Family  History  Problem Relation Age of Onset  . Cancer Father 734   prostate/prostatectomy  . Cancer Cousin 725   prostate ca, also heart problems   Social History:  reports that he has never smoked. He has never used smokeless tobacco. He reports that he drinks alcohol. He reports that he does not use illicit drugs.  Allergies:  Allergies  Allergen Reactions  . Aspartame And Phenylalanine     Powder causes seizures  . Benadryl [Diphenhydramine]     seizures    No prescriptions prior to admission    Results for orders placed or performed during the hospital encounter of 05/23/16 (from the past 48 hour(s))  ABO/Rh     Status: None   Collection Time: 05/23/16  9:00 AM  Result Value Ref Range   ABO/RH(D) A POS   CBC     Status: None   Collection Time: 05/23/16  9:10 AM  Result Value Ref Range   WBC 6.2 4.0 - 10.5 K/uL   RBC 4.79 4.22 - 5.81 MIL/uL   Hemoglobin 14.5 13.0 - 17.0 g/dL   HCT 42.4 39.0 - 52.0 %   MCV 88.5 78.0 - 100.0 fL   MCH 30.3 26.0 - 34.0 pg   MCHC 34.2 30.0 - 36.0 g/dL   RDW 12.8 11.5 - 15.5 %   Platelets 185 150 - 400 K/uL  Basic metabolic panel     Status: Abnormal   Collection Time:  05/23/16  9:10 AM  Result Value Ref Range   Sodium 136 135 - 145 mmol/L   Potassium 4.2 3.5 - 5.1 mmol/L   Chloride 105 101 - 111 mmol/L   CO2 24 22 - 32 mmol/L   Glucose, Bld 140 (H) 65 - 99 mg/dL   BUN 18 6 - 20 mg/dL   Creatinine, Ser 0.95 0.61 - 1.24 mg/dL   Calcium 8.5 (L) 8.9 - 10.3 mg/dL   GFR calc non Af Amer >60 >60 mL/min   GFR calc Af Amer >60 >60 mL/min    Comment: (NOTE) The eGFR has been calculated using the CKD EPI equation. This calculation has not been validated in all clinical situations. eGFR's persistently <60 mL/min signify possible Chronic Kidney Disease.    Anion gap 7 5 - 15  Type and screen All Cardiac and thoracic surgeries, spinal fusions, myomectomies, craniotomies, colon & liver resections, total joint revisions, same day c-section with  placenta previa or accreta.     Status: None   Collection Time: 05/23/16  9:10 AM  Result Value Ref Range   ABO/RH(D) A POS    Antibody Screen NEG    Sample Expiration 06/06/2016    Extend sample reason NO TRANSFUSIONS OR PREGNANCY IN THE PAST 3 MONTHS    No results found.  Review of Systems  Constitutional: Negative.  Negative for fever and chills.  HENT: Negative.   Eyes: Negative.   Respiratory: Negative.   Cardiovascular: Negative.   Gastrointestinal: Negative.   Genitourinary: Negative.   Musculoskeletal: Negative.   Skin: Negative.   Neurological: Negative.   Endo/Heme/Allergies: Negative.   Psychiatric/Behavioral: Negative.     There were no vitals taken for this visit. Physical Exam  Constitutional: He is oriented to person, place, and time. He appears well-developed and well-nourished.  HENT:  Head: Normocephalic.  Eyes: Pupils are equal, round, and reactive to light.  Neck: Normal range of motion.  Cardiovascular: Normal rate.   Respiratory: Effort normal.  GI: Soft.  Genitourinary:  No CVAT  Musculoskeletal: Normal range of motion.  Neurological: He is alert and oriented to person, place, and time.  Skin: Skin is warm.  Psychiatric: He has a normal mood and affect. His behavior is normal. Judgment and thought content normal.     Assessment/Plan  1 - Moderate Risk Prostate Cancer - agree primary therapy warranted with progression of volume and grade of disease. Frankly discussed that with bilateral lateral and apical positivity I would recommend limited nerve sparing with surgery.  He has very good understanding of management options (surveillance, ablative therapies, surgery) and has met with radiation oncology as well previously.  We rediscussed prostatectomy and specifically robotic prostatectomy with bilateral pelvic lymphadenectomy being the technique that I most commonly perform. I showed the patient on their abdomen the approximately 6 small incision  (trocar) sites as well as presumed extraction sites with robotic approach as well as possible open incision sites should open conversion be necessary. We rediscussed peri-operative risks including bleeding, infection, deep vein thrombosis, pulmonary embolism, compartment syndrome, nuropathy / neuropraxia, heart attack, stroke, death, as well as long-term risks such as non-cure / need for additional therapy. We specifically readdressed that the procedure would compromise urinary control leading to stress incontinence which typically resolves with time and pelvic rehabilitation (Kegel's, etc..), but can sometimes be permanent and require additional therapy including surgery. We also specifically addressed sexual sequellae including significant erectile dysfunction which typically partially resolves with time but can also be permanent and  require additional therapy including surgery.   We rediscussed the typical hospital course including usual 1-2 night hospitalization, discharge with foley catheter in place usually for 1-2 weeks before voiding trial as well as usually 2 week recovery until able to perform most non-strenuous activity and 6 weeks until able to return to most jobs and more strenuous activity such as exercise.     Alexis Frock, MD 05/25/2016, 6:14 AM

## 2016-05-25 NOTE — Brief Op Note (Signed)
05/25/2016  4:44 PM  PATIENT:  Benjamin [name]  65 y.o. male  PRE-OPERATIVE DIAGNOSIS:  MODERATE RISK PROSTATE CANCER  POST-OPERATIVE DIAGNOSIS:  moderate risk prostate cancer  PROCEDURE:  Procedure(s): XI ROBOTIC ASSISTED LAPAROSCOPIC RADICAL PROSTATECTOMY WITH INDOCYANINE GREEN DYE (N/A) BILATERAL PELVIC LYMPHADENECTOMY (Bilateral)  SURGEON:  Surgeon(s) and Role:    * Alexis Frock, MD - Primary  PHYSICIAN ASSISTANT:   ASSISTANTS: Debbrah Alar   ANESTHESIA:   local and general  EBL:  Total I/O In: 1400 [I.V.:1400] Out: 150 [Blood:150]  BLOOD ADMINISTERED:none  DRAINS: JP to bulb, Foley to gravity   LOCAL MEDICATIONS USED:  MARCAINE     SPECIMEN:  Source of Specimen:  prostatectomy, bilateral pelvic lymph nodes  DISPOSITION OF SPECIMEN:  PATHOLOGY  COUNTS:  YES  TOURNIQUET:  * No tourniquets in log *  DICTATION: .Other Dictation: Dictation Number [id number]  PLAN OF CARE: Admit to inpatient   PATIENT DISPOSITION:  PACU - hemodynamically stable.   Delay start of Pharmacological VTE agent (>24hrs) due to surgical blood loss or risk of bleeding: yes

## 2016-05-25 NOTE — Anesthesia Procedure Notes (Signed)
Procedure Name: Intubation Date/Time: 05/25/2016 12:57 PM Performed by: Ofilia Neas Pre-anesthesia Checklist: Patient identified, Timeout performed, Emergency Drugs available, Suction available and Patient being monitored Patient Re-evaluated:Patient Re-evaluated prior to inductionOxygen Delivery Method: Circle system utilized Preoxygenation: Pre-oxygenation with 100% oxygen Intubation Type: IV induction and Cricoid Pressure applied Ventilation: Mask ventilation without difficulty Laryngoscope Size: Mac and 4 Grade View: Grade III Tube type: Oral Tube size: 7.5 mm Number of attempts: 1 Airway Equipment and Method: Stylet Placement Confirmation: ETT inserted through vocal cords under direct vision,  positive ETCO2 and breath sounds checked- equal and bilateral Secured at: 21 cm Tube secured with: Tape Dental Injury: Teeth and Oropharynx as per pre-operative assessment  Difficulty Due To: Difficulty was anticipated, Difficult Airway- due to large tongue, Difficult Airway- due to reduced neck mobility, Difficult Airway- due to limited oral opening, Difficult Airway- due to anterior larynx and Difficult Airway- due to dentition Future Recommendations: Recommend- induction with short-acting agent, and alternative techniques readily available Comments: Anterior.  Cords minimally visualized with head lift/cricoid pressure.

## 2016-05-25 NOTE — Discharge Summary (Signed)
Date of admission: 05/25/2016  Date of discharge: 05/25/2016  Admission diagnosis: Prostate Cancer  Discharge diagnosis: Prostate Cancer  History and Physical: For full details, please see admission history and physical. Briefly, Hampton Wixom is a 65 y.o. gentleman with localized prostate cancer.  After discussing management/treatment options, he elected to proceed with surgical treatment.  Hospital Course: Whalen Trompeter was taken to the operating room on 05/25/2016 and underwent a robotic assisted laparoscopic radical prostatectomy. He tolerated this procedure well and without complications. Postoperatively, he was able to be transferred to a regular hospital room following recovery from anesthesia.  He was able to begin ambulating the night of surgery. He remained hemodynamically stable overnight.  He had excellent urine output with appropriately minimal output from his pelvic drain and his pelvic drain was removed on POD #1.  He was transitioned to oral pain medication, tolerated a clear liquid diet, and had met all discharge criteria and was able to be discharged home later on POD#1.  Laboratory values:   Recent Labs  05/23/16 0910  HGB 14.5  HCT 42.4    Disposition: Home  Discharge instruction:      Discharge Instructions    Discharge instructions    Complete by:  As directed   Activity:  You are encouraged to ambulate frequently (about every hour during waking hours) to help prevent blood clots from forming in your legs or lungs.  However, you should not engage in any heavy lifting (> 10-15 lbs), strenuous activity, or straining. Diet: You should continue a clear liquid diet until passing gas from below.  Once this occurs, you may advance your diet to a soft diet that would be easy to digest (i.e soups, scrambled eggs, mashed potatoes, etc.) for 24 hours just as you would if getting over a bad stomach flu.  If tolerating this diet well for 24 hours, you may then begin eating regular  food.  It will be normal to have some amount of bloating, nausea, and abdominal discomfort intermittently. Prescriptions:  You will be provided a prescription for pain medication to take as needed.  If your pain is not severe enough to require the prescription pain medication, you may take extra strength Tylenol instead.  You should also take an over the counter stool softener (Colace 100 mg twice daily) to avoid straining with bowel movements as the pain medication may constipate you. Finally, you will also be provided a prescription for an antibiotic to begin the day prior to your return visit in the office for catheter removal. Catheter care: You will be taught how to take care of the catheter by the nursing staff prior to discharge from the hospital.  You may use both a leg bag and the larger bedside bag but it is recommended to at least use the bigger bedside bag at nighttime as the leg bag is small and will fill up overnight and also does not drain as well when lying flat. You may periodically feel a strong urge to void with the catheter in place.  This is a bladder spasm and most often can occur when having a bowel movement or when you are moving around. It is typically self-limited and usually will stop after a few minutes.  You may use some Vaseline or Neosporin around the tip of the catheter to reduce friction at the tip of the penis. Incisions: You may remove your dressing bandages the 2nd day after surgery.  You most likely will have a few small staples in  each of the incisions and once the bandages are removed, the incisions may stay open to air.  You may start showering (not soaking or bathing in water) 48 hours after surgery and the incisions simply need to be patted dry after the shower.  No additional care is needed. What to call us about: You should call the office [phone]) if you develop fever > 101, persistent vomiting, or the catheter stops draining. Also, feel free to call with any  other questions you may have and remember the handout that was provided to you as a reference preoperatively which answers many of the common questions that arise after surgery.           Discharge medications:     Medication List    TAKE these medications        HYDROcodone-acetaminophen 5-325 MG tablet  Commonly known as:  NORCO  Take 1-2 tablets by mouth every 6 (six) hours as needed for moderate pain.     levETIRAcetam 750 MG tablet  Commonly known as:  KEPPRA  Take 1 tablet (750 mg total) by mouth every 12 (twelve) hours.     sertraline 100 MG tablet  Commonly known as:  ZOLOFT  Take 100 mg by mouth daily as needed (when feeling down). Reported on 05/03/2016     sulfamethoxazole-trimethoprim 800-160 MG tablet  Commonly known as:  BACTRIM DS,SEPTRA DS  Take 1 tablet by mouth 2 (two) times daily. Start the day prior to foley removal appointment        Followup: He will followup in 1 week for catheter removal and to discuss his surgical pathology results.

## 2016-05-25 NOTE — Anesthesia Preprocedure Evaluation (Signed)
Anesthesia Evaluation  Patient identified by MRN, date of birth, ID band Patient awake    Reviewed: Allergy & Precautions, H&P , NPO status , Patient's Chart, lab work & pertinent test results  History of Anesthesia Complications Negative for: history of anesthetic complications  Airway Mallampati: II  TM Distance: >3 FB     Dental  (+) Teeth Intact, Dental Advisory Given   Pulmonary neg pulmonary ROS,    Pulmonary exam normal        Cardiovascular negative cardio ROS Normal cardiovascular exam(-) Valvular Problems/Murmurs     Neuro/Psych Seizures -, Well Controlled,  Anxiety Spinal stenosis, last seizure > 3 years ago, took Keppra     GI/Hepatic negative GI ROS, Neg liver ROS,   Endo/Other  negative endocrine ROS  Renal/GU negative Renal ROS  negative genitourinary   Musculoskeletal   Abdominal (+) + obese,   Peds  Hematology negative hematology ROS (+)   Anesthesia Other Findings   Reproductive/Obstetrics negative OB ROS                             Anesthesia Physical  Anesthesia Plan  ASA: II  Anesthesia Plan: General   Post-op Pain Management:    Induction: Intravenous  Airway Management Planned: Oral ETT  Additional Equipment:   Intra-op Plan:   Post-operative Plan: Extubation in OR  Informed Consent: I have reviewed the patients History and Physical, chart, labs and discussed the procedure including the risks, benefits and alternatives for the proposed anesthesia with the patient or authorized representative who has indicated his/her understanding and acceptance.   Dental advisory given  Plan Discussed with: CRNA, Surgeon and Anesthesiologist  Anesthesia Plan Comments:         Anesthesia Quick Evaluation

## 2016-05-25 NOTE — Discharge Instructions (Signed)

## 2016-05-25 NOTE — Transfer of Care (Signed)
Immediate Anesthesia Transfer of Care Note  Patient: Benjamin [name]  Procedure(s) Performed: Procedure(s): XI ROBOTIC ASSISTED LAPAROSCOPIC RADICAL PROSTATECTOMY WITH INDOCYANINE GREEN DYE (N/A) BILATERAL PELVIC LYMPHADENECTOMY (Bilateral)  Patient Location: PACU  Anesthesia Type:General  Level of Consciousness: awake, patient cooperative, lethargic and responds to stimulation  Airway & Oxygen Therapy: Patient Spontanous Breathing and Patient connected to face mask oxygen  Post-op Assessment: Report given to RN, Post -op Vital signs reviewed and stable and Patient moving all extremities  Post vital signs: Reviewed and stable  Last Vitals:  Filed Vitals:   05/25/16 1107  BP: 128/98  Pulse: 71  Temp: 36.5 C  Resp: 18    Last Pain:  Filed Vitals:   05/25/16 1301  PainSc: 3       Patients Stated Pain Goal: 4 (0000000 [id number])  Complications: No apparent anesthesia complications

## 2016-05-26 LAB — HEMOGLOBIN AND HEMATOCRIT, BLOOD
HEMATOCRIT: 38.4 % — AB (ref 39.0–52.0)
HEMOGLOBIN: 13.2 g/dL (ref 13.0–17.0)

## 2016-05-26 LAB — BASIC METABOLIC PANEL
ANION GAP: 6 (ref 5–15)
BUN: 14 mg/dL (ref 6–20)
CALCIUM: 8.1 mg/dL — AB (ref 8.9–10.3)
CO2: 25 mmol/L (ref 22–32)
Chloride: 105 mmol/L (ref 101–111)
Creatinine, Ser: 0.98 mg/dL (ref 0.61–1.24)
Glucose, Bld: 199 mg/dL — ABNORMAL HIGH (ref 65–99)
Potassium: 4.3 mmol/L (ref 3.5–5.1)
Sodium: 136 mmol/L (ref 135–145)

## 2016-05-26 MED ORDER — PHENOL 1.4 % MT LIQD
1.0000 | OROMUCOSAL | Status: DC | PRN
  Filled 2016-05-26: qty 177

## 2016-05-26 NOTE — Op Note (Addendum)
[name], [name] NO.:  [id number]  MEDICAL RECORD NO.:  [id number]  LOCATION:  49                         FACILITY:  [facility]  PHYSICIAN:  Alexis Frock, MD     DATE OF BIRTH:  [birth year]  DATE OF PROCEDURE: 05/25/2016                              OPERATIVE REPORT  DIAGNOSIS:  Moderate-risk prostate cancer bilaterally.  PROCEDURES: 1. Robotic-assisted laparoscopic radical prostatectomy. 2. Bilateral pelvic lymphadenectomy. 3. Injection of indocyanine green dye for sentinel lymphangiography.  ESTIMATED BLOOD LOSS:  150 mL.  COMPLICATION:  None.  ASSISTANT: Debbrah Alar, PA  SPECIMENS: 1. Radical prostatectomy. 2. Right external iliac lymph nodes. 3. Right external iliac lymph node, sentinel. 4. Right obturator lymph nodes. 5. Left external iliac lymph node, sentinel. 6. Left obturator lymph nodes. 7. Periprostatic fat all for permanent pathology.  DRAINS: 1. Jackson-Pratt drain to bulb suction. 2. Foley catheter to straight drain.  FINDINGS: 1. Small prostate with prominent posterior midline nodule as     anticipated. 2. Bilateral sentinel lymphatic channel seen coursing towards the     pelvic lymph node packet with right external and left external     sentinel lymph nodes noted.  INDICATION:  [name] is a very pleasant 64 year old gentleman, who was initially on active surveillance for low-risk adenocarcinoma of the prostate.  He was found on surveillance protocol to have progression of disease in terms of volume and grade with bilateral Gleason 7 disease including bilateral apical positivity.  Options were discussed for management including continued surveillance protocols versus ablative therapy versus surgical extirpation, and the patient adamantly wished to proceed with robotic prostatectomy.  We had discussed preoperatively the implications of bilateral lateral and apical disease and I had recommended minimal nerve-sparing approach  and he understands this. Informed consent was obtained and placed in the medical record.  PROCEDURE IN DETAIL:  The patient being Benjamin [name], was verified. Procedure being robotic prostatectomy was confirmed.  Procedure was carried out.  Time-out was performed.  Intravenous antibiotics were administered.  General endotracheal anesthesia was introduced.  The patient was placed into a low lithotomy position.  Sterile field was created by prepping and draping the patient's penis, perineum, and proximal thighs using iodine x3.  Next, additional sterile field was created by prepping his infra-xiphoid abdomen using chlorhexidine gluconate, using clipper shaving.  He was further fashioned to the operating table using 3-inch tape over foam padding across his chest.  A test of steep Trendelenburg positioning was performed and was found to be suitably positioned.  Next, a high-flow, low-pressure pneumoperitoneum was obtained using Veress technique in the supraumbilical midline having passed the aspiration and drop test. Next, an 8-mm robotic camera port was placed into this location. Laparoscopic examination of the peritoneal cavity revealed no significant adhesions and no visceral injury.  Additional ports were then placed as follows:  Right paramedian 8-mm robotic port, right far lateral 12-mm AirSeal assistant port and right paramedian superior 5-mm suction port, left paramedian 8-mm robotic port, left far lateral 8-mm robotic port.  Robot was docked and passed through the electronic checks.  Initial attention was directed to development of the lateral bladder.  Incision was made lateral to  the left medial umbilical ligament from the midline towards the area of the internal ring.  The left vas deferens was encountered, purposely ligated and placed on gentle medial traction.  Dissection was proceeded along the course of the iliac vessels towards the area of the ureter, which was  positively identified and uninjured.  Left bladder wall was swept away from the pelvic sidewall towards the area of the endopelvic fascia.  A mirror- imaged dissection was performed on the right side.  Anterior attachments were taken down using cautery scissors.  This exposed the anterior base of the prostate, which was defatted to allow better demarcation of the bladder neck and prostate junction.  With this, fast set aside, labeled periprostatic fat.  Next, using a percutaneously-placed 18-gauge robotically-guided spinal needle, 0.2 mL of indocyanine green dye was injected into each lobe of the prostate under laparoscopic vision with intervening suction to prevent dye spillage, which did not occur.  Next, the endopelvic fascia was carefully swept away from the lateral aspect of the prostate in a base-to-apex orientation.  This exposed the dorsal venous complex, which was controlled using vascular load stapler.  It had been approximately 10 minutes post-dye injection and the pelvis was inspected under near-infrared fluorescence light.  Sentinel lymphangiography revealed lymphatic channels coursing toward the area of the pelvic lymph node fields bilaterally.  There was some sentinel lymph nodes noted in the right external and left external iliac lymph node packets respectively.  Next, template lymphadenectomy was performed first on the right side of the right external iliac packet with confines being right external iliac artery, vein, pelvic side wall and common iliac bifurcation.  Lymphostasis was achieved with cold clips.  This was labeled right external iliac lymph nodes as separate packet.  The lymph nodes marked proximal to this towards the common iliac bifurcation of the sentinel component and this was set aside separately, labeled the right external iliac lymph node, sentinel.  The obturator packet was then developed with the confines being obturator nerve, pelvic side wall,  external iliac vein.  Lymphostasis was again achieved with cold clips.  This was set aside and labeled right obturator lymph nodes.  The obturator nerve was inspected following these maneuvers and found to be visibly intact.  A mirror-imaged lymphadenectomy was performed on the left side of the left external iliac and left obturator groups respectively.  Left external iliac packet contained several sentinel lymph node and this was labeled as such.  Left obturator nerve was inspected and found to be intact.  The bladder neck was then identified by moving the Foley catheter back and forth in the anterior plane.  A lateral release technique was performed and the bladder neck was separated from the base of the prostate to anterior-posterior direction keeping what appeared to be circular muscle fibers at each side of the specimen.  The posterior dissection was performed by incising approximately 7 mm inferior-posterior to the posterior lip of the prostate and entering the plane of Denonvilliers. Bilateral vas deferens were encountered and dissected for distance of approximately 4 cm, ligated and placed on gentle superior traction. Bilateral seminal vesicles were dissected to their tips and placed on gentle superior traction.  Posterior dissection continued in the midline towards the apex of the prostate.  This exposed the vascular pedicles bilaterally.  A purposeful wide dissection was performed first on the left side using a sequential clipping technique in a base-to-apex orientation followed by sequential clipping technique of the right neurovascular pedicles toward the  area of the apex of the prostate. Again, taking a purposeful wide dissection given the patient's bilateral lateral positivity.  Apical dissection was performed in the anterior plane by placing the prostate in gentle superior traction and the Foley catheter in place.  The membranous urethra was identified and  transected coldly, purposely rather distally given bilateral apical positivity. This completely freed up the prostatectomy specimen, which was placed into an EndoCatch bag for later retrieval.  Digital rectal exam was then performed under laparoscopic vision using indicator glove and no evidence of rectal violation was noted.  Posterior reconstruction was then performed using a single V-Loc suture in a running fashion, reapproximating the posterior urethral plate to the posterior bladder neck bringing these structures into tension-free apposition.  Bladder neck to urethra, mucosal anastomosis was performed using double-armed 0 V-Loc suture from the 6 o'clock to 12 o'clock position bilaterally and anchoring the stitch to the puboprostatic ligament, thus performing anterior reconstruction as well.  The Foley catheter was then placed, which irrigated quantitatively.  Following these maneuvers, hemostasis was appeared excellent.  All sponge and needle counts were correct.  A closed suction drain was brought through the previous left lateral robotic port site to the area of the peritoneal cavity.  Robot was then undocked.  The previous right lateral most assistant port site was closed at the level of the fascia using Carter-Thomason suture passer and 0 Vicryl.  Specimen was retrieved by extending the previous camera port site inferiorly for total distance approximately 4 cm, removing the prostatectomy specimen, setting aside for permanent pathology.  The extraction site was closed at the level of the fascia using figure-of- eight PDS x3 followed by reapproximation of the Scarpa's.  All incision sites were infiltrated with dilute lyophilized Marcaine and closed at the level of the skin using subcuticular Monocryl followed by Dermabond, and procedure was terminated.  The patient tolerated the procedure well. There were immediate periprocedural complications.  The patient was taken to the  postanesthesia care unit in stable condition.          ______________________________ Alexis Frock, MD     TM/MEDQ  D:  05/25/2016  T:  05/26/2016  Job:  [id number]

## 2016-05-26 NOTE — Progress Notes (Signed)
1 Day Post-Op Subjective: The patient is doing well.  No nausea or vomiting. Pain is adequately controlled.  Objective: Vital signs in last 24 hours: Temp:  [97.3 F (36.3 C)-98.6 F (37 C)] 98.6 F (37 C) (07/15 0518) Pulse Rate:  [69-84] 74 (07/15 0518) Resp:  [11-16] 14 (07/15 0518) BP: (108-143)/(67-96) 120/81 mmHg (07/15 0518) SpO2:  [93 %-100 %] 100 % (07/15 0518) FiO2 (%):  [2 %] 2 % (07/14 1815) Weight:  [101.152 kg (223 lb)] 101.152 kg (223 lb) (07/14 1815)  Intake/Output from previous day: 07/14 0701 - 07/15 0700 In: 3060 [P.O.:240; I.V.:1600; IV Piggyback:1000] Out: [id number] [Urine:975; Drains:120; Blood:150] Intake/Output this shift: Total I/O In: -  Out: 900 [Urine:900]  Physical Exam:  General: Alert and oriented. GI: Soft, Nondistended. Incisions: Dressings intact. Urine: Clear Extremities: Nontender, no erythema, no edema.  Lab Results:  Recent Labs  05/25/16 1724 05/26/16 0418  HGB 13.9 13.2  HCT 41.0 38.4*    Recent Labs  05/26/16 0418  NA 136  K 4.3  CL 105  CO2 25  GLUCOSE 199*  BUN 14  CREATININE 0.98  CALCIUM 8.1*      Assessment/Plan: POD# 1 s/p robotic prostatectomy.  1) SL IVF 2) Ambulate, Incentive spirometry 3) Transition to oral pain medication 4) D/C pelvic drain 5) Plan for likely discharge later today   LOS: 1 day   Louis Meckel W 05/26/2016, 11:10 AM

## 2016-05-28 ENCOUNTER — Encounter (HOSPITAL_COMMUNITY): Payer: Self-pay | Admitting: Urology

## 2016-07-30 ENCOUNTER — Encounter: Payer: Self-pay | Admitting: Physical Therapy

## 2016-07-30 ENCOUNTER — Ambulatory Visit: Payer: 59 | Attending: Urology | Admitting: Physical Therapy

## 2016-07-30 DIAGNOSIS — M6281 Muscle weakness (generalized): Secondary | ICD-10-CM | POA: Diagnosis present

## 2016-07-30 DIAGNOSIS — Z483 Aftercare following surgery for neoplasm: Secondary | ICD-10-CM | POA: Diagnosis present

## 2016-07-30 NOTE — Patient Instructions (Addendum)
Repeat __10__ times per set. Do __1__ sets per session. Do __1  Cat / Cow Flow    Inhale, press spine toward ceiling like a Halloween cat. Keeping strength in arms and abdominals, exhale to soften spine through neutral and into cow pose. Open chest and arch back. Initiate movement between cat and cow at tailbone, one vertebrae at a time. Repeat _10___ times.  Copyright  VHI. All rights reserved.  Piriformis Stretch, Sitting    Sit, one ankle on opposite knee, same-side hand on crossed knee. Push down on knee, keeping spine straight. Lean torso forward, with flat back, until tension is felt in hamstrings and gluteals of crossed-leg side. Hold _30__ seconds.  Repeat __2_ times per session. Do _1__ sessions per day.  Copyright  VHI. All rights reserved.    Chair Sitting    Sit at edge of seat, spine straight, one leg extended. Put a hand on each thigh and bend forward from the hip, keeping spine straight. Allow hand on extended leg to reach toward toes. Support upper body with other arm. Hold _30__ seconds. Repeat __2_ times per session. Do __1_ sessions per day.  Copyright  VHI. All rights reserved.  Quads / HF, Side-Lying    Lie on one side, legs bent. Hold foot of top leg with same-side hand. Raise leg. Hold _30__ seconds.  Repeat _2__ times per session. Do _1__ sessions per day. Use a belt or sheet around foot. Copyright  VHI. All rights reserved.    Certain foods and liquids will decrease the pH making the urine more acidic.  Urinary urgency increases when the urine has a low pH.  Most common irritants: alcohol, carbonated beverages and caffinated beverages.  Foods to avoid: apple juice, apples, ascorbic acid, canteloupes, chili, citrus fruits, coffee, cranberries, grapes, guava, peaches, pepper, pineapple, plums, strawberries, tea, tomatoes, and vinegar.  Drinking plenty of water may help to increase the pH and dilute out any of the effects of specific  irritants.  Foods that are NOT irritating to the bladder include: Pears, papayas, sun-brewed teas, watermelons, non-citrus herbal teas, apricots, kava and low-acid instant drinks (Postum)  Clamp Protocol 1.  Fit penile clamp-Weisner or Dribblestop 2. Wear during the waking hours ONLY 3. Wear for a minimum of 2 hours, max 4 hours 4. Wear 6 days/week 7. Use in combination of medication that doctor may give you        Butterfly, Supine    Lie on back propped up , feet together. Lower knees toward floor.Massage the pelvic floor muscle   Do for 2-5 min to reduce soreness as you sit.  Do _1__ sessions per day.  Copyright  VHI. All rights reserved.  Adduction: Hip - Knees Together (Sitting)    Sit with towel roll between knees. Push knees together. Hold for __5_ seconds. Rest for _5__ seconds. Repeat 10___ times. Do _3__ times a day. Or can do 2-3 while waiting a stop light  Copyright  VHI. All rights reserved.  External Rotation: Hip - Knees Apart (Sitting)    Sit, band tied just above knees. Pull knees apart. Hold for _5__ seconds. Rest for _5__ seconds. Repeat _5__ times. Do _2__ times a day.  Copyright  VHI. All rights reserved.  [place] [address], [place], [place] [address] Phone # [phone] Fax [phone]

## 2016-07-30 NOTE — Therapy (Signed)
[facility] [address] [place], [place], [address] Phone: [phone]   Fax:  [phone]  Physical Therapy Treatment  Patient Details  Name: Benjamin [name] MRN: [id number] Date of Birth: July 22, 1951 Referring Provider: Dr. Alexis Frock  Encounter Date: 07/30/2016      PT End of Session - 07/30/16 1010    Visit Number 3   Date for PT Re-Evaluation 10/19/16   PT Start Time 0930   PT Stop Time 1011   PT Time Calculation (min) 41 min   Activity Tolerance Patient tolerated treatment well   Behavior During Therapy [facility] for tasks assessed/performed      Past Medical History:  Diagnosis Date  . Acute prostatitis 07/03/12   s/p prostate   . Allergy   . Anxiety   . Arthritis    stenosis lower back  . Back pain   . BPH (benign prostatic hypertrophy) with urinary obstruction   . Depression   . ED (erectile dysfunction)   . Headache(784.0)    "sinus" headaches  . History of ITP    as a child per patient  . Hypercholesterolemia   . Other forms of epilepsy and recurrent seizures without mention of intractable epilepsy 09/16/2013  . Peripheral axonal neuropathy 08/06/2014  . Prostate cancer ([place]) 07/03/12   Adenocarcinoma,gleason=3+3=6,PSA=5.0,volume=20cc  . Seizures ([place])   . Unspecified hereditary and idiopathic peripheral neuropathy 07/07/2014   Foot numbness starting 2010, hands beginning in 2014- 15 .    Past Surgical History:  Procedure Laterality Date  . COLONOSCOPY    . KNEE ARTHROSCOPY  2017  . LUMBAR LAMINECTOMY/DECOMPRESSION MICRODISCECTOMY N/A 12/23/2013   Procedure: Lumbar Laminectomy Decompression, Lumbar Two, Three, Four, Five;  Surgeon: Hosie Spangle, MD;  [facility]: [facility];  Service: Neurosurgery;  Laterality: N/A;  . LYMPHADENECTOMY Bilateral 05/25/2016   Procedure: BILATERAL PELVIC LYMPHADENECTOMY;  Surgeon: Alexis Frock, MD;  Location: [facility];  Service: Urology;  Laterality: Bilateral;   . ROBOT ASSISTED LAPAROSCOPIC RADICAL PROSTATECTOMY N/A 05/25/2016   Procedure: XI ROBOTIC ASSISTED LAPAROSCOPIC RADICAL PROSTATECTOMY WITH INDOCYANINE GREEN DYE;  Surgeon: Alexis Frock, MD;  Location: [facility];  Service: Urology;  Laterality: N/A;  . TOE SURGERY      There were no vitals filed for this visit.      Subjective Assessment - 07/30/16 0935    Subjective Patient had prostectomy on 05/25/2016. Afterwards he had an infection delaying rehab.  Patient reports infection is cleared up.  Patient is using depends for urinary incontinence.  Patient changes depends 5 or more times during the day and at night has to go to the bathroom frequently.  I drink tea and diet cheerwine.    Pertinent History Prostate CA, surgery scheduled for 05/25/16.     How long can you sit comfortably? soreness in pelvis   How long can you stand comfortably? no pain   How long can you walk comfortably? no pain   Patient Stated Goals reduce urinary incontinence   Currently in Pain? Yes   Pain Score 3    Pain Location Pelvis   Pain Orientation Lower   Pain Descriptors / Indicators Sore;Sharp   Pain Type Surgical pain   Pain Onset More than a month ago   Pain Frequency Intermittent   Aggravating Factors  sitting , movement   Pain Relieving Factors lay down   Multiple Pain Sites No            [facility] PT Assessment - 07/30/16 0001  Assessment   Medical Diagnosis prostate cancer   Referring Provider Dr. Alexis Frock   Onset Date/Surgical Date 05/25/16     Precautions   Precautions Other (comment)   Precaution Comments cancer precautions     Restrictions   Weight Bearing Restrictions No     Balance Screen   Has the patient fallen in the past 6 months No   Has the patient had a decrease in activity level because of a fear of falling?  No   Is the patient reluctant to leave their home because of a fear of falling?  No     Home Ecologist residence     Prior  Function   Level of Independence Independent   Vocation Full time employment   Vocation Requirements mailman     Cognition   Overall Cognitive Status Within Functional Limits for tasks assessed     Posture/Postural Control   Posture/Postural Control Postural limitations   Postural Limitations Rounded Shoulders;Forward head;Posterior pelvic tilt     AROM   Lumbar Extension decreased by 75%   Lumbar - Right Side Bend decreased by 25%   Lumbar - Left Side Bend decreased by 25%                  Pelvic Floor Special Questions - 07/30/16 0001    Urinary Leakage Yes   How often all   Pad use 5 during day and 1 at night depends   Urinary frequency at night up every hour   Caffeine beverages yes                   PT Education - 07/30/16 1009    Education provided Yes   Education Details pelvic floor strengthening; stretches, posture, diet changes, clamp protocol   Person(s) Educated Patient   Methods Explanation;Demonstration;Handout   Comprehension Returned demonstration;Verbalized understanding          PT Short Term Goals - 05/03/16 1207      PT SHORT TERM GOAL #1   Title Pt will get full level 1-2 knee HEP for AROM, strength.    Time 1   Period Days   Status Achieved           PT Long Term Goals - 07/30/16 1016      PT LONG TERM GOAL #1   Title independent with HEP   Time 6   Period Months   Status On-going     PT LONG TERM GOAL #2   Title urinary leakage from surgery decreased >/=75%   Time 6   Period Months   Status On-going     PT LONG TERM GOAL #3   Title understand scar massage to keep mobility of tissue   Time 6   Period Months   Status Achieved     PT LONG TERM GOAL #4   Title pelvic floor strength is 4/5 to reduce urinary leakage   Time 6   Period Months   Status On-going     PT LONG TERM GOAL #5   Title reduction of wearing depends to 1 per day due to reduction in urinary incontinence   Time 4   Period Months    Status New               Plan - 07/30/16 1011    Clinical Impression Statement Patient has returned since his prostectomy on 05/25/2016.  Patient had to wait for surgery due to having an  infection.  Patient reports sorness located in the pelvic area with sitting at level 4/10 that is intermittent.  Patient wears 5 depends during the day and 1 at night.  Patient gets up every hour during the night due to the urge.  Patient was drinking tea and diet sodas but understands how it affect urinary leakage.  Patient posture consist of posterior tilted pelvis and rounded spine. He was education on correct posture so the pelvic floor can contract correctly.  Patient was educated on clamp protocol ot reduce the use of depends.  Patient will benefit from skilled therapy to improve pelvic floor strength and reduce urinary leakage and soreness.    Rehab Potential Excellent   Clinical Impairments Affecting Rehab Potential prostate cancer surgery on 05/26/2016   PT Frequency 1x / week   PT Duration Other (comment)  4 months   PT Treatment/Interventions Biofeedback;Electrical Stimulation;Therapeutic activities;Therapeutic exercise;Neuromuscular re-education;Patient/family education;Passive range of motion;Manual techniques   PT Next Visit Plan pelvic floor EMG   PT Home Exercise Plan progress as needed   Consulted and Agree with Plan of Care Patient      Patient will benefit from skilled therapeutic intervention in order to improve the following deficits and impairments:  Impaired flexibility, Decreased strength, Decreased mobility, Pain, Decreased range of motion, Postural dysfunction, Decreased activity tolerance, Increased muscle spasms  Visit Diagnosis: Muscle weakness (generalized)  Aftercare following surgery for neoplasm     Problem List Patient Active Problem List   Diagnosis Date Noted  . Peripheral axonal neuropathy 08/06/2014  . Unspecified hereditary and idiopathic peripheral  neuropathy 07/07/2014  . Lumbar stenosis with neurogenic claudication 12/23/2013  . Other forms of epilepsy and recurrent seizures without mention of intractable epilepsy 09/16/2013  . Prostate cancer [facility]) 07/03/2012    Earlie Counts, PT 07/30/16 10:18 AM   [facility] [address], [place], [place], [address] Phone: [phone]   Fax:  [phone]  Name: Benjamin [name] MRN: [id number] Date of Birth: November 28, 1950

## 2016-08-07 ENCOUNTER — Ambulatory Visit: Payer: 59 | Admitting: Physical Therapy

## 2016-08-07 ENCOUNTER — Encounter: Payer: Self-pay | Admitting: Physical Therapy

## 2016-08-07 DIAGNOSIS — Z483 Aftercare following surgery for neoplasm: Secondary | ICD-10-CM

## 2016-08-07 DIAGNOSIS — M6281 Muscle weakness (generalized): Secondary | ICD-10-CM | POA: Diagnosis not present

## 2016-08-07 NOTE — Patient Instructions (Addendum)
Keep clamp on during the day. Go to the bathroom every 2 hours. When going to the bathroom unclamp penis to urinate.  When done put clamp back on.  Do not wear clamp at night.   Quick Contraction: Gravity Resisted (Standing)    Standing, quickly squeeze then fully relax pelvic floor. Perform __1_ sets of _5__. Rest for 1___ seconds between sets. Do __3_ times a day.  Copyright  VHI. All rights reserved.  Slow Contraction: Gravity Resisted (Standing)    Standing, slowly squeeze pelvic floor for __5_ seconds. Rest for _5__ seconds. Repeat _5__ times. Do _3__ times a day.  Copyright  VHI. All rights reserved.  [facility] [address], [place], [place] [address] Phone # [phone] Fax [phone]

## 2016-08-07 NOTE — Therapy (Signed)
[facility] [address], [place], [place], [address] Phone: [phone]   Fax:  [phone]  Physical Therapy Treatment  Patient Details  Name: Benjamin [name] MRN: [id number] Date of Birth: 1951-02-25 Referring Provider: Dr. Alexis Frock  Encounter Date: 08/07/2016      PT End of Session - 08/07/16 1541    Visit Number 4   Date for PT Re-Evaluation 10/19/16   PT Start Time 1540  came 10 min late   PT Stop Time 1615   PT Time Calculation (min) 35 min   Activity Tolerance Patient tolerated treatment well   Behavior During Therapy [facility] for tasks assessed/performed      Past Medical History:  Diagnosis Date  . Acute prostatitis 07/03/12   s/p prostate   . Allergy   . Anxiety   . Arthritis    stenosis lower back  . Back pain   . BPH (benign prostatic hypertrophy) with urinary obstruction   . Depression   . ED (erectile dysfunction)   . Headache(784.0)    "sinus" headaches  . History of ITP    as a child per patient  . Hypercholesterolemia   . Other forms of epilepsy and recurrent seizures without mention of intractable epilepsy 09/16/2013  . Peripheral axonal neuropathy 08/06/2014  . Prostate cancer ([place]) 07/03/12   Adenocarcinoma,gleason=3+3=6,PSA=5.0,volume=20cc  . Seizures ([place])   . Unspecified hereditary and idiopathic peripheral neuropathy 07/07/2014   Foot numbness starting 2010, hands beginning in 2014- 15 .    Past Surgical History:  Procedure Laterality Date  . COLONOSCOPY    . KNEE ARTHROSCOPY  2017  . LUMBAR LAMINECTOMY/DECOMPRESSION MICRODISCECTOMY N/A 12/23/2013   Procedure: Lumbar Laminectomy Decompression, Lumbar Two, Three, Four, Five;  Surgeon: Hosie Spangle, MD;  Location: [facility];  Service: Neurosurgery;  Laterality: N/A;  . LYMPHADENECTOMY Bilateral 05/25/2016   Procedure: BILATERAL PELVIC LYMPHADENECTOMY;  Surgeon: Alexis Frock, MD;  Location: [facility];  Service: Urology;   Laterality: Bilateral;  . ROBOT ASSISTED LAPAROSCOPIC RADICAL PROSTATECTOMY N/A 05/25/2016   Procedure: XI ROBOTIC ASSISTED LAPAROSCOPIC RADICAL PROSTATECTOMY WITH INDOCYANINE GREEN DYE;  Surgeon: Alexis Frock, MD;  Location: [facility];  Service: Urology;  Laterality: N/A;  . TOE SURGERY      There were no vitals filed for this visit.      Subjective Assessment - 08/07/16 1543    Subjective I can hold the urine better when going to the bathroom. I worked 12 hours yesterday so not able to do the exercises. I am using a clamp. I move alot at work and the clamp will come off.  Night time leakage is very little but have to wake up every hour.    Pertinent History Prostate CA, surgery scheduled for 05/25/16.     Limitations Lifting;Standing;Walking   How long can you sit comfortably? soreness in pelvis   How long can you stand comfortably? no pain   How long can you walk comfortably? no pain   Diagnostic tests MRI prior to surgery.    Patient Stated Goals reduce urinary incontinence   Currently in Pain? No/denies                      Pelvic Floor Special Questions - 08/07/16 0001    Biofeedback rest 1.76uv; 3 quick contraction max 89.68 uv avg. 25.75 uv; 10 second contration  62.42 uv; 20 second  53.19 uv; rest  1.77 uv:   trapezoid program   Biofeedback sensor  type Rectal  surface electrodes   Biofeedback Activity --  assessment           [facility] Adult PT Treatment/Exercise - 08/07/16 0001      Lumbar Exercises: Quadruped   [place] 15 reps  vc to just move lumbar spine                PT Education - 08/07/16 1609    Education provided Yes   Education Details use of clamp; pelvic floor contraction   Person(s) Educated Patient   Methods Explanation;Demonstration;Verbal cues;Handout   Comprehension Returned demonstration;Verbalized understanding          PT Short Term Goals - 05/03/16 1207      PT SHORT TERM GOAL #1   Title Pt will get full  level 1-2 knee HEP for AROM, strength.    Time 1   Period Days   Status Achieved           PT Long Term Goals - 08/07/16 1542      PT LONG TERM GOAL #1   Title independent with HEP   Time 6   Period Months   Status On-going     PT LONG TERM GOAL #2   Title urinary leakage from surgery decreased >/=75%   Time 6   Period Months   Status On-going  10% better     PT LONG TERM GOAL #3   Title understand scar massage to keep mobility of tissue   Time 6   Period Months   Status Achieved     PT LONG TERM GOAL #4   Title pelvic floor strength is 4/5 to reduce urinary leakage   Time 6   Period Months   Status On-going     PT LONG TERM GOAL #5   Title reduction of wearing depends to 1 per day due to reduction in urinary incontinence   Time 4   Period Months   Status On-going               Plan - 08/07/16 1610    Clinical Impression Statement Patient reports he goes to the bathroom every hour at night and has less urinary leakage. During day leakage is 10% better. Resting tone is 1.7 uv. Patient able to perform quick flicks and full relax the pelvic floor. Patietn will fatigue  with 20 second contraction.  Patient has more leakage in standing.  During EMP felt lkie he urinated but able to contract pelvic floor and stop urine. Patient will benefit form skilled therapy to improve pelvic floor strength.    Rehab Potential Excellent   Clinical Impairments Affecting Rehab Potential prostate cancer surgery on 05/26/2016   PT Frequency 1x / week   PT Duration Other (comment)  4 months   PT Treatment/Interventions Biofeedback;Electrical Stimulation;Therapeutic activities;Therapeutic exercise;Neuromuscular re-education;Patient/family education;Passive range of motion;Manual techniques   PT Next Visit Plan sit and stand activities with abdominal work   PT Home Exercise Plan progress as needed   Consulted and Agree with Plan of Care Patient      Patient will benefit from  skilled therapeutic intervention in order to improve the following deficits and impairments:  Impaired flexibility, Decreased strength, Decreased mobility, Pain, Decreased range of motion, Postural dysfunction, Decreased activity tolerance, Increased muscle spasms  Visit Diagnosis: Muscle weakness (generalized)  Aftercare following surgery for neoplasm     Problem List Patient Active Problem List   Diagnosis Date Noted  . Peripheral axonal neuropathy 08/06/2014  . Unspecified  hereditary and idiopathic peripheral neuropathy 07/07/2014  . Lumbar stenosis with neurogenic claudication 12/23/2013  . Other forms of epilepsy and recurrent seizures without mention of intractable epilepsy 09/16/2013  . Prostate cancer [facility]) 07/03/2012    Earlie Counts, PT 08/07/16 4:19 PM    [facility] [address] [place], [place], [address] Phone: [phone]   Fax:  [phone]  Name: Timo Drust MRN: [id number] Date of Birth: [birth year]

## 2016-08-09 ENCOUNTER — Ambulatory Visit: Payer: 59 | Admitting: Adult Health

## 2016-08-15 ENCOUNTER — Ambulatory Visit (INDEPENDENT_AMBULATORY_CARE_PROVIDER_SITE_OTHER): Payer: 59 | Admitting: Adult Health

## 2016-08-15 ENCOUNTER — Encounter: Payer: Self-pay | Admitting: Physical Therapy

## 2016-08-15 ENCOUNTER — Encounter: Payer: Self-pay | Admitting: Adult Health

## 2016-08-15 ENCOUNTER — Telehealth: Payer: Self-pay | Admitting: *Deleted

## 2016-08-15 ENCOUNTER — Ambulatory Visit: Payer: 59 | Attending: Urology | Admitting: Physical Therapy

## 2016-08-15 DIAGNOSIS — R569 Unspecified convulsions: Secondary | ICD-10-CM

## 2016-08-15 DIAGNOSIS — Z483 Aftercare following surgery for neoplasm: Secondary | ICD-10-CM | POA: Diagnosis present

## 2016-08-15 DIAGNOSIS — M6281 Muscle weakness (generalized): Secondary | ICD-10-CM | POA: Diagnosis present

## 2016-08-15 MED ORDER — LEVETIRACETAM 750 MG PO TABS: 750.0000 mg | ORAL_TABLET | Freq: Two times a day (BID) | ORAL | 3 refills | Status: DC

## 2016-08-15 NOTE — Therapy (Signed)
[facility] [address] [place], [place], [address] Phone: [phone]   Fax:  [phone]  Physical Therapy Treatment  Patient Details  Name: Benjamin [name] MRN: [id number] Date of Birth: 1950/12/24 Referring Provider: Dr. Alexis Frock  Encounter Date: 08/15/2016      PT End of Session - 08/15/16 1141    Visit Number 5   Date for PT Re-Evaluation 10/19/16   PT Start Time 1102   PT Stop Time 1142   PT Time Calculation (min) 40 min   Activity Tolerance Patient tolerated treatment well   Behavior During Therapy [facility] for tasks assessed/performed      Past Medical History:  Diagnosis Date  . Acute prostatitis 07/03/12   s/p prostate   . Allergy   . Anxiety   . Arthritis    stenosis lower back  . Back pain   . BPH (benign prostatic hypertrophy) with urinary obstruction   . Depression   . ED (erectile dysfunction)   . Headache(784.0)    "sinus" headaches  . History of ITP    as a child per patient  . Hypercholesterolemia   . Other forms of epilepsy and recurrent seizures without mention of intractable epilepsy 09/16/2013  . Peripheral axonal neuropathy 08/06/2014  . Prostate cancer ([place]) 07/03/12   Adenocarcinoma,gleason=3+3=6,PSA=5.0,volume=20cc  . Seizures ([place])   . Unspecified hereditary and idiopathic peripheral neuropathy 07/07/2014   Foot numbness starting 2010, hands beginning in 2014- 15 .    Past Surgical History:  Procedure Laterality Date  . COLONOSCOPY    . KNEE ARTHROSCOPY  2017  . LUMBAR LAMINECTOMY/DECOMPRESSION MICRODISCECTOMY N/A 12/23/2013   Procedure: Lumbar Laminectomy Decompression, Lumbar Two, Three, Four, Five;  Surgeon: Hosie Spangle, MD;  [facility]: [facility];  Service: Neurosurgery;  Laterality: N/A;  . LYMPHADENECTOMY Bilateral 05/25/2016   Procedure: BILATERAL PELVIC LYMPHADENECTOMY;  Surgeon: Alexis Frock, MD;  [facility]: [facility];  Service: Urology;  Laterality: Bilateral;   . ROBOT ASSISTED LAPAROSCOPIC RADICAL PROSTATECTOMY N/A 05/25/2016   Procedure: XI ROBOTIC ASSISTED LAPAROSCOPIC RADICAL PROSTATECTOMY WITH INDOCYANINE GREEN DYE;  Surgeon: Alexis Frock, MD;  Location: [facility];  Service: Urology;  Laterality: N/A;  . TOE SURGERY      There were no vitals filed for this visit.      Subjective Assessment - 08/15/16 1107    Subjective I used the clamp that caused a sore spot.  I will use a new clamp with more padding once the sore heals.  I have been working overtime so hard to do the exercises 3 times per day.    Pertinent History Prostate CA, surgery scheduled for 05/25/16.     Limitations Lifting;Standing;Walking   How long can you sit comfortably? soreness in pelvis   How long can you stand comfortably? no pain   How long can you walk comfortably? no pain   Diagnostic tests MRI prior to surgery.    Patient Stated Goals reduce urinary incontinence   Currently in Pain? 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    Time 1   Period Days   Status Achieved           PT Long Term Goals - 08/15/16 1141      PT LONG TERM GOAL #1   Title independent with HEP    Time 6   Period Months   Status On-going     PT LONG TERM GOAL #2   Title urinary leakage from surgery decreased >/=75%   Time 6   Period Months   Status On-going  working with a clamp     PT LONG TERM GOAL #3   Title understand scar massage to keep mobility of tissue   Time 6   Period Months   Status Achieved     PT LONG TERM GOAL #4   Title pelvic floor strength is 4/5 to reduce urinary leakage   Time 6   Period Months   Status On-going     PT LONG TERM GOAL #5   Title reduction of wearing depends to 1 per day due to reduction in urinary incontinence   Time 4   Period Months   Status On-going               Plan - 08/15/16 1142    Clinical Impression Statement Patient reports a stronger urine stream.  No change in liquid in the pads.  He had to stop using clamp du eto sore on the penis.  He has  a new clamp with more cushining.  Patient had scar tissue and abdominal tightness that contributes to forward posture.  Patient will benefit from skilled therapy to reduce urinary leakage.    Rehab Potential Excellent   Clinical Impairments Affecting Rehab Potential prostate cancer surgery on 05/26/2016   PT Frequency 1x / week   PT Duration Other (comment)  4 months   PT Treatment/Interventions Biofeedback;Electrical Stimulation;Therapeutic activities;Therapeutic exercise;Neuromuscular re-education;Patient/family education;Passive range of motion;Manual techniques   PT Next Visit Plan sit and stand activities with abdominal work; see how it is going with the clamp   PT Home Exercise Plan progress as needed   Consulted and Agree with Plan of Care Patient      Patient will benefit from skilled therapeutic intervention in order to improve the following deficits and impairments:  Impaired flexibility, Decreased strength, Decreased mobility, Pain, Decreased range of motion, Postural dysfunction, Decreased activity tolerance, Increased muscle spasms  Visit  Diagnosis: Muscle weakness (generalized)  Aftercare following surgery for neoplasm     Problem List Patient Active Problem List   Diagnosis Date Noted  . Peripheral axonal neuropathy 08/06/2014  . Unspecified hereditary and idiopathic peripheral neuropathy 07/07/2014  . Lumbar stenosis with neurogenic claudication 12/23/2013  . Other forms of epilepsy and recurrent seizures without mention of intractable epilepsy 09/16/2013  . Prostate cancer [facility]) 07/03/2012    Earlie Counts, PT 08/15/16 11:45 AM   [facility] [address] [place], [place], [address] Phone: [phone]   Fax:  [phone]  Name: Benjamin [name] MRN: [id number] Date of Birth: 23-Sep-1951

## 2016-08-15 NOTE — Progress Notes (Signed)
I agree with the assessment and plan as directed by NP .The patient is known to me .   Mikaela Hilgeman, MD  

## 2016-08-15 NOTE — Patient Instructions (Signed)
Continue Keppra 750 mg twice a day Memory score is stable If your symptoms worsen or you develop new symptoms please let us know.

## 2016-08-15 NOTE — Telephone Encounter (Signed)
Tried to fax to [facility] and failed x 2.  Called and spoke to Wille Glaser, pharmacist.  He stated fax has been down since last Friday.  I gave verbal order. Levetiracetam (KEPPRA) 750mg  tabs.  Take one tablet po bid.  Quantity 180 tabs with 3 refills.   Per Ward Givens, NP.

## 2016-08-15 NOTE — Progress Notes (Signed)
PATIENT: Benjamin [name] DOB: [birth year]  REASON FOR VISIT: follow up- seizures HISTORY FROM: patient  HISTORY OF PRESENT ILLNESS: Benjamin [name] is a 65 year old male with a history of seizures. He returns today for follow-up. He is currently on Keppra 750 mg twice a day. He reports that he is tolerating this medication well. No additional seizures. 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 He operates a Teacher, music. He feels that his memory has remained stable. He states he continues to have a hard time with names but otherwise no additional challenges. He denies any new medical issues. He returns today for an evaluation.  HISTORY Benjamin [name] is a 65 year old male with a history of seizures. He returns today for an evaluation. He is currently taking Keppra 750 mg twice a day. He is tolerating this medication well. He has not had any additional seizure events. He operates a Teacher, music without difficulty. He is able to complete all ADLs independent. He works as a Dispensing optician. The patient has noticed some difficulty with his memory. He states that he tends to forget names. Occasionally he will forget what he was going to do but he is eventually able to recall it. Denies any other symptoms regarding his memory. He returns today for an evaluation.  REVIEW OF SYSTEMS: Out of a complete 14 system review of symptoms, the patient complains only of the following symptoms, and all other reviewed systems are negative.  Incontinence of bladder, memory loss                                                                                                                                        ALLERGIES: Allergies  Allergen Reactions  . Aspartame And Phenylalanine     Powder causes seizures  . Benadryl [Diphenhydramine]     seizures    HOME MEDICATIONS: Outpatient Medications Prior to Visit  Medication Sig Dispense Refill  . HYDROcodone-acetaminophen (NORCO) 5-325 MG  tablet Take 1-2 tablets by mouth every 6 (six) hours as needed for moderate pain. 30 tablet 0  . levETIRAcetam (KEPPRA) 750 MG tablet Take 1 tablet (750 mg total) by mouth every 12 (twelve) hours. 180 tablet 4  . sertraline (ZOLOFT) 100 MG tablet Take 100 mg by mouth daily as needed (when feeling down). Reported on 05/03/2016    . sulfamethoxazole-trimethoprim (BACTRIM DS,SEPTRA DS) 800-160 MG tablet Take 1 tablet by mouth 2 (two) times daily. Start the day prior to foley removal appointment (Patient not taking: Reported on 07/30/2016) 6 tablet 0   No facility-administered medications prior to visit.     PAST MEDICAL HISTORY: Past Medical History:  Diagnosis Date  . Acute prostatitis 07/03/12   s/p prostate   . Allergy   . Anxiety   . Arthritis    stenosis lower back  . Back pain   . BPH (benign prostatic  hypertrophy) with urinary obstruction   . Depression   . ED (erectile dysfunction)   . Headache(784.0)    "sinus" headaches  . History of ITP    as a child per patient  . Hypercholesterolemia   . Other forms of epilepsy and recurrent seizures without mention of intractable epilepsy 09/16/2013  . Peripheral axonal neuropathy 08/06/2014  . Prostate cancer ([place]) 07/03/12   Adenocarcinoma,gleason=3+3=6,PSA=5.0,volume=20cc  . Seizures ([place])   . Unspecified hereditary and idiopathic peripheral neuropathy 07/07/2014   Foot numbness starting 2010, hands beginning in 2014- 15 .    PAST SURGICAL HISTORY: Past Surgical History:  Procedure Laterality Date  . COLONOSCOPY    . KNEE ARTHROSCOPY  2017  . LUMBAR LAMINECTOMY/DECOMPRESSION MICRODISCECTOMY N/A 12/23/2013   Procedure: Lumbar Laminectomy Decompression, Lumbar Two, Three, Four, Five;  Surgeon: Hosie Spangle, MD;  [facility]: [facility];  Service: Neurosurgery;  Laterality: N/A;  . LYMPHADENECTOMY Bilateral 05/25/2016   Procedure: BILATERAL PELVIC LYMPHADENECTOMY;  Surgeon: Alexis Frock, MD;  Location: [facility];  Service: Urology;   Laterality: Bilateral;  . ROBOT ASSISTED LAPAROSCOPIC RADICAL PROSTATECTOMY N/A 05/25/2016   Procedure: XI ROBOTIC ASSISTED LAPAROSCOPIC RADICAL PROSTATECTOMY WITH INDOCYANINE GREEN DYE;  Surgeon: Alexis Frock, MD;  Location: [facility];  Service: Urology;  Laterality: N/A;  . TOE SURGERY      FAMILY HISTORY: Family History  Problem Relation Age of Onset  . Cancer Father 66    prostate/prostatectomy  . Cancer Cousin 53    prostate ca, also heart problems    SOCIAL HISTORY: Social History   Social History  . Marital status: Married    Spouse name: [name]  . Number of children: 1  . Years of education: Asso x 2   Occupational History  .  [employer]   Social History Main Topics  . Smoking status: Never Smoker  . Smokeless tobacco: Never Used  . Alcohol use Yes     Comment: social beer occasionally  . Drug use: No     Comment: chewed in high school short time  . Sexual activity: Yes   Other Topics Concern  . Not on file   Social History Narrative   Patient is married [name]) and lives at home with his wife and 1 child and a step-child.   Patient is working full-time.   Patient has two Associate degrees.   Patient is right-handed.   Patient drinks four cups of tea daily.      PHYSICAL EXAM  Vitals:   08/15/16 1253  BP: 108/70  Pulse: 61  Weight: 211 lb 6.4 oz (95.9 kg)  Height: 6' (1.829 m)   Body mass index is 28.67 kg/m.   MMSE - Mini Mental State Exam 08/15/2016 08/10/2015  Orientation to time 5 5  Orientation to Place 5 5  Registration 3 3  Attention/ Calculation 5 5  Recall 3 3  Language- name 2 objects 2 2  Language- repeat 1 1  Language- follow 3 step command 3 2  Language- read & follow direction 1 1  Write a sentence 1 1  Copy design 1 1  Total score 30 29     Generalized: Well developed, in no acute distress   Neurological examination  Mentation: Alert oriented to time, place, history taking. Follows all commands speech and language  fluent Cranial nerve II-XII: Pupils were equal round reactive to light. Extraocular movements were full, visual field were full on confrontational test. Facial sensation and strength were normal. Uvula tongue midline.  Head turning and shoulder shrug  were normal and symmetric. Motor: The motor testing reveals 5 over 5 strength of all 4 extremities. Good symmetric motor tone is noted throughout.  Sensory: Sensory testing is intact to soft touch on all 4 extremities. No evidence of extinction is noted.  Coordination: Cerebellar testing reveals good finger-nose-finger and heel-to-shin bilaterally.  Gait and station: Gait is normal. Tandem gait is normal. Romberg is negative. No drift is seen.  Reflexes: Deep tendon reflexes are symmetric and normal bilaterally.   DIAGNOSTIC DATA (LABS, IMAGING, TESTING) - I reviewed patient records, labs, notes, testing and imaging myself where available.  Lab Results  Component Value Date   WBC 6.2 05/23/2016   HGB 13.2 05/26/2016   HCT 38.4 (L) 05/26/2016   MCV 88.5 05/23/2016   PLT 185 05/23/2016      Component Value Date/Time   NA 136 05/26/2016 0418   K 4.3 05/26/2016 0418   CL 105 05/26/2016 0418   CO2 25 05/26/2016 0418   GLUCOSE 199 (H) 05/26/2016 0418   BUN 14 05/26/2016 0418   CREATININE 0.98 05/26/2016 0418   CALCIUM 8.1 (L) 05/26/2016 0418   PROT 6.5 07/07/2014 0943   GFRNONAA >60 05/26/2016 0418   GFRAA >60 05/26/2016 0418       ASSESSMENT AND PLAN 65 y.o. year old male  has a past medical history of Acute prostatitis (07/03/12); Allergy; Anxiety; Arthritis; Back pain; BPH (benign prostatic hypertrophy) with urinary obstruction; Depression; ED (erectile dysfunction); Headache(784.0); History of ITP; Hypercholesterolemia; Other forms of epilepsy and recurrent seizures without mention of intractable epilepsy (09/16/2013); Peripheral axonal neuropathy (08/06/2014); Prostate cancer ([place]) (07/03/12); Seizures ([place]); and Unspecified hereditary  and idiopathic peripheral neuropathy (07/07/2014). here with:  1. Seizures 2. Memory disturbance  Overall the patient is doing well. He will continue on Keppra 750 mg twice a day. His memory score has remained stable. We will continue to monitor. If patient's symptoms worsen or he develops any new symptoms he should let us know. He will follow-up in 1 year  or sooner if needed.     Ward Givens, MSN, NP-C 08/15/2016, 1:09 PM [facility] [address], [place], [place] [address] [phone]

## 2016-08-23 ENCOUNTER — Ambulatory Visit: Payer: 59 | Admitting: Physical Therapy

## 2016-08-23 ENCOUNTER — Encounter: Payer: Self-pay | Admitting: Physical Therapy

## 2016-08-23 DIAGNOSIS — M6281 Muscle weakness (generalized): Secondary | ICD-10-CM | POA: Diagnosis not present

## 2016-08-23 DIAGNOSIS — Z483 Aftercare following surgery for neoplasm: Secondary | ICD-10-CM

## 2016-08-23 NOTE — Therapy (Signed)
[facility] [address] [place], [place], [address] Phone: [phone]   Fax:  [phone]  Physical Therapy Treatment  Patient Details  Name: Benjamin [name] MRN: [id number] Date of Birth: 04-17-1951 Referring Provider: Dr. Alexis Frock  Encounter Date: 08/23/2016      PT End of Session - 08/23/16 1155    Visit Number 6   Date for PT Re-Evaluation 10/19/16   PT Start Time 1145   PT Stop Time 1225   PT Time Calculation (min) 40 min   Activity Tolerance Patient tolerated treatment well   Behavior During Therapy [facility] for tasks assessed/performed      Past Medical History:  Diagnosis Date  . Acute prostatitis 07/03/12   s/p prostate   . Allergy   . Anxiety   . Arthritis    stenosis lower back  . Back pain   . BPH (benign prostatic hypertrophy) with urinary obstruction   . Depression   . ED (erectile dysfunction)   . Headache(784.0)    "sinus" headaches  . History of ITP    as a child per patient  . Hypercholesterolemia   . Other forms of epilepsy and recurrent seizures without mention of intractable epilepsy 09/16/2013  . Peripheral axonal neuropathy 08/06/2014  . Prostate cancer ([place]) 07/03/12   Adenocarcinoma,gleason=3+3=6,PSA=5.0,volume=20cc  . Seizures ([place])   . Unspecified hereditary and idiopathic peripheral neuropathy 07/07/2014   Foot numbness starting 2010, hands beginning in 2014- 15 .    Past Surgical History:  Procedure Laterality Date  . COLONOSCOPY    . KNEE ARTHROSCOPY  2017  . LUMBAR LAMINECTOMY/DECOMPRESSION MICRODISCECTOMY N/A 12/23/2013   Procedure: Lumbar Laminectomy Decompression, Lumbar Two, Three, Four, Five;  Surgeon: Hosie Spangle, MD;  [facility]: [facility];  Service: Neurosurgery;  Laterality: N/A;  . LYMPHADENECTOMY Bilateral 05/25/2016   Procedure: BILATERAL PELVIC LYMPHADENECTOMY;  Surgeon: Alexis Frock, MD;  Location: [facility];  Service: Urology;  Laterality: Bilateral;   . ROBOT ASSISTED LAPAROSCOPIC RADICAL PROSTATECTOMY N/A 05/25/2016   Procedure: XI ROBOTIC ASSISTED LAPAROSCOPIC RADICAL PROSTATECTOMY WITH INDOCYANINE GREEN DYE;  Surgeon: Alexis Frock, MD;  [facility];  Service: Urology;  Laterality: N/A;  . TOE SURGERY      There were no vitals filed for this visit.      Subjective Assessment - 08/23/16 1149    Subjective I saw Dr. Tammi Klippel said progress is slow.  Patient was able to hold leakage while coughing. I have been doing my exercises.    Pertinent History Prostate CA, surgery scheduled for 05/25/16.     Limitations Lifting;Standing;Walking   How long can you sit comfortably? soreness in pelvis   How long can you stand comfortably? no pain   How long can you walk comfortably? no pain   Diagnostic tests MRI prior to surgery.    Patient Stated Goals reduce urinary incontinence   Currently in Pain? No/denies   Multiple Pain Sites No                      Pelvic Floor Special Questions - 08/23/16 0001    Pad use using a clamp   Urinary frequency sleep on back longer than sleeping on side           [facility] Adult PT Treatment/Exercise - 08/23/16 0001      Knee/Hip Exercises: Supine   Bridges with Diona Foley Squeeze Both;Strengthening;20 reps  PT Education - 08/23/16 1219    Education provided Yes   Education Details hip strength with pelvic floor exercises   Person(s) Educated Patient   Methods Explanation;Demonstration;Verbal cues;Handout   Comprehension Returned demonstration;Verbalized understanding          PT Short Term Goals - 05/03/16 1207      PT SHORT TERM GOAL #1   Title Pt will get full level 1-2 knee HEP for AROM, strength.    Time 1   Period Days   Status Achieved           PT Long Term Goals - 08/23/16 1222      PT LONG TERM GOAL #1   Title independent with HEP   Time 6   Period Months   Status On-going  continues to learn exercises     PT LONG TERM GOAL #2    Title urinary leakage from surgery decreased >/=75%   Time 6   Period Months   Status On-going  able to use clamp, hold urine with cough     PT LONG TERM GOAL #3   Title understand scar massage to keep mobility of tissue   Time 6   Period Months   Status Achieved     PT LONG TERM GOAL #4   Title pelvic floor strength is 4/5 to reduce urinary leakage   Time 6   Period Months   Status On-going     PT LONG TERM GOAL #5   Title reduction of wearing depends to 1 per day due to reduction in urinary incontinence   Time 4   Period Months   Status On-going  uses a clamp               Plan - 08/23/16 1223    Clinical Impression Statement Patient is now using the penile clamp correctly.  Patient is able to hold his urine with coughing at the doctors office.  Patient is more consistent with HEP.  Patient is now holding pelvic floor contraction for 10 seconds.  Patient will benefit form skilled therapy to reduce urinary leakage.    Rehab Potential Excellent   Clinical Impairments Affecting Rehab Potential prostate cancer surgery on 05/26/2016   PT Frequency 1x / week   PT Duration Other (comment)  4 months   PT Treatment/Interventions Biofeedback;Electrical Stimulation;Therapeutic activities;Therapeutic exercise;Neuromuscular re-education;Patient/family education;Passive range of motion;Manual techniques   PT Next Visit Plan progress abdominal exercises, pelvic floor contraction with coughing   PT Home Exercise Plan progress as needed   Consulted and Agree with Plan of Care Patient      Patient will benefit from skilled therapeutic intervention in order to improve the following deficits and impairments:  Impaired flexibility, Decreased strength, Decreased mobility, Pain, Decreased range of motion, Postural dysfunction, Decreased activity tolerance, Increased muscle spasms  Visit Diagnosis: Muscle weakness (generalized)  Aftercare following surgery for neoplasm     Problem  List Patient Active Problem List   Diagnosis Date Noted  . Peripheral axonal neuropathy 08/06/2014  . Unspecified hereditary and idiopathic peripheral neuropathy 07/07/2014  . Lumbar stenosis with neurogenic claudication 12/23/2013  . Other forms of epilepsy and recurrent seizures without mention of intractable epilepsy 09/16/2013  . Prostate cancer [facility]) 07/03/2012    Earlie Counts, PT 08/23/16 12:26 PM   [facility] [address], [place], [place], [address] Phone: [phone]   Fax:  [phone]  Name: Lennin Estella MRN: [id number] Date of Birth: 1951/04/21

## 2016-08-23 NOTE — Patient Instructions (Addendum)
Keep clamp on during the day. Go to the bathroom every 2 hours. When going to the bathroom unclamp penis to urinate.  When done put clamp back on.  Do not wear clamp at night.   Quick Contraction: Gravity Resisted (Standing)    Standing, quickly squeeze then fully relax pelvic floor. Perform __1_ sets of _5__. Rest for 1___ seconds between sets. Do __3_ times a day.  Copyright  VHI. All rights reserved.  Slow Contraction: Gravity Resisted (Standing)    Standing, slowly squeeze pelvic floor for __15_ seconds. Rest for _5__ seconds. Repeat _5__ times. Do _3__ times a day.  Strengthening: Hip Abduction - Resisted    With tubing around right leg, other side toward anchor, extend leg out from side.Tighten pelvic floor.  Repeat __15__ times per set. Do __2__ sets per session. Do _1___ sessions per day. Blue band. Do on each leg http://orth.exer.us/634   Copyright  VHI. All rights reserved.  Strengthening: Hip Extension - Resisted    With tubing around right ankle, face anchor and pull leg straight back. Tighten pelvic floor. Repeat __15__ times per set. Do __2__ sets per session. Do __1__ sessions per day. Use blue band. http://orth.exer.us/636   Copyright  VHI. All rights reserved.  Mini Squat: With [employer]    Stand against wall. Stand with ball between knees. Contract pelvic floor. Squat with head up, reaching back with buttocks as if sitting down. Hold 10 seconds. Do not do where your knees hurt.  Repeat __5__ times per set. Do __1__ sets per session. Do ___1_ sessions per day.  http://orth.exer.us/730   Copyright  VHI. All rights reserved.  [place] [address] [place], [place] [address] Phone # [phone] Fax [phone] Bracing With March in Bridging (Hook-Lying)    With neutral spine, tighten pelvic floor and abdominals and hold. Lift bottom and hold, then march in place. March _15__ times. Do __1_ times a  day.  Then do a bridge with ball squeeze 10 times 1 time per day Copyright  VHI. All rights reserved.

## 2016-09-10 ENCOUNTER — Ambulatory Visit: Payer: 59 | Admitting: Physical Therapy

## 2016-09-10 ENCOUNTER — Encounter: Payer: Self-pay | Admitting: Physical Therapy

## 2016-09-10 DIAGNOSIS — M6281 Muscle weakness (generalized): Secondary | ICD-10-CM

## 2016-09-10 DIAGNOSIS — Z483 Aftercare following surgery for neoplasm: Secondary | ICD-10-CM

## 2016-09-10 NOTE — Therapy (Signed)
[facility] [address] [place], [place], [address] Phone: [phone]   Fax:  [phone]  Physical Therapy Treatment  Patient Details  Name: Benjamin [name] MRN: [id number] Date of Birth: 1950/11/28 Referring Provider: Dr. Sebastian Ache  Encounter Date: 09/10/2016      PT End of Session - 09/10/16 1141    Visit Number 7   Date for PT Re-Evaluation 10/19/16   PT Start Time 1100   PT Stop Time 1141   PT Time Calculation (min) 41 min   Activity Tolerance Patient tolerated treatment well;No increased pain   Behavior During Therapy WFL for tasks assessed/performed      Past Medical History:  Diagnosis Date  . Acute prostatitis 07/03/12   s/p prostate   . Allergy   . Anxiety   . Arthritis    stenosis lower back  . Back pain   . BPH (benign prostatic hypertrophy) with urinary obstruction   . Depression   . ED (erectile dysfunction)   . Headache(784.0)    "sinus" headaches  . History of ITP    as a child per patient  . Hypercholesterolemia   . Other forms of epilepsy and recurrent seizures without mention of intractable epilepsy 09/16/2013  . Peripheral axonal neuropathy 08/06/2014  . Prostate cancer ([facility]) 07/03/12   Adenocarcinoma,gleason=3+3=6,PSA=5.0,volume=20cc  . Seizures ([facility])   . Unspecified hereditary and idiopathic peripheral neuropathy 07/07/2014   Foot numbness starting 2010, hands beginning in 2014- 15 .    Past Surgical History:  Procedure Laterality Date  . COLONOSCOPY    . KNEE ARTHROSCOPY  2017  . LUMBAR LAMINECTOMY/DECOMPRESSION MICRODISCECTOMY N/A 12/23/2013   Procedure: Lumbar Laminectomy Decompression, Lumbar Two, Three, Four, Five;  Surgeon: Hewitt Shorts, MD;  [facility]: [facility];  Service: Neurosurgery;  Laterality: N/A;  . LYMPHADENECTOMY Bilateral 05/25/2016   Procedure: BILATERAL PELVIC LYMPHADENECTOMY;  Surgeon: Sebastian Ache, MD;  [facility]: [facility];  Service: Urology;   Laterality: Bilateral;  . ROBOT ASSISTED LAPAROSCOPIC RADICAL PROSTATECTOMY N/A 05/25/2016   Procedure: XI ROBOTIC ASSISTED LAPAROSCOPIC RADICAL PROSTATECTOMY WITH INDOCYANINE GREEN DYE;  Surgeon: Sebastian Ache, MD;  Location: [facility];  Service: Urology;  Laterality: N/A;  . TOE SURGERY      There were no vitals filed for this visit.      Subjective Assessment - 09/10/16 1107    Subjective I saw my primary care doctor and found out I was passing blood. I am trying to see Dr. Kathrynn Running but trouble with work schedule. Last week I had some pain but now it is better. I have not used the clamp due to blood in urine. I am changing the pads very ofted.    Pertinent History Prostate CA, surgery scheduled for 05/25/16.     Limitations Lifting;Standing;Walking   How long can you sit comfortably? soreness in pelvis   How long can you stand comfortably? no pain   How long can you walk comfortably? no pain   Diagnostic tests MRI prior to surgery.    Patient Stated Goals reduce urinary incontinence   Currently in Pain? No/denies                         [facility] Adult PT Treatment/Exercise - 09/10/16 0001      Manual Therapy   Manual Therapy Soft tissue mobilization;Myofascial release   Soft tissue mobilization soft tissue work suprapubic area to release the bladder and increase trunk extension   Myofascial Release  respiratory diaphragm; lower pelvic floor dipahgram mobilization                PT Education - 09/10/16 1140    Education provided Yes   Education Details bridge with pelvic floor contraction; modified wall squat with pelvic floor contraction; reveiwed other exercises   Person(s) Educated Patient   Methods Explanation;Verbal cues;Handout;Demonstration   Comprehension Verbalized understanding;Returned demonstration          PT Short Term Goals - 05/03/16 1207      PT SHORT TERM GOAL #1   Title Pt will get full level 1-2 knee HEP for AROM, strength.    Time 1    Period Days   Status Achieved           PT Long Term Goals - 09/10/16 1110      PT LONG TERM GOAL #1   Title independent with HEP   Time 6   Period Months   Status On-going     PT LONG TERM GOAL #2   Title urinary leakage from surgery decreased >/=75%   Time 6   Period Months   Status On-going     PT LONG TERM GOAL #3   Title understand scar massage to keep mobility of tissue   Time 6   Period Months   Status Achieved     PT LONG TERM GOAL #4   Title pelvic floor strength is 4/5 to reduce urinary leakage   Time 6   Period Months   Status On-going     PT LONG TERM GOAL #5   Title reduction of wearing depends to 1 per day due to reduction in urinary incontinence   Time 4   Period Months   Status On-going               Plan - 09/10/16 1142    Clinical Impression Statement Patient had bleeding in his urine so he stopped his exercises for several days.  Patient is going today to have his urine checked making sure he stopped bleeding.  Patient had tightness in the lower abdomen which was released with manual skills.  Patient HEP was reviewed to scale downt the difficulty due to the bleeding.  Patient had no pain with exercises and manual work.  Patient has not met goals due to set back.  Patient is not using the clamp due to the bleeding.  Patient reports his is leaking more urine lately.  Patient will benefit from skilled therapy to reduce urinary leakage.    Rehab Potential Excellent   Clinical Impairments Affecting Rehab Potential prostate cancer surgery on 05/26/2016   PT Frequency 1x / week   PT Duration Other (comment)  4 months   PT Treatment/Interventions Biofeedback;Electrical Stimulation;Therapeutic activities;Therapeutic exercise;Neuromuscular re-education;Patient/family education;Passive range of motion;Manual techniques   PT Next Visit Plan progress abdominal exercises, pelvic floor contraction with coughing   PT Home Exercise Plan progress as needed    Consulted and Agree with Plan of Care Patient      Patient will benefit from skilled therapeutic intervention in order to improve the following deficits and impairments:  Impaired flexibility, Decreased strength, Decreased mobility, Pain, Decreased range of motion, Postural dysfunction, Decreased activity tolerance, Increased muscle spasms  Visit Diagnosis: Muscle weakness (generalized)  Aftercare following surgery for neoplasm     Problem List Patient Active Problem List   Diagnosis Date Noted  . Peripheral axonal neuropathy 08/06/2014  . Unspecified hereditary and idiopathic peripheral neuropathy 07/07/2014  . Lumbar  stenosis with neurogenic claudication 12/23/2013  . Other forms of epilepsy and recurrent seizures without mention of intractable epilepsy 09/16/2013  . Prostate cancer [facility]) 07/03/2012    Earlie Counts, PT 09/10/16 11:46 AM    [facility] [address], [place], [place], [address] Phone: [phone]   Fax:  [phone]  Name: Dreydon Cardenas MRN: [id number] Date of Birth: 06-27-1951

## 2016-09-10 NOTE — Patient Instructions (Addendum)
Bracing With Bridging (Hook-Lying)    With neutral spine, tighten pelvic floor and abdominals and hold. Lift bottom. Repeat 10___ times. Do _1__ times a day.   Copyright  VHI. All rights reserved.  Back Wall Slide    With feet _12___ inches from wall, lean as much of back against the wall as possible. Gently squat down __4_ inches, keeping back against wall. Hold _5___ seconds while counting out loud. Repeat __5__ times. Do _1_ sessions per day. Hold up pelvic floor with exercise. Make sure you are breathing.  http://gt2.exer.us/563   Copyright  VHI. All rights reserved.   [facility] [address], [place], [place] [address] Phone # [phone] Fax [phone]

## 2016-09-26 ENCOUNTER — Encounter: Payer: Self-pay | Admitting: Physical Therapy

## 2016-09-26 ENCOUNTER — Ambulatory Visit: Payer: 59 | Attending: Urology | Admitting: Physical Therapy

## 2016-09-26 DIAGNOSIS — M6281 Muscle weakness (generalized): Secondary | ICD-10-CM | POA: Diagnosis not present

## 2016-09-26 DIAGNOSIS — Z483 Aftercare following surgery for neoplasm: Secondary | ICD-10-CM | POA: Diagnosis present

## 2016-09-26 NOTE — Patient Instructions (Addendum)
   Lying on your stomach, place a towel under the thigh and place a strap around the ankle. Keeping the knees together, slowly pull the foot towards the buttocks until you feel a gentle stretch in the front of the thigh. Hold 30 sec. 2 times each leg 1 time per day.   [facility] [address] [place], [place] [address] Phone # [phone] Fax [phone]

## 2016-09-26 NOTE — Therapy (Signed)
[facility] [address], [place], [place], [address] Phone: [phone]   Fax:  [phone]  Physical Therapy Treatment  Patient Details  Name: Benjamin [name] MRN: [id number] Date of Birth: May 04, 1951 Referring Provider: Dr. Alexis Frock  Encounter Date: 09/26/2016      PT End of Session - 09/26/16 1141    Visit Number 8   Date for PT Re-Evaluation 02/17/17   PT Start Time 1100   PT Stop Time 1145   PT Time Calculation (min) 45 min   Activity Tolerance Patient tolerated treatment well;No increased pain   Behavior During Therapy WFL for tasks assessed/performed      Past Medical History:  Diagnosis Date  . Acute prostatitis 07/03/12   s/p prostate   . Allergy   . Anxiety   . Arthritis    stenosis lower back  . Back pain   . BPH (benign prostatic hypertrophy) with urinary obstruction   . Depression   . ED (erectile dysfunction)   . Headache(784.0)    "sinus" headaches  . History of ITP    as a child per patient  . Hypercholesterolemia   . Other forms of epilepsy and recurrent seizures without mention of intractable epilepsy 09/16/2013  . Peripheral axonal neuropathy 08/06/2014  . Prostate cancer ([place]) 07/03/12   Adenocarcinoma,gleason=3+3=6,PSA=5.0,volume=20cc  . Seizures ([place])   . Unspecified hereditary and idiopathic peripheral neuropathy 07/07/2014   Foot numbness starting 2010, hands beginning in 2014- 15 .    Past Surgical History:  Procedure Laterality Date  . COLONOSCOPY    . KNEE ARTHROSCOPY  2017  . LUMBAR LAMINECTOMY/DECOMPRESSION MICRODISCECTOMY N/A 12/23/2013   Procedure: Lumbar Laminectomy Decompression, Lumbar Two, Three, Four, Five;  Surgeon: Hosie Spangle, MD;  Location: [facility];  Service: Neurosurgery;  Laterality: N/A;  . LYMPHADENECTOMY Bilateral 05/25/2016   Procedure: BILATERAL PELVIC LYMPHADENECTOMY;  Surgeon: Alexis Frock, MD;  Location: [facility];  Service: Urology;   Laterality: Bilateral;  . ROBOT ASSISTED LAPAROSCOPIC RADICAL PROSTATECTOMY N/A 05/25/2016   Procedure: XI ROBOTIC ASSISTED LAPAROSCOPIC RADICAL PROSTATECTOMY WITH INDOCYANINE GREEN DYE;  Surgeon: Alexis Frock, MD;  Location: [facility];  Service: Urology;  Laterality: N/A;  . TOE SURGERY      There were no vitals filed for this visit.      Subjective Assessment - 09/26/16 1104    Subjective I see the doctor today at 1 PM. Patient reports he is not seeing blood in his urine.  I have been working overtime and very worn out. I still have alot of urinary leakage.  Since the blood came out I have had increased leakage.    Pertinent History Prostate CA, surgery scheduled for 05/25/16.     Limitations Lifting;Standing;Walking   How long can you sit comfortably? soreness in pelvis   How long can you stand comfortably? no pain   How long can you walk comfortably? no pain   Diagnostic tests MRI prior to surgery.    Patient Stated Goals reduce urinary incontinence   Currently in Pain? No/denies            [facility] PT Assessment - 09/26/16 0001      Assessment   Medical Diagnosis prostate cancer   Referring Provider Dr. Alexis Frock   Onset Date/Surgical Date 05/25/16   Prior Therapy no     Precautions   Precautions Other (comment)   Precaution Comments cancer precautions     Restrictions   Weight Bearing Restrictions No  Balance Screen   Has the patient fallen in the past 6 months No   Has the patient had a decrease in activity level because of a fear of falling?  No   Is the patient reluctant to leave their home because of a fear of falling?  No     Home Ecologist residence     Prior Function   Level of Independence Independent   Vocation Full time employment   Vocation Requirements mailman     Cognition   Overall Cognitive Status Within Functional Limits for tasks assessed     Posture/Postural Control   Posture/Postural Control Postural  limitations   Postural Limitations Rounded Shoulders;Forward head;Posterior pelvic tilt     AROM   Overall AROM  --  left hip ext. 0 degrees; right -5 degrees   Lumbar Flexion full   Lumbar Extension decreased by 100%   Lumbar - Right Side Bend decreased by 25%   Lumbar - Left Side Bend decreased by 25%     Strength   Overall Strength Comments bil. hip abduction 4-/5; hip extension 3-/5                  Pelvic Floor Special Questions - 09/26/16 0001    Pad use not using clamp right now and not sure when he can use it again   Biofeedback rest 4.21 uv; 3 quick contraction max 102.52 uv avg. 20.25 uv; 10 sec. 66.07 uv; 20 sec. 54.96 uv; rest after exercise is 2.21 uv; sitting resting tone is 5-6 uv;   sidely   Biofeedback sensor type Rectal  surface electrodes                   PT Education - 09/26/16 1140    Education provided Yes   Education Details hip flexor stretch in prone; contract pelvic floor in erect sitting   Person(s) Educated Patient   Methods Explanation;Demonstration;Handout   Comprehension Verbalized understanding;Returned demonstration          PT Short Term Goals - 05/03/16 1207      PT SHORT TERM GOAL #1   Title Pt will get full level 1-2 knee HEP for AROM, strength.    Time 1   Period Days   Status Achieved           PT Long Term Goals - 09/10/16 1110      PT LONG TERM GOAL #1   Title independent with HEP   Time 6   Period Months   Status On-going     PT LONG TERM GOAL #2   Title urinary leakage from surgery decreased >/=75%   Time 6   Period Months   Status On-going     PT LONG TERM GOAL #3   Title understand scar massage to keep mobility of tissue   Time 6   Period Months   Status Achieved     PT LONG TERM GOAL #4   Title pelvic floor strength is 4/5 to reduce urinary leakage   Time 6   Period Months   Status On-going     PT LONG TERM GOAL #5   Title reduction of wearing depends to 1 per day due to  reduction in urinary incontinence   Time 4   Period Months   Status On-going               Plan - 09/26/16 1141    Clinical Impression Statement Patient reports  he is not seeing any blood in his urine.  Patient reports no significant change in urinary leakage.  Patient pelvic floor resting tone in sidely 4.21uv but after exercise able to relax to 2 uv.  Patient is able to contract to 66.07 uv for 10 seconds and has good control when sitting upright.  Patient will have trouble contracting the pelvic floor with slouched sitting.  Patient reports he slouches more towards end of day due to increased back pain.  Patient has decreased bilateral hip extension due to hip flexor tightness and poor posture.  Bilateral hip extension and abduction are weak.  Patient has not been using the clamp due to blood in the urine the past few weeks.  Patient will benefit from skilled therapy to reduce urinary leakage and pelvic floor contraol.    Rehab Potential Excellent   Clinical Impairments Affecting Rehab Potential prostate cancer surgery on 05/26/2016   PT Frequency 1x / week   PT Duration Other (comment)  4 months   PT Treatment/Interventions Biofeedback;Electrical Stimulation;Therapeutic activities;Therapeutic exercise;Neuromuscular re-education;Patient/family education;Passive range of motion;Manual techniques   PT Next Visit Plan progress abdominal exercises, pelvic floor contraction with coughing; see what doctore says   PT Home Exercise Plan progress as needed   Consulted and Agree with Plan of Care Patient      Patient will benefit from skilled therapeutic intervention in order to improve the following deficits and impairments:  Impaired flexibility, Decreased strength, Decreased mobility, Pain, Decreased range of motion, Postural dysfunction, Decreased activity tolerance, Increased muscle spasms  Visit Diagnosis: Muscle weakness (generalized) - Plan: PT plan of care cert/re-cert  Aftercare  following surgery for neoplasm - Plan: PT plan of care cert/re-cert     Problem List Patient Active Problem List   Diagnosis Date Noted  . Peripheral axonal neuropathy 08/06/2014  . Unspecified hereditary and idiopathic peripheral neuropathy 07/07/2014  . Lumbar stenosis with neurogenic claudication 12/23/2013  . Other forms of epilepsy and recurrent seizures without mention of intractable epilepsy 09/16/2013  . Prostate cancer ([place]) 07/03/2012    Earlie Counts, PT 09/26/16 1:14 PM    [facility] [address], [place], [place], [address] Phone: [phone]   Fax:  [phone]  Name: Benjamin [name] MRN: [id number] Date of Birth: 04-11-1951

## 2016-10-02 ENCOUNTER — Encounter: Payer: Self-pay | Admitting: Physical Therapy

## 2016-10-02 ENCOUNTER — Ambulatory Visit: Payer: 59 | Admitting: Physical Therapy

## 2016-10-02 DIAGNOSIS — Z483 Aftercare following surgery for neoplasm: Secondary | ICD-10-CM

## 2016-10-02 DIAGNOSIS — M6281 Muscle weakness (generalized): Secondary | ICD-10-CM | POA: Diagnosis not present

## 2016-10-02 NOTE — Therapy (Signed)
[facility] [address] [place], [place], [address] Phone: [phone]   Fax:  [phone]  Physical Therapy Treatment  Patient Details  Name: Benjamin [name] MRN: [id number] Date of Birth: 15-Dec-1950 Referring Provider: Dr. Alexis Frock  Encounter Date: 10/02/2016      PT End of Session - 10/02/16 1125    Visit Number 9   Date for PT Re-Evaluation 02/17/17   PT Start Time 1100   PT Stop Time 1140   PT Time Calculation (min) 40 min   Activity Tolerance Patient tolerated treatment well;No increased pain   Behavior During Therapy WFL for tasks assessed/performed      Past Medical History:  Diagnosis Date  . Acute prostatitis 07/03/12   s/p prostate   . Allergy   . Anxiety   . Arthritis    stenosis lower back  . Back pain   . BPH (benign prostatic hypertrophy) with urinary obstruction   . Depression   . ED (erectile dysfunction)   . Headache(784.0)    "sinus" headaches  . History of ITP    as a child per patient  . Hypercholesterolemia   . Other forms of epilepsy and recurrent seizures without mention of intractable epilepsy 09/16/2013  . Peripheral axonal neuropathy 08/06/2014  . Prostate cancer ([place]) 07/03/12   Adenocarcinoma,gleason=3+3=6,PSA=5.0,volume=20cc  . Seizures ([place])   . Unspecified hereditary and idiopathic peripheral neuropathy 07/07/2014   Foot numbness starting 2010, hands beginning in 2014- 15 .    Past Surgical History:  Procedure Laterality Date  . COLONOSCOPY    . KNEE ARTHROSCOPY  2017  . LUMBAR LAMINECTOMY/DECOMPRESSION MICRODISCECTOMY N/A 12/23/2013   Procedure: Lumbar Laminectomy Decompression, Lumbar Two, Three, Four, Five;  Surgeon: Hosie Spangle, MD;  Location: [facility];  Service: Neurosurgery;  Laterality: N/A;  . LYMPHADENECTOMY Bilateral 05/25/2016   Procedure: BILATERAL PELVIC LYMPHADENECTOMY;  Surgeon: Alexis Frock, MD;  Location: [facility];  Service: Urology;   Laterality: Bilateral;  . ROBOT ASSISTED LAPAROSCOPIC RADICAL PROSTATECTOMY N/A 05/25/2016   Procedure: XI ROBOTIC ASSISTED LAPAROSCOPIC RADICAL PROSTATECTOMY WITH INDOCYANINE GREEN DYE;  Surgeon: Alexis Frock, MD;  Location: [facility];  Service: Urology;  Laterality: N/A;  . TOE SURGERY      There were no vitals filed for this visit.      Subjective Assessment - 10/02/16 1105    Subjective MD does not want me to use the clamp.  Will have a scan to make sure no trauma from clamp. I have been doing my exercises. I can hold urine better in sitting than standing. I only get up 2 times at night.    Pertinent History Prostate CA, surgery scheduled for 05/25/16.     Limitations Lifting;Standing;Walking   How long can you sit comfortably? soreness in pelvis   How long can you stand comfortably? no pain   How long can you walk comfortably? no pain   Diagnostic tests MRI prior to surgery.    Patient Stated Goals reduce urinary incontinence   Currently in Pain? No/denies                         [facility] Adult PT Treatment/Exercise - 10/02/16 0001      Lumbar Exercises: Supine   Ab Set 10 reps;3 seconds  tactile cues to contract correctly     Knee/Hip Exercises: Standing   Hip ADduction Strengthening;Right;Left;10 reps  red theraband   Hip Extension Stengthening;Right;10 reps  red theraband  Wall Squat 1 set;10 reps   Wall Squat Limitations with ball between legs     Manual Therapy   Manual Therapy Passive ROM   Passive ROM manually stretched bil. hamstring, hip adductors, quads, piriformis                PT Education - 10/02/16 1125    Education provided Yes   Education Details pelvic floor contraction in sitting, standing and supine   Person(s) Educated Patient   Methods Explanation;Demonstration;Verbal cues;Tactile cues;Handout   Comprehension Returned demonstration;Verbalized understanding          PT Short Term Goals - 05/03/16 1207      PT SHORT  TERM GOAL #1   Title Pt will get full level 1-2 knee HEP for AROM, strength.    Time 1   Period Days   Status Achieved           PT Long Term Goals - 10/02/16 1127      PT LONG TERM GOAL #1   Title independent with HEP   Time 6   Period Months   Status On-going     PT LONG TERM GOAL #2   Title urinary leakage from surgery decreased >/=75%   Time 6   Period Months   Status On-going     PT LONG TERM GOAL #3   Title understand scar massage to keep mobility of tissue   Time 6   Period Months   Status Achieved     PT LONG TERM GOAL #4   Title pelvic floor strength is 4/5 to reduce urinary leakage   Time 6   Period Months   Status On-going     PT LONG TERM GOAL #5   Title reduction of wearing depends to 1 per day due to reduction in urinary incontinence   Time 4   Period Months   Status On-going               Plan - 10/02/16 1142    Clinical Impression Statement Patient is now able to exercise.  He is having a scan tomorrow to see if damage from clamp.  No more blood in urine.  Urinary leakage decreased by 5% since he had s set back.  Patient needs verbal and tactile cues to correctly contract abdominals in supine, sitting, and standing.  Patient only wakes up 2 times a night now. Patient will benefit from skilled therapy to reduce urinary leakage and pelvic floor contraction.     Rehab Potential Excellent   Clinical Impairments Affecting Rehab Potential prostate cancer surgery on 05/26/2016   PT Frequency 1x / week   PT Duration Other (comment)  4 months   PT Treatment/Interventions Biofeedback;Electrical Stimulation;Therapeutic activities;Therapeutic exercise;Neuromuscular re-education;Patient/family education;Passive range of motion;Manual techniques   PT Next Visit Plan progress abdominal exercises, pelvic floor contraction with coughing;    PT Home Exercise Plan progress as needed   Consulted and Agree with Plan of Care Patient      Patient will benefit  from skilled therapeutic intervention in order to improve the following deficits and impairments:  Impaired flexibility, Decreased strength, Decreased mobility, Pain, Decreased range of motion, Postural dysfunction, Decreased activity tolerance, Increased muscle spasms  Visit Diagnosis: Muscle weakness (generalized)  Aftercare following surgery for neoplasm     Problem List Patient Active Problem List   Diagnosis Date Noted  . Peripheral axonal neuropathy 08/06/2014  . Unspecified hereditary and idiopathic peripheral neuropathy 07/07/2014  . Lumbar stenosis with neurogenic claudication  12/23/2013  . Other forms of epilepsy and recurrent seizures without mention of intractable epilepsy 09/16/2013  . Prostate cancer [facility]) 07/03/2012    Benjamin [name], PT 10/02/16 11:46 AM   [facility] [address] [place], [place], [address] Phone: [phone]   Fax:  [phone]  Name: Krishang Lohrmann MRN: [id number] Date of Birth: Jun 19, 1951

## 2016-10-02 NOTE — Patient Instructions (Addendum)
Abdominal Bracing With Pelvic Floor (Hook-Lying)    With neutral spine, tighten pelvic floor and abdominals. Do not bulge stomach out.  Blow out while contracting stomach. Repeat _10__ times. Do __1_ times a day. Keep breathing.  5 times in sitting, then 5 standing.  Relax shoulders in sitting. Copyright  VHI. All rights reserved.   [facility] [address] [place], [place] [address] Phone # [phone] Fax [phone]

## 2016-10-22 ENCOUNTER — Ambulatory Visit: Payer: 59 | Attending: Urology | Admitting: Physical Therapy

## 2016-10-22 ENCOUNTER — Encounter: Payer: Self-pay | Admitting: Physical Therapy

## 2016-10-22 DIAGNOSIS — M6281 Muscle weakness (generalized): Secondary | ICD-10-CM | POA: Diagnosis not present

## 2016-10-22 DIAGNOSIS — Z483 Aftercare following surgery for neoplasm: Secondary | ICD-10-CM | POA: Insufficient documentation

## 2016-10-22 NOTE — Therapy (Signed)
[facility] [address] [place], [place], [address] Phone: [phone]   Fax:  [phone]  Physical Therapy Treatment  Patient Details  Name: Benjamin [name] MRN: [id number] Date of Birth: 1951/03/19 Referring Provider: Dr. Alexis Frock  Encounter Date: 10/22/2016      PT End of Session - 10/22/16 1141    Visit Number 10   Date for PT Re-Evaluation 02/17/17   PT Start Time 1105   PT Stop Time 1143   PT Time Calculation (min) 38 min   Activity Tolerance Patient tolerated treatment well;No increased pain   Behavior During Therapy WFL for tasks assessed/performed      Past Medical History:  Diagnosis Date  . Acute prostatitis 07/03/12   s/p prostate   . Allergy   . Anxiety   . Arthritis    stenosis lower back  . Back pain   . BPH (benign prostatic hypertrophy) with urinary obstruction   . Depression   . ED (erectile dysfunction)   . Headache(784.0)    "sinus" headaches  . History of ITP    as a child per patient  . Hypercholesterolemia   . Other forms of epilepsy and recurrent seizures without mention of intractable epilepsy 09/16/2013  . Peripheral axonal neuropathy 08/06/2014  . Prostate cancer ([place]) 07/03/12   Adenocarcinoma,gleason=3+3=6,PSA=5.0,volume=20cc  . Seizures ([place])   . Unspecified hereditary and idiopathic peripheral neuropathy 07/07/2014   Foot numbness starting 2010, hands beginning in 2014- 15 .    Past Surgical History:  Procedure Laterality Date  . COLONOSCOPY    . KNEE ARTHROSCOPY  2017  . LUMBAR LAMINECTOMY/DECOMPRESSION MICRODISCECTOMY N/A 12/23/2013   Procedure: Lumbar Laminectomy Decompression, Lumbar Two, Three, Four, Five;  Surgeon: Hosie Spangle, MD;  [facility]: [facility];  Service: Neurosurgery;  Laterality: N/A;  . LYMPHADENECTOMY Bilateral 05/25/2016   Procedure: BILATERAL PELVIC LYMPHADENECTOMY;  Surgeon: Alexis Frock, MD;  [facility]: [facility];  Service: Urology;   Laterality: Bilateral;  . ROBOT ASSISTED LAPAROSCOPIC RADICAL PROSTATECTOMY N/A 05/25/2016   Procedure: XI ROBOTIC ASSISTED LAPAROSCOPIC RADICAL PROSTATECTOMY WITH INDOCYANINE GREEN DYE;  Surgeon: Alexis Frock, MD;  [facility];  Service: Urology;  Laterality: N/A;  . TOE SURGERY      There were no vitals filed for this visit.      Subjective Assessment - 10/22/16 1106    Subjective I have not been able to do my exercises due to working so much overtime.    Pertinent History Prostate CA, surgery scheduled for 05/25/16.     Limitations Lifting;Standing;Walking   How long can you sit comfortably? soreness in pelvis   How long can you stand comfortably? no pain   How long can you walk comfortably? no pain   Diagnostic tests MRI prior to surgery.    Patient Stated Goals reduce urinary incontinence   Currently in Pain? No/denies                         [facility] Adult PT Treatment/Exercise - 10/22/16 0001      Self-Care   Self-Care Other Self-Care Comments   Other Self-Care Comments  discussed with patient on how to incorporate pelvic floor exercises in his day due to his busy schedule at work     Exercises   Exercises Other Exercises   Other Exercises  hold pelvic floor while blowing 5x; holding pelvic floor with getting in/out of bed     Lumbar Exercises: Supine  Ab Set 10 reps  tactile cues to contract correctly; hold 10 seconds   AB Set Limitations vc to not flatten spine   Clam 10 reps;1 second  abdominal brace/pelvic contraction; 2 sets bil.    Bent Knee Raise 10 reps;1 second  abdominal brace/pelvic floor contraction 2x     Knee/Hip Exercises: Standing   Other Standing Knee Exercises sit to stand 10x with pelvic floor contraction                PT Education - 10/22/16 1140    Education provided Yes   Education Details how to incorporate exercises into his busy work day   Person(s) Educated Patient   Methods Explanation;Demonstration    Comprehension Verbalized understanding;Returned demonstration          PT Short Term Goals - 05/03/16 1207      PT SHORT TERM GOAL #1   Title Pt will get full level 1-2 knee HEP for AROM, strength.    Time 1   Period Days   Status Achieved           PT Long Term Goals - 10/22/16 1107      PT LONG TERM GOAL #1   Title independent with HEP   Time 6   Period Months   Status On-going     PT LONG TERM GOAL #2   Title urinary leakage from surgery decreased >/=75%   Time 6   Period Months   Status On-going  20% better     PT LONG TERM GOAL #3   Title understand scar massage to keep mobility of tissue   Time 6   Period Months   Status Achieved     PT LONG TERM GOAL #4   Title pelvic floor strength is 4/5 to reduce urinary leakage   Time 6   Period Months   Status On-going     PT LONG TERM GOAL #5   Title reduction of wearing depends to 1 per day due to reduction in urinary incontinence   Time 4   Period Months   Status On-going  wear 6 pads per 24 hours               Plan - 10/22/16 1142    Clinical Impression Statement Patient reports his urinary leakage is 20% better.  Patient is able lay on his side without leaking urine.  Patient is able to not have to change her pad 5 days out of the week.  Patient has to get up 3-4 times to urinate at night.  Patient is so busy at work and tired that his is having trouble to contract the pelvic floor muscles during work activities and leaking urine. Patient will benefit from skilled therapy to reduce urinary lewakage and pelvic floor contraction.    Rehab Potential Excellent   Clinical Impairments Affecting Rehab Potential prostate cancer surgery on 05/26/2016   PT Frequency 1x / week   PT Duration Other (comment)  4 months   PT Treatment/Interventions Biofeedback;Electrical Stimulation;Therapeutic activities;Therapeutic exercise;Neuromuscular re-education;Patient/family education;Passive range of motion;Manual  techniques   PT Next Visit Plan progress abdominal exercises, pelvic floor contraction with coughing;    PT Home Exercise Plan progress as needed   Consulted and Agree with Plan of Care Patient      Patient will benefit from skilled therapeutic intervention in order to improve the following deficits and impairments:  Impaired flexibility, Decreased strength, Decreased mobility, Pain, Decreased range of motion, Postural dysfunction, Decreased activity  tolerance, Increased muscle spasms  Visit Diagnosis: Muscle weakness (generalized)  Aftercare following surgery for neoplasm     Problem List Patient Active Problem List   Diagnosis Date Noted  . Peripheral axonal neuropathy 08/06/2014  . Unspecified hereditary and idiopathic peripheral neuropathy 07/07/2014  . Lumbar stenosis with neurogenic claudication 12/23/2013  . Other forms of epilepsy and recurrent seizures without mention of intractable epilepsy 09/16/2013  . Prostate cancer [facility]) 07/03/2012    Earlie Counts, PT 10/22/16 11:46 AM   [facility] [address], [place], [place], [address] Phone: [phone]   Fax:  [phone]  Name: Kyzen Rainge MRN: [id number] Date of Birth: 04-15-1951

## 2016-10-25 ENCOUNTER — Other Ambulatory Visit: Payer: Self-pay | Admitting: Adult Health

## 2016-10-25 DIAGNOSIS — R569 Unspecified convulsions: Secondary | ICD-10-CM

## 2016-10-25 NOTE — Telephone Encounter (Signed)
Called and spoke with Benjamin [name] at pt pharmacy. Advised SY,RN called in VO for rx keppra on 08/15/16 with 3 refills. Should be 1 tablet BID (750mg ). She verbalized she had rx on file and to disregard refill request. No refill needed at this time.

## 2016-11-07 ENCOUNTER — Ambulatory Visit: Payer: 59 | Admitting: Physical Therapy

## 2016-11-07 ENCOUNTER — Encounter: Payer: Self-pay | Admitting: Physical Therapy

## 2016-11-07 DIAGNOSIS — Z483 Aftercare following surgery for neoplasm: Secondary | ICD-10-CM

## 2016-11-07 DIAGNOSIS — M6281 Muscle weakness (generalized): Secondary | ICD-10-CM | POA: Diagnosis not present

## 2016-11-07 NOTE — Therapy (Signed)
[facility] [address], [place], [place], [address] Phone: [phone]   Fax:  [phone]  Physical Therapy Treatment  Patient Details  Name: Benjamin [name] MRN: [id number] Date of Birth: 15-Mar-1951 Referring Provider: Dr. Alexis Frock  Encounter Date: 11/07/2016      PT End of Session - 11/07/16 1149    Visit Number 11   Date for PT Re-Evaluation 02/17/17   PT Start Time [id number]   PT Stop Time 1223   PT Time Calculation (min) 38 min   Activity Tolerance Patient tolerated treatment well;No increased pain   Behavior During Therapy WFL for tasks assessed/performed      Past Medical History:  Diagnosis Date  . Acute prostatitis 07/03/12   s/p prostate   . Allergy   . Anxiety   . Arthritis    stenosis lower back  . Back pain   . BPH (benign prostatic hypertrophy) with urinary obstruction   . Depression   . ED (erectile dysfunction)   . Headache(784.0)    "sinus" headaches  . History of ITP    as a child per patient  . Hypercholesterolemia   . Other forms of epilepsy and recurrent seizures without mention of intractable epilepsy 09/16/2013  . Peripheral axonal neuropathy 08/06/2014  . Prostate cancer ([place]) 07/03/12   Adenocarcinoma,gleason=3+3=6,PSA=5.0,volume=20cc  . Seizures ([place])   . Unspecified hereditary and idiopathic peripheral neuropathy 07/07/2014   Foot numbness starting 2010, hands beginning in 2014- 15 .    Past Surgical History:  Procedure Laterality Date  . COLONOSCOPY    . KNEE ARTHROSCOPY  2017  . LUMBAR LAMINECTOMY/DECOMPRESSION MICRODISCECTOMY N/A 12/23/2013   Procedure: Lumbar Laminectomy Decompression, Lumbar Two, Three, Four, Five;  Surgeon: Hosie Spangle, MD;  [facility]: [facility];  Service: Neurosurgery;  Laterality: N/A;  . LYMPHADENECTOMY Bilateral 05/25/2016   Procedure: BILATERAL PELVIC LYMPHADENECTOMY;  Surgeon: Alexis Frock, MD;  Location: [facility];  Service: Urology;   Laterality: Bilateral;  . ROBOT ASSISTED LAPAROSCOPIC RADICAL PROSTATECTOMY N/A 05/25/2016   Procedure: XI ROBOTIC ASSISTED LAPAROSCOPIC RADICAL PROSTATECTOMY WITH INDOCYANINE GREEN DYE;  Surgeon: Alexis Frock, MD;  Location: [facility];  Service: Urology;  Laterality: N/A;  . TOE SURGERY      There were no vitals filed for this visit.      Subjective Assessment - 11/07/16 1148    Subjective i have not been able to exercise due to the stress and business of work at this time. I was able to hold my urine for 1 hour. I am not having pain.    Pertinent History Prostate CA, surgery scheduled for 05/25/16.     Limitations Lifting;Standing;Walking   How long can you sit comfortably? soreness in pelvis   How long can you stand comfortably? no pain   How long can you walk comfortably? no pain   Diagnostic tests MRI prior to surgery.    Patient Stated Goals reduce urinary incontinence   Currently in Pain? No/denies            [facility] PT Assessment - 11/07/16 0001      Strength   Overall Strength Comments bil. hip abduction 4/5; hip extension 3/5                             PT Education - 11/07/16 1213    Education Details pelvic floor strengthening, hip exercises, posture and importance of doing his exercises   Person(s) Educated  Patient   Methods Explanation;Demonstration;Verbal cues;Handout   Comprehension Returned demonstration;Verbalized understanding          PT Short Term Goals - 05/03/16 1207      PT SHORT TERM GOAL #1   Title Pt will get full level 1-2 knee HEP for AROM, strength.    Time 1   Period Days   Status Achieved           PT Long Term Goals - 11/07/16 1150      PT LONG TERM GOAL #1   Title independent with HEP   Period Months   Status On-going     PT LONG TERM GOAL #2   Title urinary leakage from surgery decreased >/=75%   Time 6   Period Months   Status On-going  not able to exercise due to schedule     PT LONG TERM GOAL  #3   Title understand scar massage to keep mobility of tissue   Time 6   Period Months   Status Achieved     PT LONG TERM GOAL #4   Title pelvic floor strength is 4/5 to reduce urinary leakage   Time 6   Period Months   Status On-going  not able to exercise due to schedule     PT LONG TERM GOAL #5   Title reduction of wearing depends to 1 per day due to reduction in urinary incontinence   Time 4   Period Months   Status On-going  5 pads during day               Plan - 11/07/16 1151    Clinical Impression Statement Patient has had a hectic schedule at work so he was not able to do his exercises. Therapist has emphasized the importance of exercise to achieve continence. Patient has not achieved goals. Patient will release urine with standing hip abduction and needs verbal cues to contract the pelvic floor.  Patient has difficulty with clentching his buttocks due to his posture.  Patient was able to hold his urine for 1 hour several times. Patient will benefit from skilled therapy to reduce urinary leakage and pelvic floor contraction.    Rehab Potential Excellent   Clinical Impairments Affecting Rehab Potential prostate cancer surgery on 05/26/2016   PT Frequency 1x / week   PT Duration Other (comment)  4 months   PT Treatment/Interventions Biofeedback;Electrical Stimulation;Therapeutic activities;Therapeutic exercise;Neuromuscular re-education;Patient/family education;Passive range of motion;Manual techniques   PT Next Visit Plan progress abdominal exercises, pelvic floor contraction with coughing;    PT Home Exercise Plan progress as needed   Consulted and Agree with Plan of Care Patient      Patient will benefit from skilled therapeutic intervention in order to improve the following deficits and impairments:  Impaired flexibility, Decreased strength, Decreased mobility, Pain, Decreased range of motion, Postural dysfunction, Decreased activity tolerance, Increased muscle  spasms  Visit Diagnosis: Muscle weakness (generalized)  Aftercare following surgery for neoplasm     Problem List Patient Active Problem List   Diagnosis Date Noted  . Peripheral axonal neuropathy 08/06/2014  . Unspecified hereditary and idiopathic peripheral neuropathy 07/07/2014  . Lumbar stenosis with neurogenic claudication 12/23/2013  . Other forms of epilepsy and recurrent seizures without mention of intractable epilepsy 09/16/2013  . Prostate cancer [facility]) 07/03/2012    Earlie Counts, PT 11/07/16 12:27 PM   [facility] [address], [place], [place], [address] Phone: [phone]   Fax:  [phone]  Name: Benjamin [name] MRN: [id number] Date of Birth: Aug 17, 1951

## 2016-11-07 NOTE — Patient Instructions (Addendum)
Abdominal Bracing With Pelvic Floor (Hook-Lying)    With neutral spine, tighten pelvic floor and abdominals. Do not bulge stomach out.  Blow out while contracting stomach. Repeat _10__ times. Do __1_ times a day. Keep breathing.  5 times in sitting, then 5 standing.  Relax shoulders in sitting. Copyright  VHI. All rights reserved.  Bracing With Bridging (Hook-Lying)    With neutral spine, tighten pelvic floor and abdominals and hold. Lift bottom. Repeat 10___ times. Do _1__ times a day.   Copyright  VHI. All rights reserved.  Hamstring Step 1    Straighten left knee. Keep knee level with other knee or on bolster. Hold _30__ seconds. Relax knee by returning foot to start. Repeat _2__ times.Do each side.  Copyright  VHI. All rights reserved.  Physical Therapy      Lying on your stomach, place a towel under the thigh and place a strap around the ankle. Keeping the knees together, slowly pull the foot towards the buttocks until you feel a gentle stretch in the front of the thigh. Hold 30 sec. 2 times each leg 1 time per day.      Quick Contraction: Gravity Resisted (Standing)    Standing, quickly squeeze then fully relax pelvic floor. Perform __1_ sets of _5__. Rest for 1___ seconds between sets. Do __3_ times a day.  Copyright  VHI. All rights reserved. Slow Contraction: Gravity Resisted (Standing)    Standing, slowly squeeze pelvic floor for __15_ seconds. Rest for _5__ seconds. Repeat _5__ times. Do _3__ times a day.  Strengthening: Hip Abduction - Resisted    With tubing around right leg, other side toward anchor, extend leg out from side.Tighten pelvic floor.  Repeat __15__ times per set. Do __2__ sets per session. Do _1___ sessions per day. Blue band. Do on each leg http://orth.exer.us/634   Copyright  VHI. All rights reserved.  Strengthening: Hip Extension - Resisted    With tubing around right ankle, face anchor and pull leg straight back.  Tighten pelvic floor. Repeat __15__ times per set. Do __2__ sets per session. Do __1__ sessions per day. Use blue band. http://orth.exer.us/636   Foot Triangle / [employer], feet hip-width apart, on [employer]. Even weight between feet. Even weight between ball and heel of each foot. Press feet into floor. Feel postural muscles contract. Practice frequently during the day.  Copyright  VHI. All rights reserved.  [place] [address] [place], [place] [address] Phone # [phone] Fax [phone]

## 2016-11-23 ENCOUNTER — Encounter: Payer: Self-pay | Admitting: Physical Therapy

## 2016-11-23 ENCOUNTER — Ambulatory Visit: Payer: 59 | Attending: Urology | Admitting: Physical Therapy

## 2016-11-23 DIAGNOSIS — M6281 Muscle weakness (generalized): Secondary | ICD-10-CM | POA: Diagnosis present

## 2016-11-23 DIAGNOSIS — Z483 Aftercare following surgery for neoplasm: Secondary | ICD-10-CM

## 2016-11-23 NOTE — Therapy (Signed)
[facility] [address] [place], [place], [address] Phone: [phone]   Fax:  [phone]  Physical Therapy Treatment  Patient Details  Name: Benjamin [name] MRN: [id number] Date of Birth: [birth year] Referring Provider: Dr. Alexis Frock  Encounter Date: 11/23/2016      PT End of Session - 11/23/16 1100    Visit Number 12   Date for PT Re-Evaluation 02/17/17   PT Start Time [id number]   PT Stop Time 1100   PT Time Calculation (min) 45 min   Activity Tolerance Patient tolerated treatment well;No increased pain   Behavior During Therapy WFL for tasks assessed/performed      Past Medical History:  Diagnosis Date  . Acute prostatitis 07/03/12   s/p prostate   . Allergy   . Anxiety   . Arthritis    stenosis lower back  . Back pain   . BPH (benign prostatic hypertrophy) with urinary obstruction   . Depression   . ED (erectile dysfunction)   . Headache(784.0)    "sinus" headaches  . History of ITP    as a child per patient  . Hypercholesterolemia   . Other forms of epilepsy and recurrent seizures without mention of intractable epilepsy 09/16/2013  . Peripheral axonal neuropathy 08/06/2014  . Prostate cancer ([place]) 07/03/12   Adenocarcinoma,gleason=3+3=6,PSA=5.0,volume=20cc  . Seizures ([place])   . Unspecified hereditary and idiopathic peripheral neuropathy 07/07/2014   Foot numbness starting 2010, hands beginning in 2014- 15 .    Past Surgical History:  Procedure Laterality Date  . COLONOSCOPY    . KNEE ARTHROSCOPY  2017  . LUMBAR LAMINECTOMY/DECOMPRESSION MICRODISCECTOMY N/A 12/23/2013   Procedure: Lumbar Laminectomy Decompression, Lumbar Two, Three, Four, Five;  Surgeon: Hosie Spangle, MD;  Location: [facility];  Service: Neurosurgery;  Laterality: N/A;  . LYMPHADENECTOMY Bilateral 05/25/2016   Procedure: BILATERAL PELVIC LYMPHADENECTOMY;  Surgeon: Alexis Frock, MD;  Location: [facility];  Service: Urology;   Laterality: Bilateral;  . ROBOT ASSISTED LAPAROSCOPIC RADICAL PROSTATECTOMY N/A 05/25/2016   Procedure: XI ROBOTIC ASSISTED LAPAROSCOPIC RADICAL PROSTATECTOMY WITH INDOCYANINE GREEN DYE;  Surgeon: Alexis Frock, MD;  Location: [facility];  Service: Urology;  Laterality: N/A;  . TOE SURGERY      There were no vitals filed for this visit.      Subjective Assessment - 11/23/16 1020    Subjective I have been doing my exercises.  I wake up 3-4 times per night to go to the bathroom. I wake up not due to the urge but just waking up. When I change my pad it is damp and  pad is more times damp. In the moring is heavy wet the pads. Patient changes his pad often due to fear of urine smell.    Pertinent History Prostate CA, surgery scheduled for 05/25/16.     Limitations Lifting;Standing;Walking   How long can you sit comfortably? soreness in pelvis   How long can you stand comfortably? no pain   How long can you walk comfortably? no pain   Diagnostic tests MRI prior to surgery.    Patient Stated Goals reduce urinary incontinence   Currently in Pain? No/denies                         [facility] Adult PT Treatment/Exercise - 11/23/16 0001      Self-Care   Self-Care Other Self-Care Comments   Other Self-Care Comments  discussed with patient no not urinating when he  wakes up in the middle of the night if he does not have the urge.      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 with correct breathing and pelvic floor contraction     Knee/Hip Exercises: Standing   Forward Step Up 15 reps;Both;Hand Hold: 0;Step Height: 8"   Forward Step Up Limitations pelvic floor contraction and correct breathing   Step Down 15 reps;Hand Hold: 0;Step Height: 8";Both   Step Down Limitations pelvic floor contraction and breathing out   Stairs up and down stairs with                 PT Education - 11/23/16 1059    Education provided Yes   Education Details how to correctly breath and contract pelvic floor muscles with daily tasks   Person(s) Educated Patient   Methods Explanation;Demonstration   Comprehension Verbalized understanding;Returned demonstration          PT Short Term Goals - 05/03/16 1207      PT SHORT TERM GOAL #1   Title Pt will get full level 1-2 knee HEP for AROM, strength.    Time 1   Period Days   Status Achieved           PT Long Term Goals - 11/23/16 1026      PT LONG TERM GOAL #1   Title independent with HEP   Time 6   Period Months   Status On-going     PT LONG TERM GOAL #2   Title urinary leakage from surgery decreased >/=75%   Time 6   Status On-going  25% better     PT LONG TERM GOAL #3   Title understand scar massage to keep mobility of tissue   Time 6   Period Months   Status Achieved     PT LONG TERM GOAL #4   Title pelvic floor strength is 4/5 to reduce urinary leakage   Time 6   Period Months   Status On-going     PT LONG TERM GOAL #5   Title reduction of wearing depends to 1 per day due to reduction in urinary incontinence   Time 4   Period Months   Status On-going  5 pads               Plan - 11/23/16 1100    Clinical Impression Statement Patient is more consistent with his HEP.  Patient understands how to correct his posture, breath and contract pelvic floor during daily tasks but continues to need verbal cues.  Patient urinary leakage is 25% better.  Patient is now able to exercise  due to work schedule is better.  Patient will benefit from skilled therapy to reduce urinary leakage.    Rehab Potential Excellent   Clinical Impairments Affecting Rehab Potential prostate cancer surgery on 05/26/2016   PT Frequency 1x / week   PT Duration Other (comment)  4 months   PT Treatment/Interventions Biofeedback;Electrical Stimulation;Therapeutic activities;Therapeutic exercise;Neuromuscular  re-education;Patient/family education;Passive range of motion;Manual techniques   PT Next Visit Plan progress abdominal exercises, pelvic floor contraction with coughing;    PT Home Exercise Plan progress as needed   Consulted and Agree with Plan of Care Patient      Patient will benefit from skilled therapeutic intervention in order to improve the following deficits and impairments:  Impaired flexibility, Decreased strength, Decreased mobility, Pain, Decreased range of motion, Postural dysfunction, Decreased activity tolerance, Increased muscle spasms  Visit Diagnosis: Muscle weakness (generalized)  Aftercare following surgery for neoplasm     Problem List Patient Active Problem List   Diagnosis Date Noted  . Peripheral axonal neuropathy 08/06/2014  . Unspecified hereditary and idiopathic peripheral neuropathy 07/07/2014  . Lumbar stenosis with neurogenic claudication 12/23/2013  . Other forms of epilepsy and recurrent seizures without mention of intractable epilepsy 09/16/2013  . Prostate cancer [facility]) 07/03/2012    Earlie Counts, PT 11/23/16 11:03 AM   [facility] [address] [place], [place], [address] Phone: [phone]   Fax:  [phone]  Name: Benjamin [name] MRN: [id number] Date of Birth: 1951-09-17

## 2016-12-11 ENCOUNTER — Ambulatory Visit: Payer: 59 | Admitting: Physical Therapy

## 2016-12-11 ENCOUNTER — Encounter: Payer: Self-pay | Admitting: Physical Therapy

## 2016-12-11 DIAGNOSIS — M6281 Muscle weakness (generalized): Secondary | ICD-10-CM | POA: Diagnosis not present

## 2016-12-11 DIAGNOSIS — Z483 Aftercare following surgery for neoplasm: Secondary | ICD-10-CM

## 2016-12-11 NOTE — Therapy (Signed)
[facility] [address] [place], [place], [address] Phone: [phone]   Fax:  [phone]  Physical Therapy Treatment  Patient Details  Name: Benjamin [name] MRN: [id number] Date of Birth: [birth year] Referring Provider: Dr. Alexis Frock  Encounter Date: 12/11/2016      PT End of Session - 12/11/16 1101    Visit Number 13   Date for PT Re-Evaluation 02/17/17   PT Start Time [id number]   PT Stop Time 1100   PT Time Calculation (min) 45 min   Activity Tolerance Patient tolerated treatment well;No increased pain   Behavior During Therapy WFL for tasks assessed/performed      Past Medical History:  Diagnosis Date  . Acute prostatitis 07/03/12   s/p prostate   . Allergy   . Anxiety   . Arthritis    stenosis lower back  . Back pain   . BPH (benign prostatic hypertrophy) with urinary obstruction   . Depression   . ED (erectile dysfunction)   . Headache(784.0)    "sinus" headaches  . History of ITP    as a child per patient  . Hypercholesterolemia   . Other forms of epilepsy and recurrent seizures without mention of intractable epilepsy 09/16/2013  . Peripheral axonal neuropathy 08/06/2014  . Prostate cancer ([place]) 07/03/12   Adenocarcinoma,gleason=3+3=6,PSA=5.0,volume=20cc  . Seizures ([place])   . Unspecified hereditary and idiopathic peripheral neuropathy 07/07/2014   Foot numbness starting 2010, hands beginning in 2014- 15 .    Past Surgical History:  Procedure Laterality Date  . COLONOSCOPY    . KNEE ARTHROSCOPY  2017  . LUMBAR LAMINECTOMY/DECOMPRESSION MICRODISCECTOMY N/A 12/23/2013   Procedure: Lumbar Laminectomy Decompression, Lumbar Two, Three, Four, Five;  Surgeon: Hosie Spangle, MD;  [facility]: [facility];  Service: Neurosurgery;  Laterality: N/A;  . LYMPHADENECTOMY Bilateral 05/25/2016   Procedure: BILATERAL PELVIC LYMPHADENECTOMY;  Surgeon: Alexis Frock, MD;  Location: [facility];  Service: Urology;   Laterality: Bilateral;  . ROBOT ASSISTED LAPAROSCOPIC RADICAL PROSTATECTOMY N/A 05/25/2016   Procedure: XI ROBOTIC ASSISTED LAPAROSCOPIC RADICAL PROSTATECTOMY WITH INDOCYANINE GREEN DYE;  Surgeon: Alexis Frock, MD;  Location: [facility];  Service: Urology;  Laterality: N/A;  . TOE SURGERY      There were no vitals filed for this visit.      Subjective Assessment - 12/11/16 1026    Subjective I use 1 pad at night.  I wake up 3 times per night to urinate. I use the same number of pads but they are not as wet. Patient pad during night is dry but wears for just in case.    Pertinent History Prostate CA, surgery scheduled for 05/25/16.     Limitations Lifting;Standing;Walking   How long can you sit comfortably? soreness in pelvis   How long can you stand comfortably? no pain   How long can you walk comfortably? no pain   Diagnostic tests MRI prior to surgery.    Patient Stated Goals reduce urinary incontinence   Currently in Pain? No/denies                         [facility] Adult PT Treatment/Exercise - 12/11/16 0001      Self-Care   Self-Care Other Self-Care Comments   Other Self-Care Comments  patient had the urge to void then sat with 5 quick flicks and left after the urge went away; learn how to do a body scan to reduce tension of pelvic floor  muscles so they are not constanly in a contracted state     Lumbar Exercises: Aerobic   Stationary Bike 10 min. level 3     Knee/Hip Exercises: Standing   Forward Step Up 20 reps;Left;Right;Step Height: 6"  contracting the pelvic floor   Walking with Sports Cord side step 10x with 20# bil. sides with VC to relax body and contract core   Other Standing Knee Exercises standing knee flexion with red band 30x bil. with VC to contract pelvic floor;    Other Standing Knee Exercises lean on mat in staning and perform hip extension 20x each leg with pelvic floor contraction                PT Education - 12/11/16 1101     Education provided No          PT Short Term Goals - 05/03/16 1207      PT SHORT TERM GOAL #1   Title Pt will get full level 1-2 knee HEP for AROM, strength.    Time 1   Period Days   Status Achieved           PT Long Term Goals - 12/11/16 1106      PT LONG TERM GOAL #1   Title independent with HEP   Time 6   Period Months   Status On-going  still learning     PT LONG TERM GOAL #2   Title urinary leakage from surgery decreased >/=75%   Time 6   Period Months   Status On-going  25% better     PT LONG TERM GOAL #3   Title understand scar massage to keep mobility of tissue   Time 6   Period Months   Status Achieved     PT LONG TERM GOAL #4   Title pelvic floor strength is 4/5 to reduce urinary leakage   Time 6   Period Months   Status On-going     PT LONG TERM GOAL #5   Title reduction of wearing depends to 1 per day due to reduction in urinary incontinence   Time 4   Period Months   Status On-going  2 per day 1 at night               Plan - 12/11/16 1101    Clinical Impression Statement Patient reports his pads during the day have been dryer and at night they are dry.  Patient wears a pad at night just in case. Patient is understanding how to perform the urge to void exercise  at therapy. Patient is going to try to get back to the gym this week.  His schedule has been very busy and stressful at work making it difficult for him to exercise.  Patient has learned about body scanning to relax the muscle tension in his body.  Patient will benefit from skilled therapy to reduce urinary leakage.    Rehab Potential Excellent   Clinical Impairments Affecting Rehab Potential prostate cancer surgery on 05/26/2016   PT Frequency 1x / week   PT Duration Other (comment)  4 months   PT Treatment/Interventions Biofeedback;Electrical Stimulation;Therapeutic activities;Therapeutic exercise;Neuromuscular re-education;Patient/family education;Passive range of  motion;Manual techniques   PT Next Visit Plan progress abdominal exercises, go over body scanning and pelvic floor relaxation exercises to reduce stress   PT Home Exercise Plan progress as needed   Consulted and Agree with Plan of Care Patient      Patient will benefit from  skilled therapeutic intervention in order to improve the following deficits and impairments:  Impaired flexibility, Decreased strength, Decreased mobility, Pain, Decreased range of motion, Postural dysfunction, Decreased activity tolerance, Increased muscle spasms  Visit Diagnosis: Muscle weakness (generalized)  Aftercare following surgery for neoplasm     Problem List Patient Active Problem List   Diagnosis Date Noted  . Peripheral axonal neuropathy 08/06/2014  . Unspecified hereditary and idiopathic peripheral neuropathy 07/07/2014  . Lumbar stenosis with neurogenic claudication 12/23/2013  . Other forms of epilepsy and recurrent seizures without mention of intractable epilepsy 09/16/2013  . Prostate cancer [facility]) 07/03/2012    Earlie Counts, PT 12/11/16 11:09 AM   [facility] [address] [place], [place], [address] Phone: [phone]   Fax:  [phone]  Name: Matisse Duffer MRN: [id number] Date of Birth: 01-29-1951

## 2016-12-12 ENCOUNTER — Encounter: Payer: 59 | Admitting: Physical Therapy

## 2016-12-19 ENCOUNTER — Encounter: Payer: Self-pay | Admitting: Physical Therapy

## 2016-12-19 ENCOUNTER — Ambulatory Visit: Payer: 59 | Attending: Urology | Admitting: Physical Therapy

## 2016-12-19 DIAGNOSIS — M6281 Muscle weakness (generalized): Secondary | ICD-10-CM | POA: Diagnosis not present

## 2016-12-19 DIAGNOSIS — Z483 Aftercare following surgery for neoplasm: Secondary | ICD-10-CM | POA: Insufficient documentation

## 2016-12-19 NOTE — Therapy (Signed)
[facility] [address] [place], [place], [address] Phone: [phone]   Fax:  [phone]  Physical Therapy Treatment  Patient Details  Name: Benjamin [name] MRN: [id number] Date of Birth: 04-Dec-1950 Referring Provider: Dr. Alexis Frock  Encounter Date: 12/19/2016      PT End of Session - 12/19/16 1036    Visit Number 14   Date for PT Re-Evaluation 02/17/17   PT Start Time [id number]   PT Stop Time 1100   PT Time Calculation (min) 45 min   Activity Tolerance Patient tolerated treatment well;No increased pain   Behavior During Therapy [facility] for tasks assessed/performed      Past Medical History:  Diagnosis Date  . Acute prostatitis 07/03/12   s/p prostate   . Allergy   . Anxiety   . Arthritis    stenosis lower back  . Back pain   . BPH (benign prostatic hypertrophy) with urinary obstruction   . Depression   . ED (erectile dysfunction)   . Headache(784.0)    "sinus" headaches  . History of ITP    as a child per patient  . Hypercholesterolemia   . Other forms of epilepsy and recurrent seizures without mention of intractable epilepsy 09/16/2013  . Peripheral axonal neuropathy 08/06/2014  . Prostate cancer ([place]) 07/03/12   Adenocarcinoma,gleason=3+3=6,PSA=5.0,volume=20cc  . Seizures ([place])   . Unspecified hereditary and idiopathic peripheral neuropathy 07/07/2014   Foot numbness starting 2010, hands beginning in 2014- 15 .    Past Surgical History:  Procedure Laterality Date  . COLONOSCOPY    . KNEE ARTHROSCOPY  2017  . LUMBAR LAMINECTOMY/DECOMPRESSION MICRODISCECTOMY N/A 12/23/2013   Procedure: Lumbar Laminectomy Decompression, Lumbar Two, Three, Four, Five;  Surgeon: Hosie Spangle, MD;  [facility]: [facility];  Service: Neurosurgery;  Laterality: N/A;  . LYMPHADENECTOMY Bilateral 05/25/2016   Procedure: BILATERAL PELVIC LYMPHADENECTOMY;  Surgeon: Alexis Frock, MD;  Location: [facility];  Service: Urology;   Laterality: Bilateral;  . ROBOT ASSISTED LAPAROSCOPIC RADICAL PROSTATECTOMY N/A 05/25/2016   Procedure: XI ROBOTIC ASSISTED LAPAROSCOPIC RADICAL PROSTATECTOMY WITH INDOCYANINE GREEN DYE;  Surgeon: Alexis Frock, MD;  Location: [facility];  Service: Urology;  Laterality: N/A;  . TOE SURGERY      There were no vitals filed for this visit.      Subjective Assessment - 12/19/16 1020    Subjective I saw Dr. Tammi Klippel last week and everything looked good.  MD said the muscles were strong. I am not changing pads at night. Patient gets up 2-3 times per night.  I need more control in the morning. I drink water, diet soda or tea in the morning.    Pertinent History Prostate CA, surgery scheduled for 05/25/16.     Limitations Lifting;Standing;Walking   How long can you sit comfortably? soreness in pelvis   How long can you stand comfortably? no pain   How long can you walk comfortably? no pain   Diagnostic tests MRI prior to surgery.    Patient Stated Goals reduce urinary incontinence   Currently in Pain? No/denies                         [facility] Adult PT Treatment/Exercise - 12/19/16 0001      Lumbar Exercises: Aerobic   Stationary Bike 10 min. level 3     Lumbar Exercises: Machines for Strengthening   Cybex Knee Extension 15# 2x10 wit pelvic contraction   Cybex Knee Flexion 30# 2  x10 with pelvic contraciotn   Other Lumbar Machine Exercise lat bar 20# 2x10 with pelvic floor contraction   Other Lumbar Machine Exercise stand tower bil. shoulder extension 2x10 with pelvic floor contraction;  bilateral shoulder retraction 20# 2x10 with pelvic floor contraction                PT Education - 12/19/16 1100    Education provided Yes   Education Details bladder irritants and drinking more water   Person(s) Educated Patient   Methods Explanation;Handout   Comprehension Verbalized understanding          PT Short Term Goals - 05/03/16 1207      PT SHORT TERM GOAL #1   Title  Pt will get full level 1-2 knee HEP for AROM, strength.    Time 1   Period Days   Status Achieved           PT Long Term Goals - 12/19/16 1034      PT LONG TERM GOAL #1   Title independent with HEP   Time 6   Period Months   Status On-going     PT LONG TERM GOAL #2   Title urinary leakage from surgery decreased >/=75%   Time 6   Period Months   Status On-going  30% better     PT LONG TERM GOAL #3   Title understand scar massage to keep mobility of tissue   Time 6   Period Months   Status Achieved     PT LONG TERM GOAL #4   Title pelvic floor strength is 4/5 to reduce urinary leakage   Time 6   Period Months   Status On-going     PT LONG TERM GOAL #5   Title reduction of wearing depends to 1 per day due to reduction in urinary incontinence   Time 4   Period Months   Status On-going  3 during day, 1 at night just incase               Plan - 12/19/16 1037    Clinical Impression Statement Paitent reports he is 30% better. Patient wears 1 pad just in case at night and 3 during the day.  Patient has no urinary leakage at night. Patient wakes up 3 times per night. Patient leaked 2 times during exercise due to bearing down instead of cotracing pelvic floor upward. Patient understands what bladder irritants are due to him drinking tea and sodas that could irritate the bladder. Patient will benefit from skilled therapy to reduce urinary leakage.    Rehab Potential Excellent   Clinical Impairments Affecting Rehab Potential prostate cancer surgery on 05/26/2016   PT Frequency 1x / week   PT Duration Other (comment)  4 months   PT Treatment/Interventions Biofeedback;Electrical Stimulation;Therapeutic activities;Therapeutic exercise;Neuromuscular re-education;Patient/family education;Passive range of motion;Manual techniques   PT Next Visit Plan quick flicks and hold pelvic floor in different postions   PT Home Exercise Plan progress as needed   Consulted and Agree  with Plan of Care Patient      Patient will benefit from skilled therapeutic intervention in order to improve the following deficits and impairments:  Impaired flexibility, Decreased strength, Decreased mobility, Pain, Decreased range of motion, Postural dysfunction, Decreased activity tolerance, Increased muscle spasms  Visit Diagnosis: Muscle weakness (generalized)  Aftercare following surgery for neoplasm     Problem List Patient Active Problem List   Diagnosis Date Noted  . Peripheral axonal neuropathy 08/06/2014  .  Unspecified hereditary and idiopathic peripheral neuropathy 07/07/2014  . Lumbar stenosis with neurogenic claudication 12/23/2013  . Other forms of epilepsy and recurrent seizures without mention of intractable epilepsy 09/16/2013  . Prostate cancer [facility]) 07/03/2012    Earlie Counts, PT 12/19/16 11:03 AM   [facility] [address], [place], [place], [address] Phone: [phone]   Fax:  [phone]  Name: Benjamin [name] MRN: [id number] Date of Birth: 1951/06/19

## 2016-12-19 NOTE — Patient Instructions (Signed)
Certain foods and liquids will decrease the pH making the urine more acidic.  Urinary urgency increases when the urine has a low pH.  Most common irritants: alcohol, carbonated beverages and caffinated beverages.  Foods to avoid: apple juice, apples, ascorbic acid, canteloupes, chili, citrus fruits, coffee, cranberries, grapes, guava, peaches, pepper, pineapple, plums, strawberries, tea, tomatoes, and vinegar.  Drinking plenty of water may help to increase the pH and dilute out any of the effects of specific irritants.  Foods that are NOT irritating to the bladder include: Pears, papayas, sun-brewed teas, watermelons, non-citrus herbal teas, apricots, kava and low-acid instant drinks (Postum)  Drink 105 ounces of water per day.   [facility] [address] [place], [place] [address] Phone # [phone] Fax [phone]

## 2017-01-04 ENCOUNTER — Ambulatory Visit: Payer: 59 | Admitting: Physical Therapy

## 2017-01-04 ENCOUNTER — Encounter: Payer: Self-pay | Admitting: Physical Therapy

## 2017-01-04 DIAGNOSIS — M6281 Muscle weakness (generalized): Secondary | ICD-10-CM | POA: Diagnosis not present

## 2017-01-04 DIAGNOSIS — Z483 Aftercare following surgery for neoplasm: Secondary | ICD-10-CM

## 2017-01-04 NOTE — Therapy (Signed)
[facility] [address] [place], [place], [address] Phone: [phone]   Fax:  [phone]  Physical Therapy Treatment  Patient Details  Name: Benjamin [name] MRN: [id number] Date of Birth: June 11, 1951 Referring Provider: Dr. Alexis Frock  Encounter Date: 01/04/2017      PT End of Session - 01/04/17 1003    Visit Number 15   Date for PT Re-Evaluation 02/17/17   PT Start Time 0915   PT Stop Time 1011   PT Time Calculation (min) 56 min   Activity Tolerance Patient tolerated treatment well   Behavior During Therapy [facility] for tasks assessed/performed      Past Medical History:  Diagnosis Date  . Acute prostatitis 07/03/12   s/p prostate   . Allergy   . Anxiety   . Arthritis    stenosis lower back  . Back pain   . BPH (benign prostatic hypertrophy) with urinary obstruction   . Depression   . ED (erectile dysfunction)   . Headache(784.0)    "sinus" headaches  . History of ITP    as a child per patient  . Hypercholesterolemia   . Other forms of epilepsy and recurrent seizures without mention of intractable epilepsy 09/16/2013  . Peripheral axonal neuropathy 08/06/2014  . Prostate cancer ([place]) 07/03/12   Adenocarcinoma,gleason=3+3=6,PSA=5.0,volume=20cc  . Seizures ([place])   . Unspecified hereditary and idiopathic peripheral neuropathy 07/07/2014   Foot numbness starting 2010, hands beginning in 2014- 15 .    Past Surgical History:  Procedure Laterality Date  . COLONOSCOPY    . KNEE ARTHROSCOPY  2017  . LUMBAR LAMINECTOMY/DECOMPRESSION MICRODISCECTOMY N/A 12/23/2013   Procedure: Lumbar Laminectomy Decompression, Lumbar Two, Three, Four, Five;  Surgeon: Hosie Spangle, MD;  [facility]: [facility];  Service: Neurosurgery;  Laterality: N/A;  . LYMPHADENECTOMY Bilateral 05/25/2016   Procedure: BILATERAL PELVIC LYMPHADENECTOMY;  Surgeon: Alexis Frock, MD;  Location: [facility];  Service: Urology;  Laterality: Bilateral;   . ROBOT ASSISTED LAPAROSCOPIC RADICAL PROSTATECTOMY N/A 05/25/2016   Procedure: XI ROBOTIC ASSISTED LAPAROSCOPIC RADICAL PROSTATECTOMY WITH INDOCYANINE GREEN DYE;  Surgeon: Alexis Frock, MD;  Location: [facility];  Service: Urology;  Laterality: N/A;  . TOE SURGERY      There were no vitals filed for this visit.      Subjective Assessment - 01/04/17 0931    Subjective I am doing things differently.  I am holding my pelvic floor contraction while walking around the house.  No problem at night.  I wear a pad all night long and it is dry.  Mornings are a problem. Trying to just use 2 pads per work hours. When I concentrate I am able to hold my urine but when busy I will forget.  I am going to [employer].    Pertinent History Prostate CA, surgery scheduled for 05/25/16.     Limitations Lifting;Standing;Walking   How long can you sit comfortably? soreness in pelvis   How long can you stand comfortably? no pain   How long can you walk comfortably? no pain   Diagnostic tests MRI prior to surgery.    Patient Stated Goals reduce urinary incontinence   Currently in Pain? No/denies                         [facility] Adult PT Treatment/Exercise - 01/04/17 0001      Lumbar Exercises: Aerobic   Elliptical Nustep; level 4 just legs seat # 12 8 min  PT was present to discuss progress with pt.                 PT Education - 01/04/17 1002    Education provided Yes   Education Details Level 1 core exercises in supine and sidelying   Person(s) Educated Patient   Methods Explanation;Demonstration;Tactile cues;Verbal cues;Handout   Comprehension Verbalized understanding;Returned demonstration          PT Short Term Goals - 05/03/16 1207      PT SHORT TERM GOAL #1   Title Pt will get full level 1-2 knee HEP for AROM, strength.    Time 1   Period Days   Status Achieved           PT Long Term Goals - 01/04/17 1014      PT LONG TERM GOAL #2   Title urinary leakage  from surgery decreased >/=75%   Period Months   Status On-going               Plan - 01/04/17 1003    Clinical Impression Statement Pt is now trying more consistently to walk around his kitchen and go up & down his stairs while contracting his pelvic floor. Pt was also given level 1 core exercises to progress his HEP this week. In supine pt did a good job contracting the lower abdominals, in sidelying it was more challenging.     Rehab Potential Excellent   Clinical Impairments Affecting Rehab Potential prostate cancer surgery on 05/26/2016   PT Frequency 1x / week   PT Duration Other (comment)   PT Treatment/Interventions Biofeedback;Electrical Stimulation;Therapeutic activities;Therapeutic exercise;Neuromuscular re-education;Patient/family education;Passive range of motion;Manual techniques   PT Next Visit Plan quick flicks and hold pelvic floor in different postions      Patient will benefit from skilled therapeutic intervention in order to improve the following deficits and impairments:  Impaired flexibility, Decreased strength, Decreased mobility, Pain, Decreased range of motion, Postural dysfunction, Decreased activity tolerance, Increased muscle spasms  Visit Diagnosis: Muscle weakness (generalized)  Aftercare following surgery for neoplasm     Problem List Patient Active Problem List   Diagnosis Date Noted  . Peripheral axonal neuropathy 08/06/2014  . Unspecified hereditary and idiopathic peripheral neuropathy 07/07/2014  . Lumbar stenosis with neurogenic claudication 12/23/2013  . Other forms of epilepsy and recurrent seizures without mention of intractable epilepsy 09/16/2013  . Prostate cancer ([place]) 07/03/2012    Demetress Tift, PTA 01/04/2017, 10:16 AM  [facility] [address], [place], [place], [address] Phone: [phone]   Fax:  [phone]  Name: Benjamin [name] MRN: [id number] Date of  Birth: 1951/05/20

## 2017-01-04 NOTE — Patient Instructions (Signed)

## 2017-01-15 ENCOUNTER — Ambulatory Visit: Payer: 59 | Admitting: Physical Therapy

## 2017-01-22 ENCOUNTER — Encounter: Payer: Self-pay | Admitting: Physical Therapy

## 2017-01-22 ENCOUNTER — Ambulatory Visit: Payer: 59 | Attending: Urology | Admitting: Physical Therapy

## 2017-01-22 DIAGNOSIS — M6281 Muscle weakness (generalized): Secondary | ICD-10-CM | POA: Diagnosis present

## 2017-01-22 DIAGNOSIS — Z483 Aftercare following surgery for neoplasm: Secondary | ICD-10-CM

## 2017-01-22 NOTE — Patient Instructions (Addendum)
Slow Contraction: Gravity Resisted (Quadruped)    On hands and knees, slowly squeeze pelvic floor for _10__ seconds. Rest for _5__ seconds. Repeat _10__ times. Do _1__ times a day.   Copyright  VHI. All rights reserved.   Quick Contraction: Gravity Resisted (Kneeling) Tall kneel   Kneeling, quickly squeeze then fully relax pelvic floor as you go into 1/2 kneel. Relax. Then contract pelvic floor to go back to tall kneel. Do not hold your breath. Contract your abdominals.   1/2 kneel    Copyright  VHI. All rights reserved.  Slow Contraction: Gravity Resisted (Semi-Reclined)    Partially recline. Slowly squeeze pelvic floor and contract abdominals as you sit up.  Breathe out. Lead with your chest. 10x daily.  Copyright  VHI. All rights reserved.  [facility] [address], [place], [place] [address] Phone # [phone] Fax [phone]

## 2017-01-22 NOTE — Therapy (Signed)
[facility] [address], [place], [place], [address] Phone: [phone]   Fax:  [phone]  Physical Therapy Treatment  Patient Details  Name: Benjamin [name] MRN: [id number] Date of Birth: [birth year] Referring Provider: Dr. Alexis Frock  Encounter Date: 01/22/2017      PT End of Session - 01/22/17 1443    Visit Number 16   Date for PT Re-Evaluation 02/17/17   PT Start Time 1410  came late and had to go to the bathroom   PT Stop Time 1444   PT Time Calculation (min) 34 min   Activity Tolerance Patient tolerated treatment well   Behavior During Therapy [facility] for tasks assessed/performed      Past Medical History:  Diagnosis Date  . Acute prostatitis 07/03/12   s/p prostate   . Allergy   . Anxiety   . Arthritis    stenosis lower back  . Back pain   . BPH (benign prostatic hypertrophy) with urinary obstruction   . Depression   . ED (erectile dysfunction)   . Headache(784.0)    "sinus" headaches  . History of ITP    as a child per patient  . Hypercholesterolemia   . Other forms of epilepsy and recurrent seizures without mention of intractable epilepsy 09/16/2013  . Peripheral axonal neuropathy 08/06/2014  . Prostate cancer ([place]) 07/03/12   Adenocarcinoma,gleason=3+3=6,PSA=5.0,volume=20cc  . Seizures ([place])   . Unspecified hereditary and idiopathic peripheral neuropathy 07/07/2014   Foot numbness starting 2010, hands beginning in 2014- 15 .    Past Surgical History:  Procedure Laterality Date  . COLONOSCOPY    . KNEE ARTHROSCOPY  2017  . LUMBAR LAMINECTOMY/DECOMPRESSION MICRODISCECTOMY N/A 12/23/2013   Procedure: Lumbar Laminectomy Decompression, Lumbar Two, Three, Four, Five;  Surgeon: Hosie Spangle, MD;  Location: [facility];  Service: Neurosurgery;  Laterality: N/A;  . LYMPHADENECTOMY Bilateral 05/25/2016   Procedure: BILATERAL PELVIC LYMPHADENECTOMY;  Surgeon: Alexis Frock, MD;  Location: [facility];   Service: Urology;  Laterality: Bilateral;  . ROBOT ASSISTED LAPAROSCOPIC RADICAL PROSTATECTOMY N/A 05/25/2016   Procedure: XI ROBOTIC ASSISTED LAPAROSCOPIC RADICAL PROSTATECTOMY WITH INDOCYANINE GREEN DYE;  Surgeon: Alexis Frock, MD;  Location: [facility];  Service: Urology;  Laterality: N/A;  . TOE SURGERY      There were no vitals filed for this visit.      Subjective Assessment - 01/22/17 1412    Subjective I did not get to see Dr. Tammi Klippel due to weather. I have been doing my exercises. I use 2 pads at work and 1 day I did 1 pad.  The pads are not as full. I cut back on tea. Urinary decreased by 30% better.    Patient Stated Goals reduce urinary incontinence   Currently in Pain? No/denies            [facility] PT Assessment - 01/22/17 0001      Strength   Overall Strength Comments left hip abduction 4/5 and right is 5/5; bil. 4/5                     [facility] Adult PT Treatment/Exercise - 01/22/17 0001      Exercises   Exercises Other Exercises   Other Exercises  quandruped contract pelvic floor, going from 1/2 kneel to tall knees and back with pelvic floor contraction , semireclined postion with partial sit up with pelvic floor contraction; reviewed past HEP and what he should be doing  PT Education - 01/22/17 1442    Education provided Yes   Education Details pelvic floor contraction in different postion, reviewed past HEP   Person(s) Educated Patient   Methods Explanation;Demonstration;Verbal cues;Handout   Comprehension Verbalized understanding;Returned demonstration          PT Short Term Goals - 05/03/16 1207      PT SHORT TERM GOAL #1   Title Pt will get full level 1-2 knee HEP for AROM, strength.    Time 1   Period Days   Status Achieved           PT Long Term Goals - 01/22/17 1446      PT LONG TERM GOAL #1   Title independent with HEP   Time 6   Period Months   Status On-going  still learning     PT LONG TERM GOAL  #2   Title urinary leakage from surgery decreased >/=75%   Time 6   Period Months   Status On-going  30%     PT LONG TERM GOAL #4   Title pelvic floor strength is 4/5 to reduce urinary leakage   Time 6   Period Months   Status On-going     PT LONG TERM GOAL #5   Title reduction of wearing depends to 1 per day due to reduction in urinary incontinence   Time 4   Period Months   Status On-going  2 pads per day               Plan - 01/22/17 1444    Clinical Impression Statement Urinary leakage is 30% better.  Patient is learning different postions he is to contract pelvic floor and transitional movements. Patient still uses 2 pads per day but they are dryer.  Patient was able to use 1 pad on one day.  Patient has increased bilateral hip strength.  Patient will benefit from skilled therapy to increased pelvic floor strength.    Rehab Potential Excellent   Clinical Impairments Affecting Rehab Potential prostate cancer surgery on 05/26/2016   PT Frequency 1x / week   PT Duration Other (comment)  4 months   PT Treatment/Interventions Biofeedback;Electrical Stimulation;Therapeutic activities;Therapeutic exercise;Neuromuscular re-education;Patient/family education;Passive range of motion;Manual techniques   PT Next Visit Plan pelvic floor EMG   PT Home Exercise Plan progress as needed   Consulted and Agree with Plan of Care Patient      Patient will benefit from skilled therapeutic intervention in order to improve the following deficits and impairments:  Impaired flexibility, Decreased strength, Decreased mobility, Pain, Decreased range of motion, Postural dysfunction, Decreased activity tolerance, Increased muscle spasms  Visit Diagnosis: Muscle weakness (generalized)  Aftercare following surgery for neoplasm     Problem List Patient Active Problem List   Diagnosis Date Noted  . Peripheral axonal neuropathy 08/06/2014  . Unspecified hereditary and idiopathic peripheral  neuropathy 07/07/2014  . Lumbar stenosis with neurogenic claudication 12/23/2013  . Other forms of epilepsy and recurrent seizures without mention of intractable epilepsy 09/16/2013  . Prostate cancer [facility]) 07/03/2012    Earlie Counts, PT 01/22/17 2:48 PM   [facility] [address] [place], [place], [address] Phone: [phone]   Fax:  [phone]  Name: Benjamin [name] MRN: [id number] Date of Birth: 1951/07/16

## 2017-01-30 ENCOUNTER — Encounter: Payer: Self-pay | Admitting: Physical Therapy

## 2017-02-19 ENCOUNTER — Ambulatory Visit: Payer: 59 | Attending: Urology | Admitting: Physical Therapy

## 2017-02-19 DIAGNOSIS — Z483 Aftercare following surgery for neoplasm: Secondary | ICD-10-CM | POA: Insufficient documentation

## 2017-02-19 DIAGNOSIS — M6281 Muscle weakness (generalized): Secondary | ICD-10-CM | POA: Diagnosis not present

## 2017-02-19 NOTE — Therapy (Signed)
[facility] [address], [place], [place], [address] Phone: [phone]   Fax:  [phone]  Physical Therapy Treatment  Patient Details  Name: Benjamin [name] MRN: [id number] Date of Birth: 1951/08/07 Referring Provider: Dr. Alexis Frock  Encounter Date: 02/19/2017      PT End of Session - 02/19/17 1655    Visit Number 17   Date for PT Re-Evaluation 05/12/17   PT Start Time 1400   PT Stop Time 1455   PT Time Calculation (min) 55 min   Activity Tolerance Patient tolerated treatment well   Behavior During Therapy [facility] for tasks assessed/performed      Past Medical History:  Diagnosis Date  . Acute prostatitis 07/03/12   s/p prostate   . Allergy   . Anxiety   . Arthritis    stenosis lower back  . Back pain   . BPH (benign prostatic hypertrophy) with urinary obstruction   . Depression   . ED (erectile dysfunction)   . Headache(784.0)    "sinus" headaches  . History of ITP    as a child per patient  . Hypercholesterolemia   . Other forms of epilepsy and recurrent seizures without mention of intractable epilepsy 09/16/2013  . Peripheral axonal neuropathy 08/06/2014  . Prostate cancer ([place]) 07/03/12   Adenocarcinoma,gleason=3+3=6,PSA=5.0,volume=20cc  . Seizures ([place])   . Unspecified hereditary and idiopathic peripheral neuropathy 07/07/2014   Foot numbness starting 2010, hands beginning in 2014- 15 .    Past Surgical History:  Procedure Laterality Date  . COLONOSCOPY    . KNEE ARTHROSCOPY  2017  . LUMBAR LAMINECTOMY/DECOMPRESSION MICRODISCECTOMY N/A 12/23/2013   Procedure: Lumbar Laminectomy Decompression, Lumbar Two, Three, Four, Five;  Surgeon: Hosie Spangle, MD;  Location: [facility];  Service: Neurosurgery;  Laterality: N/A;  . LYMPHADENECTOMY Bilateral 05/25/2016   Procedure: BILATERAL PELVIC LYMPHADENECTOMY;  Surgeon: Alexis Frock, MD;  Location: [facility];  Service: Urology;  Laterality: Bilateral;   . ROBOT ASSISTED LAPAROSCOPIC RADICAL PROSTATECTOMY N/A 05/25/2016   Procedure: XI ROBOTIC ASSISTED LAPAROSCOPIC RADICAL PROSTATECTOMY WITH INDOCYANINE GREEN DYE;  Surgeon: Alexis Frock, MD;  Location: [facility];  Service: Urology;  Laterality: N/A;  . TOE SURGERY      There were no vitals filed for this visit.      Subjective Assessment - 02/19/17 1627    Subjective I had a stomach virus last week.  I am back to exercise. I can exercise more. When change pads they are more dry. Patient feels pressure and pain when he has to go.    Pertinent History Prostate CA, surgery scheduled for 05/25/16.     Limitations Lifting;Standing;Walking   How long can you sit comfortably? soreness in pelvis   How long can you stand comfortably? no pain   How long can you walk comfortably? no pain   Diagnostic tests MRI prior to surgery.    Patient Stated Goals reduce urinary incontinence   Currently in Pain? No/denies            [facility] PT Assessment - 02/19/17 0001      Assessment   Referring Provider Dr. Alexis Frock   Onset Date/Surgical Date 05/25/16   Prior Therapy no     Precautions   Precautions Other (comment)   Precaution Comments cancer precautions     Restrictions   Weight Bearing Restrictions No     Balance Screen   Has the patient fallen in the past 6 months No   Has the  patient had a decrease in activity level because of a fear of falling?  No   Is the patient reluctant to leave their home because of a fear of falling?  No     Home Ecologist residence     Prior Function   Level of Independence Independent   Vocation Full time employment   Vocation Requirements mailman     Cognition   Overall Cognitive Status Within Functional Limits for tasks assessed     Observation/Other Assessments   Focus on Therapeutic Outcomes (FOTO)  NT     AROM   Lumbar Flexion full   Lumbar Extension decreased by 100%   Lumbar - Right Side Bend decreased by  25%   Lumbar - Left Side Bend decreased by 25%     Strength   Overall Strength Comments left hip abduction 4/5 and right is 5/5; bil. 4/5     Transfers   Transfers Not assessed     Ambulation/Gait   Ambulation/Gait No                  Pelvic Floor Special Questions - 02/19/17 0001    Pelvic Floor Internal Exam Patient confirms identification and approves PT to assess muscle and perform soft tissue work   Exam Type Rectal   Palpation tightness on bilateral sides of urethra and puborectalis   Strength weak squeeze, no lift           [facility] Adult PT Treatment/Exercise - 02/19/17 0001      Lumbar Exercises: Aerobic   Elliptical Nustep; level 4 just legs seat # 12 8 min  PT was present to discuss progress with pt.      Lumbar Exercises: Machines for Strengthening   Cybex Knee Extension 15# 2x10 wit pelvic contraction   Cybex Knee Flexion 30# 2 x10 with pelvic contraciotn   Other Lumbar Machine Exercise lat bar 20# 2x10 with pelvic floor contraction     Lumbar Exercises: Quadruped   Opposite Arm/Leg Raise Left arm/Right leg;Right arm/Left leg;15 reps                PT Education - 02/19/17 1654    Education provided Yes   Education Details how to elongate the penis to stretch it out and elongate the urethra. Patient will try at home.    Person(s) Educated Patient   Methods Explanation;Verbal cues   Comprehension Verbalized understanding          PT Short Term Goals - 05/03/16 1207      PT SHORT TERM GOAL #1   Title Pt will get full level 1-2 knee HEP for AROM, strength.    Time 1   Period Days   Status Achieved           PT Long Term Goals - 02/19/17 1628      PT LONG TERM GOAL #1   Title independent with HEP   Time 6   Period Months   Status On-going     PT LONG TERM GOAL #2   Title urinary leakage from surgery decreased >/=75%   Time 6   Period Months   Status On-going  30% improvement     PT LONG TERM GOAL #4   Title pelvic  floor strength is 4/5 to reduce urinary leakage   Time 6   Period Months   Status On-going     PT LONG TERM GOAL #5   Title reduction of wearing depends to  1 per day due to reduction in urinary incontinence   Time 4   Period Months   Status On-going  2-3 pads               Plan - 02/19/17 1658    Clinical Impression Statement Patient urinary leakage is 30% better and his pads are dryer.  Patient reports he changes his pads 2-3 times per day.  Patient pelvic floor strength is 2/5. Patient has tightness in the pelvic floor muscles and after soft tissue work decreased tightness and urge to urinate. Patient reports work is slower so he is able to exercise more consistently. Patient reports his penis will shrink up all the way and he has to stretch it out.  Patient will benefit from skilled therapy to work on soft tissue mobility for improve function of the urinary system and improve strength.    Rehab Potential Excellent   Clinical Impairments Affecting Rehab Potential prostate cancer surgery on 05/26/2016   PT Frequency 1x / week   PT Duration 12 weeks   PT Treatment/Interventions Biofeedback;Electrical Stimulation;Therapeutic activities;Therapeutic exercise;Neuromuscular re-education;Patient/family education;Passive range of motion;Manual techniques   PT Next Visit Plan pelvic floor EMG; soft tissue work rectally to pelvic floor muscles   PT Home Exercise Plan progress as needed   Consulted and Agree with Plan of Care Patient      Patient will benefit from skilled therapeutic intervention in order to improve the following deficits and impairments:  Impaired flexibility, Decreased strength, Decreased mobility, Pain, Decreased range of motion, Postural dysfunction, Decreased activity tolerance, Increased muscle spasms  Visit Diagnosis: Muscle weakness (generalized) - Plan: PT plan of care cert/re-cert  Aftercare following surgery for neoplasm - Plan: PT plan of care  cert/re-cert     Problem List Patient Active Problem List   Diagnosis Date Noted  . Peripheral axonal neuropathy 08/06/2014  . Unspecified hereditary and idiopathic peripheral neuropathy 07/07/2014  . Lumbar stenosis with neurogenic claudication 12/23/2013  . Other forms of epilepsy and recurrent seizures without mention of intractable epilepsy 09/16/2013  . Prostate cancer [facility]) 07/03/2012    Earlie Counts, PT 02/19/17 5:04 PM   [facility] [address] [place], [place], [address] Phone: [phone]   Fax:  [phone]  Name: Mcclain Shall MRN: [id number] Date of Birth: 1950-12-06

## 2017-02-25 ENCOUNTER — Encounter: Payer: 59 | Admitting: Physical Therapy

## 2017-03-13 ENCOUNTER — Encounter: Payer: Self-pay | Admitting: Physical Therapy

## 2017-03-13 ENCOUNTER — Ambulatory Visit: Payer: 59 | Attending: Urology | Admitting: Physical Therapy

## 2017-03-13 DIAGNOSIS — Z483 Aftercare following surgery for neoplasm: Secondary | ICD-10-CM | POA: Insufficient documentation

## 2017-03-13 DIAGNOSIS — M6281 Muscle weakness (generalized): Secondary | ICD-10-CM | POA: Diagnosis present

## 2017-03-13 NOTE — Therapy (Signed)
[facility] [address] [place], [place], [address] Phone: [phone]   Fax:  [phone]  Physical Therapy Treatment  Patient Details  Name: Benjamin [name] MRN: [id number] Date of Birth: 1951-06-02 Referring Provider: Dr. Alexis Frock  Encounter Date: 03/13/2017      PT End of Session - 03/13/17 1226    Visit Number 18   Date for PT Re-Evaluation 05/12/17   PT Start Time 1145   PT Stop Time 1230   PT Time Calculation (min) 45 min   Activity Tolerance Patient tolerated treatment well   Behavior During Therapy [facility] for tasks assessed/performed      Past Medical History:  Diagnosis Date  . Acute prostatitis 07/03/12   s/p prostate   . Allergy   . Anxiety   . Arthritis    stenosis lower back  . Back pain   . BPH (benign prostatic hypertrophy) with urinary obstruction   . Depression   . ED (erectile dysfunction)   . Headache(784.0)    "sinus" headaches  . History of ITP    as a child per patient  . Hypercholesterolemia   . Other forms of epilepsy and recurrent seizures without mention of intractable epilepsy 09/16/2013  . Peripheral axonal neuropathy 08/06/2014  . Prostate cancer ([place]) 07/03/12   Adenocarcinoma,gleason=3+3=6,PSA=5.0,volume=20cc  . Seizures ([place])   . Unspecified hereditary and idiopathic peripheral neuropathy 07/07/2014   Foot numbness starting 2010, hands beginning in 2014- 15 .    Past Surgical History:  Procedure Laterality Date  . COLONOSCOPY    . KNEE ARTHROSCOPY  2017  . LUMBAR LAMINECTOMY/DECOMPRESSION MICRODISCECTOMY N/A 12/23/2013   Procedure: Lumbar Laminectomy Decompression, Lumbar Two, Three, Four, Five;  Surgeon: Hosie Spangle, MD;  Location: [facility];  Service: Neurosurgery;  Laterality: N/A;  . LYMPHADENECTOMY Bilateral 05/25/2016   Procedure: BILATERAL PELVIC LYMPHADENECTOMY;  Surgeon: Alexis Frock, MD;  Location: [facility];  Service: Urology;  Laterality: Bilateral;   . ROBOT ASSISTED LAPAROSCOPIC RADICAL PROSTATECTOMY N/A 05/25/2016   Procedure: XI ROBOTIC ASSISTED LAPAROSCOPIC RADICAL PROSTATECTOMY WITH INDOCYANINE GREEN DYE;  Surgeon: Alexis Frock, MD;  Location: [facility];  Service: Urology;  Laterality: N/A;  . TOE SURGERY      There were no vitals filed for this visit.      Subjective Assessment - 03/13/17 1152    Subjective Went two days without changing at all. Some days I only had to change 2 times.  Riding the exercise bike 8 mile for 35 min.    Pertinent History Prostate CA, surgery scheduled for 05/25/16.     Limitations Lifting;Standing;Walking   How long can you sit comfortably? soreness in pelvis   How long can you stand comfortably? no pain   How long can you walk comfortably? no pain   Diagnostic tests MRI prior to surgery.    Currently in Pain? No/denies                      Pelvic Floor Special Questions - 03/13/17 0001    Pelvic Floor Internal Exam Patient confirms identification and approves PT to assess muscle and perform soft tissue work   Exam Type Rectal   Palpation tightness on puborectalis   Strength good squeeze, good lift, able to hold agaisnt strong resistance           [facility] Adult PT Treatment/Exercise - 03/13/17 0001      Lumbar Exercises: Aerobic   Elliptical Nustep; level 4 just legs  seat # 12 8 min  PT was present to discuss progress with pt.      Knee/Hip Exercises: Standing   Forward Lunges Left;Right;1 set;5 reps  foot on second step hold 15 sec   Wall Squat 1 set;10 reps  ball behind back, relax pelvic floor going down   Wall Squat Limitations tighten as you go up   Other Standing Knee Exercises back against wall stretch hip adductors with lunge and also forward fllexion with relaxing the pelvic floor   Other Standing Knee Exercises standing side step with red band around thigh 15 feet 4x     Manual Therapy   Manual Therapy Internal Pelvic Floor   Internal Pelvic Floor bil.  puborectalis, bil. obturator internist, ischiococcygeus in right sidely                  PT Short Term Goals - 05/03/16 1207      PT SHORT TERM GOAL #1   Title Pt will get full level 1-2 knee HEP for AROM, strength.    Time 1   Period Days   Status Achieved           PT Long Term Goals - 03/13/17 1155      PT LONG TERM GOAL #1   Title independent with HEP   Time 6   Period Months   Status On-going     PT LONG TERM GOAL #2   Title urinary leakage from surgery decreased >/=75%   Time 6   Period Months   Status On-going  50% better     PT LONG TERM GOAL #3   Title understand scar massage to keep mobility of tissue   Time 6   Period Months   Status Achieved     PT LONG TERM GOAL #4   Title pelvic floor strength is 4/5 to reduce urinary leakage   Time 6   Period Months   Status On-going     PT LONG TERM GOAL #5   Title reduction of wearing depends to 1 per day due to reduction in urinary incontinence   Time 4   Period Months   Status On-going  changes 1 time per day, slight leakage at night               Plan - 03/13/17 1226    Clinical Impression Statement Patient pelvic floor strength has increased to 4/5.  Patient has gone several days without changing his depends during workday and some days is 1 time. Patient has very little leakage during the night.  Patient will benefit from skilled therapy  to improve pelvic floor strength to improve continence.    Rehab Potential Excellent   Clinical Impairments Affecting Rehab Potential prostate cancer surgery on 05/26/2016   PT Frequency 1x / week   PT Duration 12 weeks   PT Treatment/Interventions Biofeedback;Electrical Stimulation;Therapeutic activities;Therapeutic exercise;Neuromuscular re-education;Patient/family education;Passive range of motion;Manual techniques   PT Next Visit Plan soft tissue work rectally to pelvic floor muscles, update HEP   PT Home Exercise Plan progress as needed    Consulted and Agree with Plan of Care Patient      Patient will benefit from skilled therapeutic intervention in order to improve the following deficits and impairments:  Impaired flexibility, Decreased strength, Decreased mobility, Pain, Decreased range of motion, Postural dysfunction, Decreased activity tolerance, Increased muscle spasms  Visit Diagnosis: Muscle weakness (generalized)  Aftercare following surgery for neoplasm     Problem List Patient Active Problem  List   Diagnosis Date Noted  . Peripheral axonal neuropathy 08/06/2014  . Unspecified hereditary and idiopathic peripheral neuropathy 07/07/2014  . Lumbar stenosis with neurogenic claudication 12/23/2013  . Other forms of epilepsy and recurrent seizures without mention of intractable epilepsy 09/16/2013  . Prostate cancer [facility]) 07/03/2012    Earlie Counts, PT 03/13/17 12:30 PM   [facility] [address], [place], [place], [address] Phone: [phone]   Fax:  [phone]  Name: Benjamin [name] MRN: [id number] Date of Birth: Oct 23, 1951

## 2017-03-20 ENCOUNTER — Encounter: Payer: 59 | Admitting: Physical Therapy

## 2017-04-03 ENCOUNTER — Ambulatory Visit: Payer: 59 | Admitting: Physical Therapy

## 2017-04-03 ENCOUNTER — Encounter: Payer: Self-pay | Admitting: Physical Therapy

## 2017-04-03 DIAGNOSIS — Z483 Aftercare following surgery for neoplasm: Secondary | ICD-10-CM

## 2017-04-03 DIAGNOSIS — M6281 Muscle weakness (generalized): Secondary | ICD-10-CM | POA: Diagnosis not present

## 2017-04-03 NOTE — Patient Instructions (Addendum)
Slow Contraction: Gravity Resisted (Quadruped)    On hands and knees, slowly squeeze pelvic floor for _10__ seconds. Rest for _5__ seconds. Repeat _10__ times. Do _1__ times a day. Increase to 20 seconds.   Copyright  VHI. All rights reserved.   Quick Contraction: Gravity Resisted (Kneeling) Tall kneel   Kneeling, quickly squeeze then fully relax pelvic floor as you go into 1/2 kneel. Relax. Then contract pelvic floor to go back to tall kneel. Do not hold your breath. Contract your abdominals.   1/2 kneel    Copyright  VHI. All rights reserved.  Slow Contraction: Gravity Resisted (Semi-Reclined)    Partially recline. Slowly squeeze pelvic floor and contract abdominals as you sit up.  Breathe out. Lead with your chest. 10x daily.  Lower abdominal/core stability exercises  1. Practice your breathing technique: Inhale through your nose expanding your belly and rib cage. Try not to breathe into your chest. Exhale slowly and gradually out your mouth feeling a sense of softness to your body. Practice multiple times. This can be performed unlimited.  2. Finding the lower abdominals. Laying on your back with the knees bent, place your fingers just below your belly button. Using your breathing technique from above, on your exhale gently pull the belly button away from your fingertips without tensing any other muscles. Practice this 5x. Next, as you exhale, draw belly button inwards and hold onto it...then feel as if you are pulling that muscle across your pelvis like you are tightening a belt. This can be hard to do at first so be patient and practice. Do 5-10 reps 1-3 x day. Always recognize quality over quantity; if your abdominal muscles become tired you will notice you may tighten/contract other muscles. This is the time to take a break.   Practice this first laying on your back, then in sitting, progressing to standing and finally adding it to all your daily movements.   3.  Finding your pelvic floor. Using the breathing technique above, when your exhale, this time draw your pelvic floor muscles up as if you were attempting to stop the flow of urination. Be careful NOT to tense any other muscles. This can be hard, BE PATIENT. Try to hold up to 10 seconds repeating 10x. Try 2x a day. Once you feel you are doing this well, add this contraction to exercise #2. First contracting your pelvic floor followed by lower abdominals.  4. Adding leg movements. Add the following leg movements to challenge your ability to keep your core stable:             1. Single leg drop outs: Laying on your back with knees bent feet flat. Inhale,  dropping one knee outward KEEPING YOUR PELVIS STILL. Exhale as you bring the leg back, simultaneously performing your lower abdominal contraction. Do 5-10 on each leg.             2. Marching: While keeping your pelvis still, lift the right foot a few inches, put it down then lift left foot. This will mimic a march. Start slow to establish control. Once you have control you may speed it up. Do 10-20x. You MUST keep your lower abdominlas contracted while you march. Breathe naturally              3. Single leg slides: Inhale while you slowly slide one leg out keeping your pelvis still. Only slide your leg as far as you can keep your pelvis still. Exhale as you bring the  leg back to the start, contracting the lower abdominals as you do that. Keep your upper body relaxed. Do 5-10 on each side.  Lower abdominal/core stability exercises  1. Practice your breathing technique: Inhale through your nose expanding your belly and rib cage. Try not to breathe into your chest. Exhale slowly and gradually out your mouth feeling a sense of softness to your body. Practice multiple times. This can be performed unlimited.  2. Finding the lower abdominals. Laying on your back with the knees bent, place your fingers just below your belly button. Using your breathing  technique from above, on your exhale gently pull the belly button away from your fingertips without tensing any other muscles. Practice this 5x. Next, as you exhale, draw belly button inwards and hold onto it...then feel as if you are pulling that muscle across your pelvis like you are tightening a belt. This can be hard to do at first so be patient and practice. Do 5-10 reps 1-3 x day. Always recognize quality over quantity; if your abdominal muscles become tired you will notice you may tighten/contract other muscles. This is the time to take a break.   Practice this first laying on your back, then in sitting, progressing to standing and finally adding it to all your daily movements.   3. Finding your pelvic floor. Using the breathing technique above, when your exhale, this time draw your pelvic floor muscles up as if you were attempting to stop the flow of urination. Be careful NOT to tense any other muscles. This can be hard, BE PATIENT. Try to hold up to 10 seconds repeating 10x. Try 2x a day. Once you feel you are doing this well, add this contraction to exercise #2. First contracting your pelvic floor followed by lower abdominals.   4. Adding leg movements. Add the following leg movements to challenge your ability to keep your core stable:             1. Single leg drop outs: Laying on your back with knees bent feet flat. Inhale,  dropping one knee outward KEEPING YOUR PELVIS STILL. Exhale as you bring the leg back, simultaneously performing your lower abdominal contraction. Do 5-10 on each leg.              2. Marching: While keeping your pelvis still, lift the right foot a few inches, put it down then lift left foot. This will mimic a march. Start slow to establish control. Once you have control you may speed it up. Do 10-20x. You MUST keep your lower abdominlas contracted while you march. Breathe naturally               3. Single leg slides: Inhale while you slowly slide one leg out keeping  your pelvis still. Only slide your leg as far as you can keep your pelvis still. Exhale as you bring the leg back to the start, contracting the lower abdominals as you do that. Keep your upper body relaxed. Do 5-10 on each side.      Walking: Phase 6    While walking, squeeze pelvic floor and hold. Walk for _8__ feet or _60__ seconds. Rest for _1 minnds. Repeat _3__ times. Do _3__ times a day.  Copyright  VHI. All rights reserved.  March in place for 3 min with pelvic floor contraction.   When you get up and down from truck then contract the pelvic floor.   Slow Contraction: Gravity Resisted (Standing)    Standing,  slowly squeeze pelvic floor for _20__ seconds. Rest for _10__ seconds. Repeat _5__ times. Do _3__ times a day.  Copyright  VHI. All rights reserved.  [facility] [address], [place], [place] [address] Phone # [phone] Fax [phone]

## 2017-04-03 NOTE — Therapy (Signed)
[facility] [address], [place], [place], [address] Phone: [phone]   Fax:  [phone]  Physical Therapy Treatment  Patient Details  Name: Benjamin [name] MRN: [id number] Date of Birth: [birth year] Referring Provider: Dr. Alexis Frock  Encounter Date: 04/03/2017      PT End of Session - 04/03/17 1608    Visit Number 19   Date for PT Re-Evaluation 05/12/17   PT Start Time 1530   PT Stop Time 1608   PT Time Calculation (min) 38 min   Activity Tolerance Patient tolerated treatment well   Behavior During Therapy [facility] for tasks assessed/performed      Past Medical History:  Diagnosis Date  . Acute prostatitis 07/03/12   s/p prostate   . Allergy   . Anxiety   . Arthritis    stenosis lower back  . Back pain   . BPH (benign prostatic hypertrophy) with urinary obstruction   . Depression   . ED (erectile dysfunction)   . Headache(784.0)    "sinus" headaches  . History of ITP    as a child per patient  . Hypercholesterolemia   . Other forms of epilepsy and recurrent seizures without mention of intractable epilepsy 09/16/2013  . Peripheral axonal neuropathy 08/06/2014  . Prostate cancer ([place]) 07/03/12   Adenocarcinoma,gleason=3+3=6,PSA=5.0,volume=20cc  . Seizures ([place])   . Unspecified hereditary and idiopathic peripheral neuropathy 07/07/2014   Foot numbness starting 2010, hands beginning in 2014- 15 .    Past Surgical History:  Procedure Laterality Date  . COLONOSCOPY    . KNEE ARTHROSCOPY  2017  . LUMBAR LAMINECTOMY/DECOMPRESSION MICRODISCECTOMY N/A 12/23/2013   Procedure: Lumbar Laminectomy Decompression, Lumbar Two, Three, Four, Five;  Surgeon: Hosie Spangle, MD;  Location: [facility];  Service: Neurosurgery;  Laterality: N/A;  . LYMPHADENECTOMY Bilateral 05/25/2016   Procedure: BILATERAL PELVIC LYMPHADENECTOMY;  Surgeon: Alexis Frock, MD;  Location: [facility];  Service: Urology;  Laterality: Bilateral;   . ROBOT ASSISTED LAPAROSCOPIC RADICAL PROSTATECTOMY N/A 05/25/2016   Procedure: XI ROBOTIC ASSISTED LAPAROSCOPIC RADICAL PROSTATECTOMY WITH INDOCYANINE GREEN DYE;  Surgeon: Alexis Frock, MD;  Location: [facility];  Service: Urology;  Laterality: N/A;  . TOE SURGERY      There were no vitals filed for this visit.      Subjective Assessment - 04/03/17 1535    Subjective No changes since last visit.    Pertinent History Prostate CA, surgery scheduled for 05/25/16.     Limitations Lifting;Standing;Walking   How long can you sit comfortably? soreness in pelvis   How long can you stand comfortably? no pain   How long can you walk comfortably? no pain   Diagnostic tests MRI prior to surgery.    Patient Stated Goals reduce urinary incontinence   Currently in Pain? No/denies            [facility] PT Assessment - 04/03/17 0001      Assessment   Medical Diagnosis prostate cancer   Referring Provider Dr. Alexis Frock   Onset Date/Surgical Date 05/25/16   Prior Therapy no     Precautions   Precautions Other (comment)   Precaution Comments cancer precautions     Restrictions   Weight Bearing Restrictions No     Balance Screen   Has the patient fallen in the past 6 months No   Has the patient had a decrease in activity level because of a fear of falling?  No   Is the patient reluctant to leave  their home because of a fear of falling?  No     Home Ecologist residence     Prior Function   Level of Independence Independent   Vocation Full time employment   Vocation Requirements mailman     Cognition   Overall Cognitive Status Within Functional Limits for tasks assessed     Posture/Postural Control   Posture/Postural Control Postural limitations   Postural Limitations Rounded Shoulders;Forward head;Posterior pelvic tilt     AROM   Lumbar Flexion full   Lumbar Extension decreased by 100%   Lumbar - Right Side Bend decreased by 25%   Lumbar - Left  Side Bend decreased by 25%     Strength   Overall Strength Comments left hip abduction 4/5 and right is 5/5; bil. 4/5                  Pelvic Floor Special Questions - 04/03/17 0001    Strength good squeeze, good lift, able to hold agaisnt strong resistance                   PT Education - 04/03/17 1607    Education provided Yes   Education Details reviewed past HEP and gave patient a new printout   Person(s) Educated Patient   Methods Explanation;Demonstration;Verbal cues;Handout   Comprehension Returned demonstration;Verbalized understanding          PT Short Term Goals - 05/03/16 1207      PT SHORT TERM GOAL #1   Title Pt will get full level 1-2 knee HEP for AROM, strength.    Time 1   Period Days   Status Achieved           PT Long Term Goals - 04/03/17 1536      PT LONG TERM GOAL #1   Title independent with HEP   Time 6   Period Months   Status Achieved     PT LONG TERM GOAL #2   Title urinary leakage from surgery decreased >/=75%   Time 6   Period Months   Status Not Met  50% better     PT LONG TERM GOAL #3   Title understand scar massage to keep mobility of tissue   Time 6   Period Months   Status Partially Met     PT LONG TERM GOAL #4   Title pelvic floor strength is 4/5 to reduce urinary leakage   Time 6   Period Months   Status Not Met     PT LONG TERM GOAL #5   Title reduction of wearing depends to 1 per day due to reduction in urinary incontinence   Time 4   Period Months   Status Not Met  2 pads per day               Plan - 04/03/17 1609    Clinical Impression Statement Patient urinary leakage decreased by 50%. Patient is not always consistent with his HEP.  Patient has long days and has trouble exercise.  Patient is going to the gym 3-4 times per week.  Patient is wearing 2 pads per day and sometimes will change his pad just incase.  Patient pelvic floor strength is 4/5.  Patient has some tightness in  the anterior pelvic floor muscles.  Patient will continue to exercise at home.    Rehab Potential Excellent   Clinical Impairments Affecting Rehab Potential prostate cancer surgery on 05/26/2016  PT Treatment/Interventions Biofeedback;Electrical Stimulation;Therapeutic activities;Therapeutic exercise;Neuromuscular re-education;Patient/family education;Passive range of motion;Manual techniques   PT Next Visit Plan Discharge to HEP   PT Home Exercise Plan Current HEP   Recommended Other Services cert signed on 03/17/3975 by MD   Consulted and Agree with Plan of Care Patient      Patient will benefit from skilled therapeutic intervention in order to improve the following deficits and impairments:  Impaired flexibility, Decreased strength, Decreased mobility, Pain, Decreased range of motion, Postural dysfunction, Decreased activity tolerance, Increased muscle spasms  Visit Diagnosis: Muscle weakness (generalized)  Aftercare following surgery for neoplasm     Problem List Patient Active Problem List   Diagnosis Date Noted  . Peripheral axonal neuropathy 08/06/2014  . Unspecified hereditary and idiopathic peripheral neuropathy 07/07/2014  . Lumbar stenosis with neurogenic claudication 12/23/2013  . Other forms of epilepsy and recurrent seizures without mention of intractable epilepsy 09/16/2013  . Prostate cancer [facility]) 07/03/2012    Earlie Counts, PT 04/03/17 4:13 PM    [facility] [address] [place], [place], [address] Phone: [phone]   Fax:  [phone]  Name: Benjamin [name] MRN: [id number] Date of Birth: 10-06-1951 PHYSICAL THERAPY DISCHARGE SUMMARY  Visits from Start of Care: 19  Current functional level related to goals / functional outcomes: See above.    Remaining deficits: See  Above. Urinary leakage is 50% better.    Education / Equipment: HEP  Plan: Patient agrees to discharge.  Patient goals  were partially met. Patient is being discharged due to                                                   reaching maximum progress at this time.  Thank you for the referral. Earlie Counts, PT 04/03/17 4:14 PM    ?????

## 2017-08-02 HISTORY — PX: COLONOSCOPY: SHX174

## 2017-08-15 ENCOUNTER — Encounter (INDEPENDENT_AMBULATORY_CARE_PROVIDER_SITE_OTHER): Payer: Self-pay

## 2017-08-15 ENCOUNTER — Ambulatory Visit (INDEPENDENT_AMBULATORY_CARE_PROVIDER_SITE_OTHER): Payer: 59 | Admitting: Neurology

## 2017-08-15 ENCOUNTER — Encounter: Payer: Self-pay | Admitting: Neurology

## 2017-08-15 VITALS — BP 119/78 | HR 66 | Ht 71.0 in | Wt 220.0 lb

## 2017-08-15 DIAGNOSIS — R4189 Other symptoms and signs involving cognitive functions and awareness: Secondary | ICD-10-CM | POA: Diagnosis not present

## 2017-08-15 DIAGNOSIS — R569 Unspecified convulsions: Secondary | ICD-10-CM | POA: Diagnosis not present

## 2017-08-15 DIAGNOSIS — C61 Malignant neoplasm of prostate: Secondary | ICD-10-CM | POA: Diagnosis not present

## 2017-08-15 MED ORDER — LEVETIRACETAM 750 MG PO TABS: 750.0000 mg | ORAL_TABLET | Freq: Two times a day (BID) | ORAL | 3 refills | Status: DC

## 2017-08-15 NOTE — Progress Notes (Signed)
PATIENT: Benjamin [name] DOB: [birth year]  REASON FOR VISIT: follow up- seizures HISTORY FROM: patient  HISTORY OF PRESENT ILLNESS:  08-15-2017, patient with incontinence and impotence after prostate cancer.He has spinal stenosis.  He has been for many years treated for seizures and had no recurrence for years. His medication needs to be refilled. He had repeatedly remarked upon cognitive changes and is to be tested for memory as well.     MM- [name] is a 66 year old male with a history of seizures. He returns today for follow-up. He is currently on Keppra 750 mg twice a day. He reports that he is tolerating this medication well. No additional seizures. 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 He operates a Teacher, music. He feels that his memory has remained stable. He states he continues to have a hard time with names but otherwise no additional challenges. He denies any new medical issues. He returns today for an evaluation.  HISTORY [name] is a 66 year old male with a history of seizures. He returns today for an evaluation. He is currently taking Keppra 750 mg twice a day. He is tolerating this medication well. He has not had any additional seizure events. He operates a Teacher, music without difficulty. He is able to complete all ADLs independent. He works as a Dispensing optician. The patient has noticed some difficulty with his memory. He states that he tends to forget names. Occasionally he will forget what he was going to do but he is eventually able to recall it. Denies any other symptoms regarding his memory. He returns today for an evaluation.  REVIEW OF SYSTEMS: Out of a complete 14 system review of symptoms, the patient complains only of the following symptoms, and all other reviewed systems are negative.  Incontinence of bladder, memory loss                               ALLERGIES: Allergies  Allergen Reactions  . Aspartame And Phenylalanine     Powder causes seizures  . Benadryl [Diphenhydramine]     seizures    HOME MEDICATIONS: Outpatient Medications Prior to Visit  Medication Sig Dispense Refill  . levETIRAcetam (KEPPRA) 750 MG tablet Take 1 tablet (750 mg total) by mouth every 12 (twelve) hours. 180 tablet 3  . sertraline (ZOLOFT) 100 MG tablet Take 100 mg by mouth daily as needed (when feeling down). Reported on 05/03/2016    . HYDROcodone-acetaminophen (NORCO) 5-325 MG tablet Take 1-2 tablets by mouth every 6 (six) hours as needed for moderate pain. 30 tablet 0   No facility-administered medications prior to visit.     PAST MEDICAL HISTORY: Past Medical History:  Diagnosis Date  . Acute prostatitis 07/03/12   s/p prostate   . Allergy   . Anxiety   . Arthritis    stenosis lower back  . Back pain   . BPH (benign prostatic hypertrophy) with urinary obstruction   . Depression   . ED (erectile dysfunction)   . Headache(784.0)    "sinus" headaches  . History of ITP    as a child per patient  . Hypercholesterolemia   . Other forms of epilepsy and recurrent seizures without mention of intractable epilepsy 09/16/2013  . Peripheral axonal neuropathy 08/06/2014  . Prostate cancer ([place]) 07/03/12   Adenocarcinoma,gleason=3+3=6,PSA=5.0,volume=20cc  . Seizures ([place])   . Unspecified hereditary and idiopathic peripheral neuropathy 07/07/2014  Foot numbness starting 2010, hands beginning in 2014- 15 .    PAST SURGICAL HISTORY: Past Surgical History:  Procedure Laterality Date  . COLONOSCOPY    . KNEE ARTHROSCOPY  2017  . LUMBAR LAMINECTOMY/DECOMPRESSION MICRODISCECTOMY N/A 12/23/2013   Procedure: Lumbar Laminectomy Decompression, Lumbar Two, Three, Four, Five;  Surgeon: Hosie Spangle, MD;  Location: [facility];  Service: Neurosurgery;  Laterality: N/A;  . LYMPHADENECTOMY Bilateral 05/25/2016   Procedure:  BILATERAL PELVIC LYMPHADENECTOMY;  Surgeon: Alexis Frock, MD;  Location: [facility];  Service: Urology;  Laterality: Bilateral;  . ROBOT ASSISTED LAPAROSCOPIC RADICAL PROSTATECTOMY N/A 05/25/2016   Procedure: XI ROBOTIC ASSISTED LAPAROSCOPIC RADICAL PROSTATECTOMY WITH INDOCYANINE GREEN DYE;  Surgeon: Alexis Frock, MD;  Location: [facility];  Service: Urology;  Laterality: N/A;  . TOE SURGERY      FAMILY HISTORY: Family History  Problem Relation Age of Onset  . Cancer Father 78       prostate/prostatectomy  . Cancer Cousin 32       prostate ca, also heart problems    SOCIAL HISTORY: Social History   Social History  . Marital status: Married    Spouse name: [name]  . Number of children: 1  . Years of education: Asso x 2   Occupational History  .  [employer]   Social History Main Topics  . Smoking status: Never Smoker  . Smokeless tobacco: Never Used  . Alcohol use Yes     Comment: social beer occasionally  . Drug use: No     Comment: chewed in high school short time  . Sexual activity: Yes   Other Topics Concern  . Not on file   Social History Narrative   Patient is married [name]) and lives at home with his wife and 1 child and a step-child.   Patient is working full-time.   Patient has two Associate degrees.   Patient is right-handed.   Patient drinks four cups of tea daily.      PHYSICAL EXAM  Vitals:   08/15/17 1308  BP: 119/78  Pulse: 66  Weight: 220 lb (99.8 kg)  Height: 5\' 11"  (1.803 m)   Body mass index is 30.68 kg/m.   Montreal Cognitive Assessment  08/15/2017  Visuospatial/ Executive (0/5) 5  Naming (0/3) 3  Attention: Read list of digits (0/2) 2  Attention: Read list of letters (0/1) 1  Attention: Serial 7 subtraction starting at 100 (0/3) 3  Language: Repeat phrase (0/2) 0  Language : Fluency (0/1) 0  Abstraction (0/2) 2  Delayed Recall (0/5) 3  Orientation (0/6) 6  Total 25     MMSE - Mini Mental State Exam 08/15/2016 08/10/2015    Orientation to time 5 5  Orientation to Place 5 5  Registration 3 3  Attention/ Calculation 5 5  Recall 3 3  Language- name 2 objects 2 2  Language- repeat 1 1  Language- follow 3 step command 3 2  Language- read & follow direction 1 1  Write a sentence 1 1  Copy design 1 1  Total score 30 29    Psoriatic changes lesions to both knees.  Generalized: Well developed, in no acute distress   Neurological examination  Mentation: Alert oriented to time, place, history taking. Follows all commands speech and language fluent Cranial nerve II-XII: no loss of taste and intact smell - Pupils were equal round reactive to light.  Extraocular movements were full, visual field were full on confrontational test. Facial sensation  and strength were normal. Uvula tongue midline.  Head turning and shoulder shrug  were normal and symmetric. Motor:  Loss of quadriceps mass bilaterally- equal grip strength. Poor hip extension.    Sensory: Coordination: Gait and station: Reflexes: Deep tendon reflexes are symmetrically attenuated bilaterally.   DIAGNOSTIC DATA (LABS, IMAGING, TESTING) - I reviewed patient records, labs, notes, testing and imaging myself where available.  Lab Results  Component Value Date   WBC 6.2 05/23/2016   HGB 13.2 05/26/2016   HCT 38.4 (L) 05/26/2016   MCV 88.5 05/23/2016   PLT 185 05/23/2016      Component Value Date/Time   NA 136 05/26/2016 0418   K 4.3 05/26/2016 0418   CL 105 05/26/2016 0418   CO2 25 05/26/2016 0418   GLUCOSE 199 (H) 05/26/2016 0418   BUN 14 05/26/2016 0418   CREATININE 0.98 05/26/2016 0418   CALCIUM 8.1 (L) 05/26/2016 0418   PROT 6.5 07/07/2014 0943   GFRNONAA >60 05/26/2016 0418   GFRAA >60 05/26/2016 0418       ASSESSMENT AND PLAN 66 y.o. year old male  has a past medical history of Acute prostatitis (07/03/12); Allergy; Anxiety; Arthritis; Back pain; BPH (benign prostatic hypertrophy) with urinary obstruction; Depression; ED (erectile  dysfunction); Headache(784.0); History of ITP; Hypercholesterolemia; Other forms of epilepsy and recurrent seizures without mention of intractable epilepsy (09/16/2013); Peripheral axonal neuropathy (08/06/2014); Prostate cancer ([place]) (07/03/12); Seizures ([place]); and Unspecified hereditary and idiopathic peripheral neuropathy (07/07/2014). here with:  1. Seizures- refilled medication  2. Memory disturbance- MOCA 25/30 points   Overall the patient is doing well. He will continue on Keppra 750 mg twice a day.  His memory score has remained stable- MOCA reveals mild Cognitive impairment.. We will continue to monitor yearly.  If patient's symptoms worsen or he develops any new symptoms he should let us know.   He will follow-up in 1 year or sooner if needed.     Larey Seat, MD  08/15/2017, 2:00 PM [facility] [address], [place], [place] [address] [phone]

## 2017-08-15 NOTE — Patient Instructions (Signed)

## 2018-01-29 ENCOUNTER — Other Ambulatory Visit: Payer: Self-pay | Admitting: Physician Assistant

## 2018-01-29 DIAGNOSIS — M8580 Other specified disorders of bone density and structure, unspecified site: Secondary | ICD-10-CM

## 2018-02-21 ENCOUNTER — Other Ambulatory Visit: Payer: 59

## 2018-02-24 ENCOUNTER — Other Ambulatory Visit: Payer: 59

## 2018-06-04 ENCOUNTER — Ambulatory Visit
Admission: RE | Admit: 2018-06-04 | Discharge: 2018-06-04 | Disposition: A | Discharge status: Home / Self Care | Payer: 59 | Source: Ambulatory Visit | Attending: Physician Assistant | Admitting: Physician Assistant

## 2018-06-04 DIAGNOSIS — M8580 Other specified disorders of bone density and structure, unspecified site: Secondary | ICD-10-CM

## 2018-08-18 ENCOUNTER — Ambulatory Visit (INDEPENDENT_AMBULATORY_CARE_PROVIDER_SITE_OTHER): Payer: 59 | Admitting: Neurology

## 2018-08-18 ENCOUNTER — Encounter: Payer: Self-pay | Admitting: Neurology

## 2018-08-18 VITALS — BP 131/89 | HR 66 | Ht 70.0 in | Wt 228.0 lb

## 2018-08-18 DIAGNOSIS — L4050 Arthropathic psoriasis, unspecified: Secondary | ICD-10-CM | POA: Insufficient documentation

## 2018-08-18 DIAGNOSIS — G40209 Localization-related (focal) (partial) symptomatic epilepsy and epileptic syndromes with complex partial seizures, not intractable, without status epilepticus: Secondary | ICD-10-CM | POA: Insufficient documentation

## 2018-08-18 DIAGNOSIS — F5104 Psychophysiologic insomnia: Secondary | ICD-10-CM | POA: Diagnosis not present

## 2018-08-18 NOTE — Progress Notes (Signed)
PATIENT: Benjamin [name] DOB: [birth year]  REASON FOR VISIT: follow up- seizures HISTORY FROM: patient  HISTORY OF PRESENT ILLNESS:  08-18-2018, RV yearly MOCA 24/ 30 points, possibly 2 nocturnal seizures. He has had a HST through PCP , after his wife reported him snoring.  HST attempted twice, each night some emergency came up. Psoriasis. [facility]. Mind racing. Worried often, some contributing to insomnia. He is finally retiring this year, stressful environment.    08-15-2017, patient with incontinence and impotence after prostate cancer.He has spinal stenosis. He has been for many years treated for seizures and had no recurrence for years. His medication needs to be refilled. He had repeatedly remarked upon cognitive changes and is to be tested for memory as well.    MM- Benjamin [name] is a 67 year old male with a history of seizures. He returns today for follow-up. He is currently on Keppra 750 mg twice a day. He reports that he is tolerating this medication well. No additional seizures. 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 He operates a Teacher, music. He feels that his memory has remained stable. He states he continues to have a hard time with names but otherwise no additional challenges. He denies any new medical issues. He returns today for an evaluation.  HISTORY Benjamin [name] is a 67 year old male with a history of seizures. He returns today for an evaluation. He is currently taking Keppra 750 mg twice a day. He is tolerating this medication well. He has not had any additional seizure events. He operates a Teacher, music without difficulty. He is able to complete all ADLs independent. He works as a Dispensing optician. The patient has noticed some difficulty with his memory. He states that he tends to forget names. Occasionally he will forget what he was going to do but he is eventually able to recall it. Denies any other symptoms regarding his memory. He returns today for  an evaluation.  REVIEW OF SYSTEMS: Out of a complete 14 system review of symptoms, the patient complains only of the following symptoms, and all other reviewed systems are negative.  Incontinence of bladder, memory loss, sleep walking- has urinated in the bathroom garbage can.  Stopped his Zoloft "stress medication" and it hasn't happened since. Was this a seizure. Forgets names. .                                                                                                                                           ALLERGIES: Allergies  Allergen Reactions  . Aspartame And Phenylalanine     Powder causes seizures  . Benadryl [Diphenhydramine]     seizures    HOME MEDICATIONS: Outpatient Medications Prior to Visit  Medication Sig Dispense Refill  . levETIRAcetam (KEPPRA) 750 MG tablet Take 1 tablet (750 mg total) by mouth every 12 (twelve) hours. 180 tablet 3  .  sertraline (ZOLOFT) 100 MG tablet Take 100 mg by mouth daily as needed (when feeling down). Reported on 05/03/2016     No facility-administered medications prior to visit.     PAST MEDICAL HISTORY: Past Medical History:  Diagnosis Date  . Acute prostatitis 07/03/12   s/p prostate   . Allergy   . Anxiety   . Arthritis    stenosis lower back  . Back pain   . BPH (benign prostatic hypertrophy) with urinary obstruction   . Depression   . ED (erectile dysfunction)   . Headache(784.0)    "sinus" headaches  . History of ITP    as a child per patient  . Hypercholesterolemia   . Other forms of epilepsy and recurrent seizures without mention of intractable epilepsy 09/16/2013  . Peripheral axonal neuropathy 08/06/2014  . Prostate cancer ([place]) 07/03/12   Adenocarcinoma,gleason=3+3=6,PSA=5.0,volume=20cc  . Seizures ([place])   . Unspecified hereditary and idiopathic peripheral neuropathy 07/07/2014   Foot numbness starting 2010, hands beginning in 2014- 15 .    PAST SURGICAL HISTORY: Past Surgical History:  Procedure  Laterality Date  . COLONOSCOPY    . KNEE ARTHROSCOPY  2017  . LUMBAR LAMINECTOMY/DECOMPRESSION MICRODISCECTOMY N/A 12/23/2013   Procedure: Lumbar Laminectomy Decompression, Lumbar Two, Three, Four, Five;  Surgeon: Hosie Spangle, MD;  Location: [facility];  Service: Neurosurgery;  Laterality: N/A;  . LYMPHADENECTOMY Bilateral 05/25/2016   Procedure: BILATERAL PELVIC LYMPHADENECTOMY;  Surgeon: Alexis Frock, MD;  Location: [facility];  Service: Urology;  Laterality: Bilateral;  . ROBOT ASSISTED LAPAROSCOPIC RADICAL PROSTATECTOMY N/A 05/25/2016   Procedure: XI ROBOTIC ASSISTED LAPAROSCOPIC RADICAL PROSTATECTOMY WITH INDOCYANINE GREEN DYE;  Surgeon: Alexis Frock, MD;  Location: [facility];  Service: Urology;  Laterality: N/A;  . TOE SURGERY      FAMILY HISTORY: Family History  Problem Relation Age of Onset  . Cancer Father 23       prostate/prostatectomy  . Cancer Cousin 79       prostate ca, also heart problems    SOCIAL HISTORY: Social History   Socioeconomic History  . Marital status: Married    Spouse name: [name]  . Number of children: 1  . Years of education: Asso x 2  . Highest education level: Not on file  Occupational History    Employer: [employer]  Social Needs  . Financial resource strain: Not on file  . Food insecurity:    Worry: Not on file    Inability: Not on file  . Transportation needs:    Medical: Not on file    Non-medical: Not on file  Tobacco Use  . Smoking status: Never Smoker  . Smokeless tobacco: Never Used  Substance and Sexual Activity  . Alcohol use: Yes    Comment: social beer occasionally  . Drug use: No    Comment: chewed in high school short time  . Sexual activity: Yes  Lifestyle  . Physical activity:    Days per week: Not on file    Minutes per session: Not on file  . Stress: Not on file  Relationships  . Social connections:    Talks on phone: Not on file    Gets together: Not on file    Attends religious service: Not on file     Active member of club or organization: Not on file    Attends meetings of clubs or organizations: Not on file    Relationship status: Not on file  . Intimate partner violence:  Fear of current or ex partner: Not on file    Emotionally abused: Not on file    Physically abused: Not on file    Forced sexual activity: Not on file  Other Topics Concern  . Not on file  Social History Narrative   Patient is married [name]) and lives at home with his wife and 1 child and a step-child.   Patient is working full-time.   Patient has two Associate degrees.   Patient is right-handed.   Patient drinks four cups of tea daily.      PHYSICAL EXAM  Vitals:   08/18/18 1557  BP: 131/89  Pulse: 66  Weight: 228 lb (103.4 kg)  Height: 5\' 10"  (1.778 m)   Body mass index is 32.71 kg/m.   Montreal Cognitive Assessment  08/18/2018 08/18/2018 08/15/2017  Visuospatial/ Executive (0/5) 4 - 5  Naming (0/3) 3 - 3  Attention: Read list of digits (0/2) 2 - 2  Attention: Read list of letters (0/1) 1 - 1  Attention: Serial 7 subtraction starting at 100 (0/3) 3 - 3  Language: Repeat phrase (0/2) 1 - 0  Language : Fluency (0/1) 0 - 0  Abstraction (0/2) 2 - 2  Delayed Recall (0/5) 3 - 3  Orientation (0/6) 5 5 6   Total 24 - 25     MMSE - Mini Mental State Exam 08/15/2016 08/10/2015  Orientation to time 5 5  Orientation to Place 5 5  Registration 3 3  Attention/ Calculation 5 5  Recall 3 3  Language- name 2 objects 2 2  Language- repeat 1 1  Language- follow 3 step command 3 2  Language- read & follow direction 1 1  Write a sentence 1 1  Copy design 1 1  Total score 30 29    Psoriatic  lesions to both knees and elbows.   Generalized: Well developed, in no acute distress - he appears very tired.   Neurological examination  Mentation: Alert oriented to time, place, history taking. Follows all commands speech and language fluent Cranial nerve  no loss of taste and intact smell - Pupils were  equal round reactive to light.  Extraocular movements were full, visual field were full on confrontational test.  Facial sensation and strength were normal.  Uvula and tongue midline.  Head turning and shoulder shrug  were normal and symmetric. Motor:  Loss of quadriceps mass bilaterally- equal grip strength.  Poor hip extension.    Sensory: Coordination: Gait and station: Reflexes:  Deep tendon reflexes are symmetrically attenuated bilaterally.    DIAGNOSTIC DATA (LABS, IMAGING, TESTING) - I reviewed patient records, labs, notes, testing and imaging myself where available.  Lab Results  Component Value Date   WBC 6.2 05/23/2016   HGB 13.2 05/26/2016   HCT 38.4 (L) 05/26/2016   MCV 88.5 05/23/2016   PLT 185 05/23/2016      Component Value Date/Time   NA 136 05/26/2016 0418   K 4.3 05/26/2016 0418   CL 105 05/26/2016 0418   CO2 25 05/26/2016 0418   GLUCOSE 199 (H) 05/26/2016 0418   BUN 14 05/26/2016 0418   CREATININE 0.98 05/26/2016 0418   CALCIUM 8.1 (L) 05/26/2016 0418   PROT 6.5 07/07/2014 0943   GFRNONAA >60 05/26/2016 0418   GFRAA >60 05/26/2016 0418       ASSESSMENT AND PLAN :  67 y.o. year old male  has a past medical history of Acute prostatitis (07/03/12), Allergy, Anxiety, Arthritis, Back pain, BPH (benign  prostatic hypertrophy) with urinary obstruction, Depression, ED (erectile dysfunction), Headache(784.0), History of ITP, Hypercholesterolemia, Other forms of epilepsy and recurrent seizures without mention of intractable epilepsy (09/16/2013), Peripheral axonal neuropathy (08/06/2014), Prostate cancer ([place]) (07/03/12), Seizures ([place]), and Unspecified hereditary and idiopathic peripheral neuropathy (07/07/2014). here with:  1. Seizures- refilled medication - he had 2 nocturnal seizures , each after a forgetting his medication, one after some alcohol.  2. Memory disturbance- MOCA 24/30 points , on less than last visit. He is speaking fluently.  3. 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  I hope having less stress and retirement will help his anxiety/ insomnia.   Overall the patient is doing well. He will continue on Keppra 750 mg twice a day.  His memory score has remained stable- MOCA reveals mild Cognitive impairment..  We will continue to monitor yearly. I offered an attended sleep study- he declined.     He will follow-up with NP  in 6 month or sooner if needed.   Larey Seat, MD  08/18/2018, 4:37 PM [facility] [address] [place], [place] [address] [phone]

## 2018-08-18 NOTE — Patient Instructions (Signed)
Please remember to try to maintain good sleep hygiene, which means: Keep a regular sleep and wake schedule, try not to exercise or have a meal within 2 hours of your bedtime, try to keep your bedroom conducive for sleep, that is, cool and dark, without light distractors such as an illuminated alarm clock, and refrain from watching TV right before sleep or in the middle of the night and do not keep the TV or radio on during the night. Also, try not to use or play on electronic devices at bedtime, such as your cell phone, tablet PC or laptop. If you like to read at bedtime on an electronic device, try to dim the background light as much as possible. Do not eat in the middle of the night.   We will offer you  a sleep study.    We will look for leg twitching, seizures, and snoring / sleep apnea.   For chronic insomnia, you are best followed by a psychiatrist and/or sleep psychologist.   Please call us and make a sleep  appointment if needed.

## 2018-09-22 ENCOUNTER — Other Ambulatory Visit: Payer: Self-pay | Admitting: Neurology

## 2018-09-22 DIAGNOSIS — R569 Unspecified convulsions: Secondary | ICD-10-CM

## 2018-09-24 ENCOUNTER — Other Ambulatory Visit: Payer: Self-pay | Admitting: Neurology

## 2018-09-24 DIAGNOSIS — R569 Unspecified convulsions: Secondary | ICD-10-CM

## 2018-10-06 ENCOUNTER — Telehealth: Payer: Self-pay | Admitting: Neurology

## 2018-10-06 NOTE — Telephone Encounter (Signed)
Script was sent in for refill on 09/22/18 to that pharmacy on file. The pharmacy should have it ready for the patient.

## 2018-10-06 NOTE — Telephone Encounter (Signed)
Pt calling for a refill prescription for his levETIRAcetam (KEPPRA) 750 MG tablet [id number]

## 2018-10-20 ENCOUNTER — Other Ambulatory Visit: Payer: Self-pay | Admitting: Neurology

## 2018-10-20 ENCOUNTER — Telehealth: Payer: Self-pay | Admitting: Neurology

## 2018-10-20 DIAGNOSIS — R569 Unspecified convulsions: Secondary | ICD-10-CM

## 2018-10-20 MED ORDER — LEVETIRACETAM 750 MG PO TABS: 750.0000 mg | ORAL_TABLET | Freq: Two times a day (BID) | ORAL | 3 refills | Status: DC

## 2018-10-20 NOTE — Telephone Encounter (Signed)
Sent the refill in. It was sent on 11/11 but never went through to pharmacy. Refills sent to pharmacy and states it was confirmed by pharmacy at 12:01 today

## 2018-10-20 NOTE — Telephone Encounter (Signed)
Pt is asking for a refill on his levETIRAcetam (KEPPRA) 750 MG tablet [id number]

## 2018-12-08 ENCOUNTER — Encounter (HOSPITAL_COMMUNITY): Payer: Self-pay | Admitting: *Deleted

## 2018-12-08 ENCOUNTER — Emergency Department (HOSPITAL_COMMUNITY): Payer: 59

## 2018-12-08 ENCOUNTER — Emergency Department (HOSPITAL_COMMUNITY)
Admission: EM | Admit: 2018-12-08 | Discharge: 2018-12-08 | Disposition: A | Discharge status: Home / Self Care | Payer: 59 | Attending: Emergency Medicine | Admitting: Emergency Medicine

## 2018-12-08 ENCOUNTER — Other Ambulatory Visit: Payer: Self-pay

## 2018-12-08 DIAGNOSIS — S3992XA Unspecified injury of lower back, initial encounter: Secondary | ICD-10-CM | POA: Diagnosis present

## 2018-12-08 DIAGNOSIS — Y999 Unspecified external cause status: Secondary | ICD-10-CM | POA: Insufficient documentation

## 2018-12-08 DIAGNOSIS — S32050A Wedge compression fracture of fifth lumbar vertebra, initial encounter for closed fracture: Secondary | ICD-10-CM

## 2018-12-08 DIAGNOSIS — Z79899 Other long term (current) drug therapy: Secondary | ICD-10-CM | POA: Diagnosis not present

## 2018-12-08 DIAGNOSIS — Y9389 Activity, other specified: Secondary | ICD-10-CM | POA: Insufficient documentation

## 2018-12-08 DIAGNOSIS — R079 Chest pain, unspecified: Secondary | ICD-10-CM | POA: Insufficient documentation

## 2018-12-08 DIAGNOSIS — R569 Unspecified convulsions: Secondary | ICD-10-CM

## 2018-12-08 DIAGNOSIS — Y92410 Unspecified street and highway as the place of occurrence of the external cause: Secondary | ICD-10-CM | POA: Diagnosis not present

## 2018-12-08 DIAGNOSIS — R55 Syncope and collapse: Secondary | ICD-10-CM | POA: Diagnosis not present

## 2018-12-08 DIAGNOSIS — G40909 Epilepsy, unspecified, not intractable, without status epilepticus: Secondary | ICD-10-CM | POA: Diagnosis not present

## 2018-12-08 LAB — URINALYSIS, ROUTINE W REFLEX MICROSCOPIC
BACTERIA UA: NONE SEEN
Bilirubin Urine: NEGATIVE
Glucose, UA: 150 mg/dL — AB
Ketones, ur: NEGATIVE mg/dL
Leukocytes, UA: NEGATIVE
Nitrite: NEGATIVE
Protein, ur: NEGATIVE mg/dL
Specific Gravity, Urine: 1.016 (ref 1.005–1.030)
pH: 5 (ref 5.0–8.0)

## 2018-12-08 LAB — COMPREHENSIVE METABOLIC PANEL
ALT: 24 U/L (ref 0–44)
AST: 29 U/L (ref 15–41)
Albumin: 3.7 g/dL (ref 3.5–5.0)
Alkaline Phosphatase: 54 U/L (ref 38–126)
Anion gap: 10 (ref 5–15)
BUN: 11 mg/dL (ref 8–23)
CO2: 21 mmol/L — ABNORMAL LOW (ref 22–32)
CREATININE: 1.28 mg/dL — AB (ref 0.61–1.24)
Calcium: 8.5 mg/dL — ABNORMAL LOW (ref 8.9–10.3)
Chloride: 105 mmol/L (ref 98–111)
GFR calc Af Amer: >60 mL/min (ref >60)
GFR calc non Af Amer: 58 mL/min — ABNORMAL LOW (ref >60)
Glucose, Bld: 165 mg/dL — ABNORMAL HIGH (ref 70–99)
Potassium: 4.6 mmol/L (ref 3.5–5.1)
SODIUM: 136 mmol/L (ref 135–145)
Total Bilirubin: 0.6 mg/dL (ref 0.3–1.2)
Total Protein: 6.3 g/dL — ABNORMAL LOW (ref 6.5–8.1)

## 2018-12-08 LAB — SAMPLE TO BLOOD BANK

## 2018-12-08 LAB — CBC
HEMATOCRIT: 44.7 % (ref 39.0–52.0)
Hemoglobin: 15.4 g/dL (ref 13.0–17.0)
MCH: 30.7 pg (ref 26.0–34.0)
MCHC: 34.5 g/dL (ref 30.0–36.0)
MCV: 89 fL (ref 80.0–100.0)
Platelets: 173 10*3/uL (ref 150–400)
RBC: 5.02 MIL/uL (ref 4.22–5.81)
RDW: 12.3 % (ref 11.5–15.5)
WBC: 7.6 10*3/uL (ref 4.0–10.5)
nRBC: 0 % (ref 0.0–0.2)

## 2018-12-08 LAB — LACTIC ACID, PLASMA: Lactic Acid, Venous: 4.4 mmol/L (ref 0.5–1.9)

## 2018-12-08 LAB — CDS SEROLOGY

## 2018-12-08 LAB — PROTIME-INR
INR: 1.22
PROTHROMBIN TIME: 15.3 s — AB (ref 11.4–15.2)

## 2018-12-08 LAB — ETHANOL: Alcohol, Ethyl (B): <10 mg/dL (ref <10)

## 2018-12-08 MED ORDER — OXYCODONE HCL 5 MG PO TABS: 5.0000 mg | ORAL_TABLET | ORAL | 0 refills | Status: DC | PRN

## 2018-12-08 MED ORDER — CYCLOBENZAPRINE HCL 10 MG PO TABS: 10.0000 mg | ORAL_TABLET | Freq: Two times a day (BID) | ORAL | 0 refills | Status: DC | PRN

## 2018-12-08 MED ORDER — IOHEXOL 300 MG/ML  SOLN
100.0000 mL | Freq: Once | INTRAMUSCULAR | Status: AC | PRN
  Administered 2018-12-08: 100 mL via INTRAVENOUS

## 2018-12-08 MED ORDER — SODIUM CHLORIDE 0.9 % IV BOLUS
1000.0000 mL | Freq: Once | INTRAVENOUS | Status: AC
  Administered 2018-12-08: 1000 mL via INTRAVENOUS

## 2018-12-08 MED ORDER — LEVETIRACETAM IN NACL 1000 MG/100ML IV SOLN
1000.0000 mg | Freq: Once | INTRAVENOUS | Status: AC
  Administered 2018-12-08: 1000 mg via INTRAVENOUS
  Filled 2018-12-08: qty 100

## 2018-12-08 NOTE — ED Notes (Signed)
Patient transported to CT 

## 2018-12-08 NOTE — ED Notes (Signed)
Discharge instructions (including medications) discussed with and copy provided to patient/caregiver.  Pt verbalizes understanding of d/c instructions including all follow-up appointments.
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
## 2018-12-08 NOTE — Discharge Instructions (Signed)
Take Tylenol 1000 mg 4 times a day for 1 week. This is the maximum dose of Tylenol (acetaminophen) you can take from all sources. Please check other over-the-counter medications and prescriptions to ensure you are not taking other medications that contain acetaminophen.  You may also take ibuprofen 400 mg 6 times a day alternating with or at the same time as tylenol.  Take oxycodone as needed for breakthrough pain.  This medication can be addicting, sedating and cause constipation.   °

## 2018-12-16 DIAGNOSIS — G40909 Epilepsy, unspecified, not intractable, without status epilepticus: Secondary | ICD-10-CM | POA: Diagnosis not present

## 2018-12-16 DIAGNOSIS — M810 Age-related osteoporosis without current pathological fracture: Secondary | ICD-10-CM | POA: Diagnosis not present

## 2018-12-16 DIAGNOSIS — S32059A Unspecified fracture of fifth lumbar vertebra, initial encounter for closed fracture: Secondary | ICD-10-CM | POA: Diagnosis not present

## 2018-12-16 DIAGNOSIS — Z79899 Other long term (current) drug therapy: Secondary | ICD-10-CM | POA: Diagnosis not present

## 2018-12-17 ENCOUNTER — Telehealth: Payer: Self-pay | Admitting: Neurology

## 2018-12-17 ENCOUNTER — Other Ambulatory Visit: Payer: Self-pay | Admitting: Neurology

## 2018-12-17 DIAGNOSIS — R4189 Other symptoms and signs involving cognitive functions and awareness: Secondary | ICD-10-CM

## 2018-12-17 DIAGNOSIS — G40909 Epilepsy, unspecified, not intractable, without status epilepticus: Secondary | ICD-10-CM

## 2018-12-17 DIAGNOSIS — R569 Unspecified convulsions: Secondary | ICD-10-CM

## 2018-12-17 MED ORDER — LEVETIRACETAM ER 500 MG PO TB24: ORAL_TABLET | ORAL | 5 refills | Status: DC
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
## 2018-12-17 NOTE — Addendum Note (Signed)
Addended by: Larey Seat on: 12/17/2018 04:06 PM   Modules accepted: Orders

## 2018-12-17 NOTE — Telephone Encounter (Signed)
Pt states he had a seizure on Monday and he'd like a call to discuss with RN

## 2018-12-17 NOTE — Telephone Encounter (Signed)
Called the patient to advise him of Dr. Edwena Felty plans. Advised the patient that he will need to come in and complete EEG, MRI of head and we will make a change to his Keppra medication to extended release. Pt verbalized understanding. Patient will complete this hopefully before apt in march.

## 2018-12-17 NOTE — Telephone Encounter (Signed)
EEG order, we need to lok at keppra levels can increase Keppra to 100 mg bid.

## 2018-12-17 NOTE — Telephone Encounter (Signed)
I changed Keppra to 500mg  tab 24 hour release and he will take 3 at bedtime-

## 2018-12-18 ENCOUNTER — Telehealth: Payer: Self-pay | Admitting: Neurology

## 2018-12-18 NOTE — Telephone Encounter (Signed)
[employer] order sent to GI. They obtain the auth and will reach out to the pt to schedule.  °

## 2018-12-22 DIAGNOSIS — M5442 Lumbago with sciatica, left side: Secondary | ICD-10-CM | POA: Diagnosis not present

## 2018-12-22 DIAGNOSIS — S32050A Wedge compression fracture of fifth lumbar vertebra, initial encounter for closed fracture: Secondary | ICD-10-CM | POA: Diagnosis not present

## 2018-12-22 DIAGNOSIS — Z6831 Body mass index (BMI) 31.0-31.9, adult: Secondary | ICD-10-CM | POA: Diagnosis not present

## 2018-12-22 DIAGNOSIS — M549 Dorsalgia, unspecified: Secondary | ICD-10-CM | POA: Diagnosis not present

## 2018-12-24 NOTE — Telephone Encounter (Signed)
Benjamin [name]: [id number] (exp. 12/23/18 to 03/23/19) patient is scheduled at GI for 12/27/18

## 2018-12-27 ENCOUNTER — Ambulatory Visit
Admission: RE | Admit: 2018-12-27 | Discharge: 2018-12-27 | Disposition: A | Discharge status: Home / Self Care | Payer: PRIVATE HEALTH INSURANCE | Source: Ambulatory Visit | Attending: Neurology | Admitting: Neurology

## 2018-12-27 DIAGNOSIS — R4189 Other symptoms and signs involving cognitive functions and awareness: Secondary | ICD-10-CM

## 2018-12-27 DIAGNOSIS — R569 Unspecified convulsions: Secondary | ICD-10-CM | POA: Diagnosis not present

## 2018-12-27 MED ORDER — GADOBENATE DIMEGLUMINE 529 MG/ML IV SOLN
20.0000 mL | Freq: Once | INTRAVENOUS | Status: AC | PRN
  Administered 2018-12-27: 20 mL via INTRAVENOUS

## 2018-12-29 ENCOUNTER — Telehealth: Payer: Self-pay | Admitting: Neurology

## 2018-12-29 NOTE — Telephone Encounter (Signed)
-----   Message from Larey Seat, MD sent at 12/29/2018  8:23 AM EST ----- IMPRESSION:   MRI brain (with and without) demonstrating: - Few punctate periventricular and subcortical foci of non-specific gliosis.  - No acute findings.

## 2018-12-29 NOTE — Telephone Encounter (Signed)
Called the patient and made him aware the MRI of the brain results didn't show anything of clinical concern. No acute findings. Pt verbalized understanding. Pt had no questions at this time but was encouraged to call back if questions arise.

## 2019-01-20 DIAGNOSIS — Z6831 Body mass index (BMI) 31.0-31.9, adult: Secondary | ICD-10-CM | POA: Diagnosis not present

## 2019-01-20 DIAGNOSIS — K219 Gastro-esophageal reflux disease without esophagitis: Secondary | ICD-10-CM | POA: Diagnosis not present

## 2019-01-20 DIAGNOSIS — R1310 Dysphagia, unspecified: Secondary | ICD-10-CM | POA: Diagnosis not present

## 2019-01-20 DIAGNOSIS — G40909 Epilepsy, unspecified, not intractable, without status epilepticus: Secondary | ICD-10-CM | POA: Diagnosis not present

## 2019-01-26 ENCOUNTER — Other Ambulatory Visit: Payer: Self-pay

## 2019-01-26 ENCOUNTER — Ambulatory Visit (INDEPENDENT_AMBULATORY_CARE_PROVIDER_SITE_OTHER): Payer: Medicare Other | Admitting: Neurology

## 2019-01-26 DIAGNOSIS — G40909 Epilepsy, unspecified, not intractable, without status epilepticus: Secondary | ICD-10-CM

## 2019-01-27 ENCOUNTER — Encounter: Payer: Self-pay | Admitting: Neurology

## 2019-01-27 ENCOUNTER — Ambulatory Visit (INDEPENDENT_AMBULATORY_CARE_PROVIDER_SITE_OTHER): Payer: Medicare Other | Admitting: Neurology

## 2019-01-27 ENCOUNTER — Other Ambulatory Visit: Payer: Self-pay

## 2019-01-27 VITALS — BP 143/89 | HR 77 | Temp 97.3°F | Ht 71.0 in | Wt 227.0 lb

## 2019-01-27 DIAGNOSIS — R569 Unspecified convulsions: Secondary | ICD-10-CM

## 2019-01-27 DIAGNOSIS — G40909 Epilepsy, unspecified, not intractable, without status epilepticus: Secondary | ICD-10-CM

## 2019-01-27 MED ORDER — LEVETIRACETAM ER 500 MG PO TB24: ORAL_TABLET | ORAL | 5 refills | Status: DC

## 2019-01-27 MED ORDER — ALPRAZOLAM 1 MG PO TABS: ORAL_TABLET | ORAL | 0 refills | Status: DC
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
## 2019-01-27 NOTE — Procedures (Deleted)
EEG study for duration of 31 minutes 22 seconds , recorded on  01-26-2019.  This is a complete EEG study including the hyperventilation and photic stimulation maneuvers.  A posterior dominant rhythm of 10 Hz was established under eye closure and promptly attenuated with eye opening.  Following 2 minutes the patient began with hyperventilation maneuvers.  The amplitude increased, but the rhythm slowed to 8 and 9 Hz.  There were no epileptiform discharges noted.  Photic stimulation followed after another 2 minutes at rest.  There was entrainment of the posterior dominant rhythm at all tested frequencies.  There were no epileptiform discharges noted here either.  The heart rate stayed between 80 and 88 bpm in normal sinus rhythm.   After photic stimulation concluded, the patient was allowed to rest and actually fell asleep.   Drowsiness was indicated at the main EEG frequency became 8 and finally 6 Hz, and vertex sharps waves.   Stage II sleep was reached with notable K complex formations which all appeared symmetrically.  This is a normal EEG for the patient's age and conscious state.  Larey Seat, MD

## 2019-01-27 NOTE — Procedures (Signed)
Benjamin [name], [name], MD  Physician  Neurology  Procedures  Sign when Signing Visit  Creation Time:  01/27/2019 9:04 AM        Procedure Orders  EEG adult [[id number]] ordered by Larey Seat, MD at 12/17/18 1605  Pre-procedure Diagnoses  Seizure disorder ([place]) [B16.945]      Sign when Signing Visit        EEG study for duration of 31 minutes 22 seconds , recorded on  01-26-2019.  This is a complete EEG study including the hyperventilation and photic stimulation maneuvers.  A posterior dominant rhythm of 10 Hz was established under eye closure and promptly attenuated with eye opening.  Following 2 minutes the patient began with hyperventilation maneuvers.  The amplitude increased, but the rhythm slowed to 8 and 9 Hz.  There were no epileptiform discharges noted.  Photic stimulation followed after another 2 minutes at rest.  There was entrainment of the posterior dominant rhythm at all tested frequencies.  There were no epileptiform discharges noted here either.  The heart rate stayed between 80 and 88 bpm in normal sinus rhythm.   After photic stimulation concluded, the patient was allowed to rest and actually fell asleep.   Drowsiness was indicated at the main EEG frequency became 8 and finally 6 Hz, and vertex sharps waves.   Stage II sleep was reached with notable K complex formations which all appeared symmetrically.  This is a normal EEG for the patient's age and conscious state.  Larey Seat, MD

## 2019-02-03 DIAGNOSIS — M5136 Other intervertebral disc degeneration, lumbar region: Secondary | ICD-10-CM | POA: Diagnosis not present

## 2019-02-03 DIAGNOSIS — S32050A Wedge compression fracture of fifth lumbar vertebra, initial encounter for closed fracture: Secondary | ICD-10-CM | POA: Diagnosis not present

## 2019-02-03 DIAGNOSIS — M545 Low back pain: Secondary | ICD-10-CM | POA: Diagnosis not present

## 2019-02-03 DIAGNOSIS — M47816 Spondylosis without myelopathy or radiculopathy, lumbar region: Secondary | ICD-10-CM | POA: Diagnosis not present

## 2019-02-09 DIAGNOSIS — S32050A Wedge compression fracture of fifth lumbar vertebra, initial encounter for closed fracture: Secondary | ICD-10-CM | POA: Diagnosis not present

## 2019-02-17 ENCOUNTER — Telehealth: Payer: Self-pay | Admitting: Neurology

## 2019-02-17 NOTE — Telephone Encounter (Signed)
Called patient to schedule a follow-up for early July with Amy NP. No answer, but I LVM requesting patient call us back to schedule an appointment.
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
## 2019-02-18 NOTE — Telephone Encounter (Signed)
Patient called back and we scheduled an appointment for July 1st with Amy NP to determine if he will be able to drive.

## 2019-02-19 ENCOUNTER — Ambulatory Visit: Payer: 59 | Admitting: Neurology

## 2019-02-20 ENCOUNTER — Other Ambulatory Visit: Payer: Self-pay | Admitting: Neurosurgery

## 2019-02-20 DIAGNOSIS — S32050A Wedge compression fracture of fifth lumbar vertebra, initial encounter for closed fracture: Secondary | ICD-10-CM

## 2019-02-25 ENCOUNTER — Other Ambulatory Visit: Payer: Self-pay

## 2019-02-25 ENCOUNTER — Ambulatory Visit
Admission: RE | Admit: 2019-02-25 | Discharge: 2019-02-25 | Disposition: A | Discharge status: Home / Self Care | Payer: 59 | Source: Ambulatory Visit | Attending: Neurosurgery | Admitting: Neurosurgery

## 2019-02-25 DIAGNOSIS — S32050A Wedge compression fracture of fifth lumbar vertebra, initial encounter for closed fracture: Secondary | ICD-10-CM

## 2019-02-25 DIAGNOSIS — M545 Low back pain: Secondary | ICD-10-CM | POA: Diagnosis not present

## 2019-02-25 MED ORDER — GADOBENATE DIMEGLUMINE 529 MG/ML IV SOLN
20.0000 mL | Freq: Once | INTRAVENOUS | Status: AC | PRN
  Administered 2019-02-25: 17:00:00 20 mL via INTRAVENOUS

## 2019-03-04 DIAGNOSIS — M5136 Other intervertebral disc degeneration, lumbar region: Secondary | ICD-10-CM | POA: Diagnosis not present

## 2019-03-04 DIAGNOSIS — M48062 Spinal stenosis, lumbar region with neurogenic claudication: Secondary | ICD-10-CM | POA: Diagnosis not present

## 2019-03-04 DIAGNOSIS — S32050A Wedge compression fracture of fifth lumbar vertebra, initial encounter for closed fracture: Secondary | ICD-10-CM | POA: Diagnosis not present

## 2019-03-04 DIAGNOSIS — M47816 Spondylosis without myelopathy or radiculopathy, lumbar region: Secondary | ICD-10-CM | POA: Diagnosis not present

## 2019-03-12 DIAGNOSIS — M545 Low back pain: Secondary | ICD-10-CM | POA: Diagnosis not present

## 2019-03-12 DIAGNOSIS — S32050A Wedge compression fracture of fifth lumbar vertebra, initial encounter for closed fracture: Secondary | ICD-10-CM | POA: Diagnosis not present

## 2019-03-30 DIAGNOSIS — Z9889 Other specified postprocedural states: Secondary | ICD-10-CM | POA: Diagnosis not present

## 2019-03-30 DIAGNOSIS — S32050A Wedge compression fracture of fifth lumbar vertebra, initial encounter for closed fracture: Secondary | ICD-10-CM | POA: Diagnosis not present

## 2019-05-13 ENCOUNTER — Ambulatory Visit (INDEPENDENT_AMBULATORY_CARE_PROVIDER_SITE_OTHER): Payer: Medicare Other | Admitting: Family Medicine

## 2019-05-13 ENCOUNTER — Other Ambulatory Visit: Payer: Self-pay

## 2019-05-13 ENCOUNTER — Encounter: Payer: Self-pay | Admitting: Family Medicine

## 2019-05-13 VITALS — BP 124/75 | HR 93 | Temp 97.3°F | Ht 71.0 in | Wt 226.6 lb

## 2019-05-13 DIAGNOSIS — G40209 Localization-related (focal) (partial) symptomatic epilepsy and epileptic syndromes with complex partial seizures, not intractable, without status epilepticus: Secondary | ICD-10-CM

## 2019-05-13 NOTE — Progress Notes (Signed)
PATIENT: Benjamin [name] DOB: [birth year]  REASON FOR VISIT: follow up HISTORY FROM: patient  Chief Complaint  Patient presents with   Follow-up    4 mon f/u. Alone. New room. Patient stated that things have been going pretty good. He wants to discuss his driving restrictions.      HISTORY OF PRESENT ILLNESS: Today 05/14/19 Benjamin [name] is a 68 y.o. male here today for follow up for nocturnal seizures. Last known seizure was 12/08/2018. He continues levetiracetam XR 1500mg  at bedtime.  He reports that he is taking his medication every day.  He thinks that he has had 1-2 missed doses just after starting Keppra but no missed doses recently.  He takes his medication every night between 10 PM and 11 PM.  He denies any seizure activity.  He is feeling well.  He is requesting permission to drive.    HISTORY: (copied from Dr Dohmeier's note on 01/27/2019)  I have the pleasure of meeting with Benjamin [name] later today, and meanwhile 68 year old gentleman who retired in November 2019 from the [employer].  He has a history of a seizure disorder and an EEG was performed yesterday at this office which showed normal background rhythm, and no elect epileptic activity and awake, drowsy and asleep stages. No abnormal rhythmicity during photic stimulation or hyperventilation was detected. The posterior background rhythm is 10 Hz which is a healthy background rhythm.( Slowing below 8 Hz is considered encephalopathic). He has less stress.  He continues his antiepileptic medication. MOCA 25/ 30 points. MRI brain normal.  [place] law requires him not to drive for 6 month since his last nocturnal seizure, dated 12-08-2018.  08-18-2018, RV yearly MOCA 24/ 30 points, possibly 2 nocturnal seizures. He has had a HST through PCP , after his wife reported him snoring.  HST attempted twice, each night some emergency came up. Psoriasis. MCI. Mind racing. Worried often, some contributing to insomnia. He is finally  retiring this year, stressful environment.    08-15-2017, patient with incontinence and impotence after prostate cancer.He has spinal stenosis. He has been for many years treated for seizures and had no recurrence for years. His medication needs to be refilled. He had repeatedly remarked upon cognitive changes and is to be tested for memory as well.    MM- Benjamin [name] is a 68 year old male with a history of seizures. He returns today for follow-up. He is currently on Keppra 750 mg twice a day. He reports that he is tolerating this medication well. No additional seizures. 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 He operates a Teacher, music. He feels that his memory has remained stable. He states he continues to have a hard time with names but otherwise no additional challenges. He denies any new medical issues. He returns today for an evaluation.  REVIEW OF SYSTEMS: Out of a complete 14 system review of symptoms, the patient complains only of the following symptoms, insomnia, runny nose, depression and all other reviewed systems are negative.  ALLERGIES: Allergies  Allergen Reactions   Aspartame And Phenylalanine Other (See Comments)    Powder causes seizures   Benadryl [Diphenhydramine] Other (See Comments)    seizures   Zoloft [Sertraline Hcl] Other (See Comments)    Confusion Memory loss    HOME MEDICATIONS: Outpatient Medications Prior to Visit  Medication Sig Dispense Refill   ALPRAZolam (XANAX) 1 MG tablet 0.5 mg at night if not able to sleep- this is  for insomnia. 30 tablet 0   cyclobenzaprine (FLEXERIL) 10 MG tablet Take 1 tablet (10 mg total) by mouth 2 (two) times daily as needed for muscle spasms. 20 tablet 0   levETIRAcetam (KEPPRA XR) 500 MG 24 hr tablet Take 3 tabs once a day- best at bedtime - by moth - this is a 24 hour release form. 90 tablet 5   No facility-administered medications prior to visit.     PAST MEDICAL  HISTORY: Past Medical History:  Diagnosis Date   Acute prostatitis 07/03/12   s/p prostate    Allergy    Anxiety    Arthritis    stenosis lower back   Back pain    BPH (benign prostatic hypertrophy) with urinary obstruction    Depression    ED (erectile dysfunction)    Headache(784.0)    "sinus" headaches   History of ITP    as a child per patient   Hypercholesterolemia    Other forms of epilepsy and recurrent seizures without mention of intractable epilepsy 09/16/2013   Peripheral axonal neuropathy 08/06/2014   Prostate cancer ([place]) 07/03/12   Adenocarcinoma,gleason=3+3=6,PSA=5.0,volume=20cc   Seizures ([place])    Unspecified hereditary and idiopathic peripheral neuropathy 07/07/2014   Foot numbness starting 2010, hands beginning in 2014- 15 .    PAST SURGICAL HISTORY: Past Surgical History:  Procedure Laterality Date   COLONOSCOPY     KNEE ARTHROSCOPY  2017   LUMBAR LAMINECTOMY/DECOMPRESSION MICRODISCECTOMY N/A 12/23/2013   Procedure: Lumbar Laminectomy Decompression, Lumbar Two, Three, Four, Five;  Surgeon: Hosie Spangle, MD;  Location: [facility];  Service: Neurosurgery;  Laterality: N/A;   LYMPHADENECTOMY Bilateral 05/25/2016   Procedure: BILATERAL PELVIC LYMPHADENECTOMY;  Surgeon: Alexis Frock, MD;  Location: [facility];  Service: Urology;  Laterality: Bilateral;   ROBOT ASSISTED LAPAROSCOPIC RADICAL PROSTATECTOMY N/A 05/25/2016   Procedure: XI ROBOTIC ASSISTED LAPAROSCOPIC RADICAL PROSTATECTOMY WITH INDOCYANINE GREEN DYE;  Surgeon: Alexis Frock, MD;  Location: [facility];  Service: Urology;  Laterality: N/A;   TOE SURGERY      FAMILY HISTORY: Family History  Problem Relation Age of Onset   Cancer Father 49       prostate/prostatectomy   Cancer Cousin 71       prostate ca, also heart problems    SOCIAL HISTORY: Social History   Socioeconomic History   Marital status: Married    Spouse name: [name]   Number of children: 1   Years of  education: Asso x 2   Highest education level: Not on file  Occupational History    Employer: [employer]  Social Needs   Emergency planning/management officer strain: Not on file   Food insecurity    Worry: Not on file    Inability: Not on file   Transportation needs    Medical: Not on file    Non-medical: Not on file  Tobacco Use   Smoking status: Never Smoker   Smokeless tobacco: Never Used  Substance and Sexual Activity   Alcohol use: Yes    Comment: social beer occasionally   Drug use: No    Comment: chewed in high school short time   Sexual activity: Yes  Lifestyle   Physical activity    Days per week: Not on file    Minutes per session: Not on file   Stress: Not on file  Relationships   Social connections    Talks on phone: Not on file    Gets together: Not on file    Attends  religious service: Not on file    Active member of club or organization: Not on file    Attends meetings of clubs or organizations: Not on file    Relationship status: Not on file   Intimate partner violence    Fear of current or ex partner: Not on file    Emotionally abused: Not on file    Physically abused: Not on file    Forced sexual activity: Not on file  Other Topics Concern   Not on file  Social History Narrative   Patient is married [name]) and lives at home with his wife and 1 child and a step-child.   Patient is working full-time.   Patient has two Associate degrees.   Patient is right-handed.   Patient drinks four cups of tea daily.    PHYSICAL EXAM  Vitals:   05/13/19 1356  BP: 124/75  Pulse: 93  Temp: (!) 97.3 F (36.3 C)  TempSrc: Oral  Weight: 226 lb 9.6 oz (102.8 kg)  Height: 5\' 11"  (1.803 m)   Body mass index is 31.6 kg/m.  Generalized: Well developed, in no acute distress  Cardiology: normal rate and rhythm, no murmur noted Neurological examination  Mentation: Alert oriented to time, place, history taking. Follows all commands speech and language  fluent Cranial nerve II-XII: Pupils were equal round reactive to light. Extraocular movements were full, visual field were full on confrontational test. Facial sensation and strength were normal. Uvula tongue midline. Head turning and shoulder shrug  were normal and symmetric. Motor: The motor testing reveals 5 over 5 strength of all 4 extremities. Good symmetric motor tone is noted throughout.  Sensory: Sensory testing is intact to soft touch on all 4 extremities. No evidence of extinction is noted.  Coordination: Cerebellar testing reveals good finger-nose-finger and heel-to-shin bilaterally.  Gait and station: Gait is normal.   DIAGNOSTIC DATA (LABS, IMAGING, TESTING) - I reviewed patient records, labs, notes, testing and imaging myself where available.  MMSE - Mini Mental State Exam 08/15/2016 08/10/2015  Orientation to time 5 5  Orientation to Place 5 5  Registration 3 3  Attention/ Calculation 5 5  Recall 3 3  Language- name 2 objects 2 2  Language- repeat 1 1  Language- follow 3 step command 3 2  Language- read & follow direction 1 1  Write a sentence 1 1  Copy design 1 1  Total score 30 29     Lab Results  Component Value Date   WBC 8.1 05/13/2019   HGB 14.8 05/13/2019   HCT 42.6 05/13/2019   MCV 87 05/13/2019   PLT 207 05/13/2019      Component Value Date/Time   NA 140 05/13/2019 1428   K 4.4 05/13/2019 1428   CL 102 05/13/2019 1428   CO2 23 05/13/2019 1428   GLUCOSE 124 (H) 05/13/2019 1428   GLUCOSE 165 (H) 12/08/2018 1035   BUN 12 05/13/2019 1428   CREATININE 1.15 05/13/2019 1428   CALCIUM 8.9 05/13/2019 1428   PROT 6.2 05/13/2019 1428   ALBUMIN 4.4 05/13/2019 1428   AST 23 05/13/2019 1428   ALT 22 05/13/2019 1428   ALKPHOS 70 05/13/2019 1428   BILITOT 0.3 05/13/2019 1428   GFRNONAA 66 05/13/2019 1428   GFRAA 76 05/13/2019 1428   No results found for: CHOL, HDL, LDLCALC, LDLDIRECT, TRIG, CHOLHDL No results found for: HGBA1C Lab Results  Component Value  Date   VITAMINB12 354 07/07/2014   Lab Results  Component Value  Date   TSH 1.510 07/07/2014    ASSESSMENT AND PLAN 68 y.o. year old male  has a past medical history of Acute prostatitis (07/03/12), Allergy, Anxiety, Arthritis, Back pain, BPH (benign prostatic hypertrophy) with urinary obstruction, Depression, ED (erectile dysfunction), Headache(784.0), History of ITP, Hypercholesterolemia, Other forms of epilepsy and recurrent seizures without mention of intractable epilepsy (09/16/2013), Peripheral axonal neuropathy (08/06/2014), Prostate cancer ([place]) (07/03/12), Seizures ([place]), and Unspecified hereditary and idiopathic peripheral neuropathy (07/07/2014). here with     ICD-10-CM   1. Partial symptomatic epilepsy with complex partial seizures, not intractable, without status epilepticus (HCC)  G40.209 CBC with Differential/Platelets    CMP    Levetiracetam level    Mr. Lott continues to do well on Keppra 1500 mg XR every night.  He denies any recent missed doses.  No seizure activity.  We will update labs today.  His last seizure was 12/08/2018. He was given seizure precautions and advised that we will fill out paperwork when due assuming he continues to be seizure free. He was instructed not to drive until paperwork is complete. He will follow up with [place] annually. He verbalizes understanding and agreement with this plan.    Orders Placed This Encounter  Procedures   CBC with Differential/Platelets   CMP   Levetiracetam level     No orders of the defined types were placed in this encounter.     I spent 15 minutes with the patient. 50% of this time was spent counseling and educating patient on plan of care and medications.    Debbora Presto, FNP-C 05/14/2019, 11:14 AM [facility] [address] [place],  [address] [phone]

## 2019-05-14 ENCOUNTER — Encounter: Payer: Self-pay | Admitting: Family Medicine

## 2019-05-15 LAB — CBC WITH DIFFERENTIAL/PLATELET
Basophils Absolute: 0 10*3/uL (ref 0.0–0.2)
Basos: 0 %
EOS (ABSOLUTE): 0.1 10*3/uL (ref 0.0–0.4)
Eos: 1 %
Hematocrit: 42.6 % (ref 37.5–51.0)
Hemoglobin: 14.8 g/dL (ref 13.0–17.7)
Immature Grans (Abs): 0 10*3/uL (ref 0.0–0.1)
Immature Granulocytes: 0 %
Lymphocytes Absolute: 2 10*3/uL (ref 0.7–3.1)
Lymphs: 25 %
MCH: 30.1 pg (ref 26.6–33.0)
MCHC: 34.7 g/dL (ref 31.5–35.7)
MCV: 87 fL (ref 79–97)
Monocytes Absolute: 0.7 10*3/uL (ref 0.1–0.9)
Monocytes: 8 %
Neutrophils Absolute: 5.3 10*3/uL (ref 1.4–7.0)
Neutrophils: 66 %
Platelets: 207 10*3/uL (ref 150–450)
RBC: 4.91 x10E6/uL (ref 4.14–5.80)
RDW: 13.1 % (ref 11.6–15.4)
WBC: 8.1 10*3/uL (ref 3.4–10.8)

## 2019-05-15 LAB — COMPREHENSIVE METABOLIC PANEL
ALT: 22 IU/L (ref 0–44)
AST: 23 IU/L (ref 0–40)
Albumin/Globulin Ratio: 2.4 — ABNORMAL HIGH (ref 1.2–2.2)
Albumin: 4.4 g/dL (ref 3.8–4.8)
Alkaline Phosphatase: 70 IU/L (ref 39–117)
BUN/Creatinine Ratio: 10 (ref 10–24)
BUN: 12 mg/dL (ref 8–27)
Bilirubin Total: 0.3 mg/dL (ref 0.0–1.2)
CO2: 23 mmol/L (ref 20–29)
Calcium: 8.9 mg/dL (ref 8.6–10.2)
Chloride: 102 mmol/L (ref 96–106)
Creatinine, Ser: 1.15 mg/dL (ref 0.76–1.27)
GFR calc Af Amer: 76 mL/min/{1.73_m2} (ref >59)
GFR calc non Af Amer: 66 mL/min/{1.73_m2} (ref >59)
Globulin, Total: 1.8 g/dL (ref 1.5–4.5)
Glucose: 124 mg/dL — ABNORMAL HIGH (ref 65–99)
Potassium: 4.4 mmol/L (ref 3.5–5.2)
Sodium: 140 mmol/L (ref 134–144)
Total Protein: 6.2 g/dL (ref 6.0–8.5)

## 2019-05-15 LAB — LEVETIRACETAM LEVEL: Levetiracetam Lvl: 15.3 ug/mL (ref 10.0–40.0)

## 2019-05-18 ENCOUNTER — Telehealth: Payer: Self-pay | Admitting: *Deleted

## 2019-05-18 NOTE — Telephone Encounter (Signed)
Patient returned call and I reviewed the lab results with him. He says that he had eaten before the appointment. Pt voiced understanding and appreciation and did not have any additional questions.

## 2019-05-18 NOTE — Telephone Encounter (Signed)
LMVM for pt that was calling with lab results.  Please return call when able.

## 2019-05-18 NOTE — Telephone Encounter (Signed)
-----   Message from Debbora Presto, NP sent at 05/18/2019  8:38 AM EDT ----- Labs look ok. Continue to follow closely with PCP as glucose was a little elevated. If he had eaten prior to visit this is ok. If not, elevated glucose could be a sign of diabetes.

## 2019-05-20 ENCOUNTER — Telehealth: Payer: Self-pay | Admitting: *Deleted

## 2019-05-20 NOTE — Telephone Encounter (Signed)
I called pt, no answer I was unable to leave a voicemail.

## 2019-05-26 DIAGNOSIS — Z0289 Encounter for other administrative examinations: Secondary | ICD-10-CM

## 2019-05-28 NOTE — Telephone Encounter (Signed)
Pt has returned call to [facility], he is asking for a call back

## 2019-05-28 NOTE — Telephone Encounter (Signed)
Spoke to pt.  He answered my questions.  Completed DMV form and placed on AL/NPDesk for review and signature.

## 2019-05-28 NOTE — Telephone Encounter (Signed)
DMV (LMVM for pt that I had a few questions) asked for a return call.

## 2019-06-01 NOTE — Telephone Encounter (Signed)
Signed.  To MR.  

## 2019-06-03 NOTE — Telephone Encounter (Signed)
Dmv form faxed on 06/01/21 to [facility]mv.

## 2019-07-30 ENCOUNTER — Ambulatory Visit: Payer: 59 | Admitting: Family Medicine

## 2019-09-04 DIAGNOSIS — E781 Pure hyperglyceridemia: Secondary | ICD-10-CM | POA: Diagnosis not present

## 2019-09-04 DIAGNOSIS — G40909 Epilepsy, unspecified, not intractable, without status epilepticus: Secondary | ICD-10-CM | POA: Diagnosis not present

## 2019-09-04 DIAGNOSIS — R0789 Other chest pain: Secondary | ICD-10-CM | POA: Diagnosis not present

## 2019-09-04 DIAGNOSIS — M810 Age-related osteoporosis without current pathological fracture: Secondary | ICD-10-CM | POA: Diagnosis not present

## 2019-09-08 ENCOUNTER — Other Ambulatory Visit: Payer: Self-pay | Admitting: Neurology

## 2019-09-08 DIAGNOSIS — M4800 Spinal stenosis, site unspecified: Secondary | ICD-10-CM | POA: Diagnosis not present

## 2019-09-08 DIAGNOSIS — S51811A Laceration without foreign body of right forearm, initial encounter: Secondary | ICD-10-CM | POA: Diagnosis not present

## 2019-09-08 DIAGNOSIS — Z23 Encounter for immunization: Secondary | ICD-10-CM | POA: Diagnosis not present

## 2019-11-19 ENCOUNTER — Other Ambulatory Visit: Payer: Self-pay

## 2019-11-19 ENCOUNTER — Ambulatory Visit (INDEPENDENT_AMBULATORY_CARE_PROVIDER_SITE_OTHER): Payer: Medicare Other | Admitting: Neurology

## 2019-11-19 ENCOUNTER — Encounter: Payer: Self-pay | Admitting: Neurology

## 2019-11-19 VITALS — BP 128/92 | HR 76 | Temp 97.1°F | Ht 71.0 in | Wt 231.0 lb

## 2019-11-19 DIAGNOSIS — G40209 Localization-related (focal) (partial) symptomatic epilepsy and epileptic syndromes with complex partial seizures, not intractable, without status epilepticus: Secondary | ICD-10-CM | POA: Diagnosis not present

## 2019-11-19 DIAGNOSIS — G6289 Other specified polyneuropathies: Secondary | ICD-10-CM | POA: Diagnosis not present

## 2019-11-19 DIAGNOSIS — C61 Malignant neoplasm of prostate: Secondary | ICD-10-CM

## 2019-11-19 DIAGNOSIS — L4050 Arthropathic psoriasis, unspecified: Secondary | ICD-10-CM

## 2019-11-19 MED ORDER — LEVETIRACETAM ER 500 MG PO TB24: ORAL_TABLET | ORAL | 1 refills | Status: DC

## 2019-11-19 NOTE — Progress Notes (Signed)
PATIENT: Benjamin [name] DOB: January 01, 1951  REASON FOR VISIT: follow up- seizures HISTORY FROM: patient  HISTORY OF PRESENT ILLNESS:  11-19-2019; Happily retired, Benjamin [name] is seen here for a regular RV. He has a neuropathy and a seizure disorder. Last seizure in January 2020, On Keppra 750 mg twice a day. He reports that he is tolerating this medication well. No additional seizures. 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 Adheres to meds, not drinking.  He feels that his memory has remained stable.   01-27-2019, I have the pleasure of meeting with Benjamin [name] later today, and meanwhile 69 year old gentleman who retired in November 2019 from the [employer].  He has a history of a seizure disorder and an EEG was performed yesterday at this office which showed normal background rhythm, and no elect epileptic activity and awake, drowsy and asleep stages. No abnormal rhythmicity during photic stimulation or hyperventilation was detected. The posterior background rhythm is 10 Hz which is a healthy background rhythm.( Slowing below 8 Hz is considered encephalopathic). He has less stress.  He continues his antiepileptic medication. MOCA 25/ 30 points. MRI brain normal.  [place] law requires him not to drive for 6 month since his last nocturnal seizure, dated 12-08-2018.  08-18-2018, RV yearly MOCA 24/ 30 points, possibly 2 nocturnal seizures. He has had a HST through PCP , after his wife reported him snoring.  HST attempted twice, each night some emergency came up. Psoriasis. MCI. Mind racing. Worried often, some contributing to insomnia. He is finally retiring this year, stressful environment.    08-15-2017, patient with incontinence and impotence after prostate cancer.He has spinal stenosis. He has been for many years treated for seizures and had no recurrence for years. His medication needs to be refilled. He had repeatedly  remarked upon cognitive changes and is to be tested for memory as well.    MM- Benjamin [name] is a 69 year old male with a history of seizures. He returns today for follow-up. He is currently on Keppra 750 mg twice a day. He reports that he is tolerating this medication well. No additional seizures. 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 He operates a Teacher, music. He feels that his memory has remained stable. He states he continues to have a hard time with names but otherwise no additional challenges. He denies any new medical issues. He returns today for an evaluation.  REVIEW OF SYSTEMS: Out of a complete 14 system review of symptoms, the patient complains only of the following symptoms, and all other reviewed systems are negative.  Incontinence of bladder, memory loss, sleep walking- has urinated in the bathroom garbage can.  Stopped his Zoloft "stress medication" and it hasn't happened since.  Was this a seizure? He has seizures at night- can drive. His memory improved since retirement.  ALLERGIES: Allergies  Allergen Reactions  . Aspartame And Phenylalanine Other (See Comments)    Powder causes seizures  . Benadryl [Diphenhydramine] Other (See Comments)    seizures  . Zoloft [Sertraline Hcl] Other (See Comments)    Confusion Memory loss    HOME MEDICATIONS: Outpatient Medications Prior to Visit  Medication Sig Dispense Refill  . alendronate (FOSAMAX) 70 MG tablet PLEASE SEE ATTACHED FOR DETAILED DIRECTIONS    . levETIRAcetam (KEPPRA XR) 500 MG 24 hr tablet TAKE 3 TABLETS BY MOUTH ONCE A DAY BEST AT BEDTIME 270 tablet 1  . ALPRAZolam (XANAX) 1 MG tablet 0.5 mg at night if not able to sleep- this is for insomnia. (Patient not taking: Reported on 11/19/2019) 30 tablet 0  . cyclobenzaprine (FLEXERIL) 10 MG tablet Take  1 tablet (10 mg total) by mouth 2 (two) times daily as needed for muscle spasms. (Patient not taking: Reported on 11/19/2019) 20 tablet 0   No facility-administered medications prior to visit.    PAST MEDICAL HISTORY: Past Medical History:  Diagnosis Date  . Acute prostatitis 07/03/12   s/p prostate   . Allergy   . Anxiety   . Arthritis    stenosis lower back  . Back pain   . BPH (benign prostatic hypertrophy) with urinary obstruction   . Depression   . ED (erectile dysfunction)   . Headache(784.0)    "sinus" headaches  . History of ITP    as a child per patient  . Hypercholesterolemia   . Other forms of epilepsy and recurrent seizures without mention of intractable epilepsy 09/16/2013  . Peripheral axonal neuropathy 08/06/2014  . Prostate cancer ([place]) 07/03/12   Adenocarcinoma,gleason=3+3=6,PSA=5.0,volume=20cc  . Seizures ([place])   . Unspecified hereditary and idiopathic peripheral neuropathy 07/07/2014   Foot numbness starting 2010, hands beginning in 2014- 15 .    PAST SURGICAL HISTORY: Past Surgical History:  Procedure Laterality Date  . COLONOSCOPY    . KNEE ARTHROSCOPY  2017  . LUMBAR LAMINECTOMY/DECOMPRESSION MICRODISCECTOMY N/A 12/23/2013   Procedure: Lumbar Laminectomy Decompression, Lumbar Two, Three, Four, Five;  Surgeon: Hosie Spangle, MD;  Location: [facility];  Service: Neurosurgery;  Laterality: N/A;  . LYMPHADENECTOMY Bilateral 05/25/2016   Procedure: BILATERAL PELVIC LYMPHADENECTOMY;  Surgeon: Alexis Frock, MD;  Location: [facility];  Service: Urology;  Laterality: Bilateral;  . ROBOT ASSISTED LAPAROSCOPIC RADICAL PROSTATECTOMY N/A 05/25/2016   Procedure: XI ROBOTIC ASSISTED LAPAROSCOPIC RADICAL PROSTATECTOMY WITH INDOCYANINE GREEN DYE;  Surgeon: Alexis Frock, MD;  Location: [facility];  Service: Urology;  Laterality: N/A;  . TOE SURGERY      FAMILY HISTORY: Family History  Problem Relation Age of Onset  . Cancer Father 68       prostate/prostatectomy  .  Cancer Cousin 56       prostate ca, also heart problems    SOCIAL HISTORY: Social History   Socioeconomic History  . Marital status: Married    Spouse name: [name]  . Number of children: 1  . Years of education: Asso x 2  . Highest education level: Not on file  Occupational History    Employer: [employer]  Tobacco Use  . Smoking status: Never Smoker  . Smokeless tobacco: Never Used  Substance and Sexual Activity  . Alcohol use: Yes    Comment: social beer occasionally  . Drug use: No    Comment: chewed in high school short time  . Sexual activity: Yes  Other Topics Concern  . Not on file  Social History Narrative   Patient is married [name]) and lives at home with his wife and 1 child and a step-child.   Patient is working full-time.   Patient has two Associate degrees.   Patient is right-handed.   Patient drinks four cups of tea daily.   Social Determinants of Health   Financial Resource Strain:   . Difficulty of Paying Living Expenses: Not on file  Food Insecurity:   . Worried About Charity fundraiser in the Last Year: Not on file  . Ran Out of Food in the Last Year: Not on file  Transportation Needs:   . Lack of Transportation (Medical): Not on file  . Lack of Transportation (Non-Medical): Not on file  Physical Activity:   . Days of Exercise per Week: Not on file  . Minutes of Exercise per Session: Not on file  Stress:   . Feeling of Stress : Not on file  Social Connections:   . Frequency of Communication with Friends and Family: Not on file  . Frequency of Social Gatherings with Friends and Family: Not on file  . Attends Religious Services: Not on file  . Active Member of Clubs or Organizations: Not on file  . Attends Archivist Meetings: Not on file  . Marital Status: Not on file  Intimate Partner Violence:   . Fear of Current or Ex-Partner: Not on file  . Emotionally Abused: Not on file  . Physically Abused: Not on file  . Sexually  Abused: Not on file      PHYSICAL EXAM  Vitals:   11/19/19 1306  BP: (!) 128/92  Pulse: 76  Temp: (!) 97.1 F (36.2 C)  Weight: 231 lb (104.8 kg)  Height: 5\' 11"  (1.803 m)   Body mass index is 32.22 kg/m.   Montreal Cognitive Assessment  08/18/2018 08/18/2018 08/15/2017  Visuospatial/ Executive (0/5) 4 - 5  Naming (0/3) 3 - 3  Attention: Read list of digits (0/2) 2 - 2  Attention: Read list of letters (0/1) 1 - 1  Attention: Serial 7 subtraction starting at 100 (0/3) 3 - 3  Language: Repeat phrase (0/2) 1 - 0  Language : Fluency (0/1) 0 - 0  Abstraction (0/2) 2 - 2  Delayed Recall (0/5) 3 - 3  Orientation (0/6) 5 5 6   Total 24 - 25     MMSE - Mini Mental State Exam 08/15/2016 08/10/2015  Orientation to time 5 5  Orientation to Place 5 5  Registration 3 3  Attention/ Calculation 5 5  Recall 3 3  Language- name 2 objects 2 2  Language- repeat 1 1  Language- follow 3 step command 3 2  Language- read & follow direction 1 1  Write a sentence 1 1  Copy design 1 1  Total score 30 29    Psoriatic  lesions to both knees and elbows.   Generalized: Well developed, in no acute distress - he appears very tired.   Neurological examination  Mentation: Alert oriented to time, place, history taking. Follows all commands speech and language fluent Cranial nerve  no loss of taste and intact smell - Pupils were equal round reactive to light.  Extraocular movements were full, visual field were full on confrontational test.  Facial sensation and strength were normal.  Uvula and tongue move in midline.  Head turning and shoulder shrug  were normal and symmetric. Motor: Loss of quadriceps mass bilaterally- equal grip strength. Poor hip extension.  Arthritis,  Sensory: Coordination:  Gait and station: Arthralgic gait,  Improved since surgery.   Reflexes: Deep tendon reflexes are symmetrically attenuated bilaterally.    DIAGNOSTIC DATA (LABS, IMAGING, TESTING) - I reviewed  patient records, labs, notes, testing and imaging myself where available.  Lab Results  Component Value Date   WBC 8.1 05/13/2019   HGB 14.8 05/13/2019   HCT 42.6 05/13/2019   MCV 87 05/13/2019   PLT 207 05/13/2019      Component Value Date/Time   NA 140 05/13/2019 1428   K 4.4 05/13/2019 1428   CL 102 05/13/2019 1428   CO2 23 05/13/2019 1428   GLUCOSE 124 (H) 05/13/2019 1428   GLUCOSE 165 (H) 12/08/2018 1035   BUN 12 05/13/2019 1428   CREATININE 1.15 05/13/2019 1428   CALCIUM 8.9 05/13/2019 1428   PROT 6.2 05/13/2019 1428   ALBUMIN 4.4 05/13/2019 1428   AST 23 05/13/2019 1428   ALT 22 05/13/2019 1428   ALKPHOS 70 05/13/2019 1428   BILITOT 0.3 05/13/2019 1428   GFRNONAA 66 05/13/2019 1428   GFRAA 76 05/13/2019 1428       ASSESSMENT AND PLAN :    1. Nocturnal Seizures- refilled medication - he had 2 nocturnal seizures , each after a forgetting his medication, one after some alcohol ( no history of abuse).    2  less stress and better sleep in retirement , helping his anxiety/ insomnia.    3. He likes the taste of beer but instructed not to drink more than one standard drink and never to drive after that.   Overall the patient is doing well. He will continue on Keppra 750 mg twice a day.  His memory score has remained stable- MOCA revealed only  mild cognitive impairment.  We will continue to monitor yearly. I offered an attended sleep study- he declined.   He will follow-up with NP in 12 month or sooner if needed.     Larey Seat, MD  11/19/2019, 1:19 PM [facility] [address], [place], [place] [address] [phone]

## 2019-11-19 NOTE — Patient Instructions (Signed)
Levetiracetam extended-release tablets What is this medicine? LEVETIRACETAM (lee ve tye RA se tam) is an antiepileptic drug. It is used with other medicines to treat certain types of seizures. This medicine may be used for other purposes; ask your health care provider or pharmacist if you have questions. COMMON BRAND NAME(S): Keppra XR, Roweepra What should I tell my health care provider before I take this medicine? They need to know if you have any of these conditions:  kidney disease  suicidal thoughts, plans, or attempt; a previous suicide attempt by you or a family member  an unusual or allergic reaction to levetiracetam, other medicines, foods, dyes, or preservatives  pregnant or trying to get pregnant  breast-feeding How should I use this medicine? Take this medicine by mouth with a glass of water. Follow the directions on the prescription label. Do not cut, crush or chew this medicine. You may take this medicine with or without food. Take your doses at regular intervals. Do not take your medicine more often than directed. Do not stop taking this medicine or any of your seizure medicines unless instructed by your doctor or health care professional. Stopping your medicine suddenly can increase your seizures or their severity. A special MedGuide will be given to you by the pharmacist with each prescription and refill. Be sure to read this information carefully each time. Contact your pediatrician or health care professional regarding the use of this medication in children. While this drug may be prescribed for children as young as 12 years of age for selected conditions, precautions do apply. Overdosage: If you think you have taken too much of this medicine contact a poison control center or emergency room at once. NOTE: This medicine is only for you. Do not share this medicine with others. What if I miss a dose? If you miss a dose and it has only been a few hours, take it as soon as you  can. If it is almost time for your next dose, take only that dose. Do not take double or extra doses. What may interact with this medicine? This medicine may interact with the following medications:  carbamazepine  colesevelam  probenecid  sevelamer This list may not describe all possible interactions. Give your health care provider a list of all the medicines, herbs, non-prescription drugs, or dietary supplements you use. Also tell them if you smoke, drink alcohol, or use illegal drugs. Some items may interact with your medicine. What should I watch for while using this medicine? Visit your doctor or health care provider for a regular check on your progress. Wear a medical identification bracelet or chain to say you have epilepsy, and carry a card that lists all your medications. This medicine may cause serious skin reactions. They can happen weeks to months after starting the medicine. Contact your health care provider right away if you notice fevers or flu-like symptoms with a rash. The rash may be red or purple and then turn into blisters or peeling of the skin. Or, you might notice a red rash with swelling of the face, lips or lymph nodes in your neck or under your arms. It is important to take this medicine exactly as instructed by your health care provider. When first starting treatment, your dose may need to be adjusted. It may take weeks or months before your dose is stable. You should contact your doctor or health care provider if your seizures get worse or if you have any new types of seizures. You may   get drowsy or dizzy. Do not drive, use machinery, or do anything that needs mental alertness until you know how this medicine affects you. Do not stand or sit up quickly, especially if you are an older patient. This reduces the risk of dizzy or fainting spells. Alcohol may interfere with the effect of this medicine. Avoid alcoholic drinks. The use of this medicine may increase the chance of  suicidal thoughts or actions. Pay special attention to how you are responding while on this medicine. Any worsening of mood, or thoughts of suicide or dying should be reported to your health care provider right away. The tablet shell for some brands of this medicine does not dissolve. This is normal. The tablet shell may appear in the stool. This is not cause for concern. Women who become pregnant while using this medicine may enroll in the [place] Antiepileptic Drug Pregnancy Registry by calling 1-888-233-2334. This registry collects information about the safety of antiepileptic drug use during pregnancy. What side effects may I notice from receiving this medicine? Side effects that you should report to your doctor or health care professional as soon as possible:  allergic reactions like skin rash, itching or hives, swelling of the face, lips, or tongue  breathing problems  changes in emotions or moods  dark urine  general ill feeling or flu-like symptoms  problems with balance, talking, walking  rash, fever, and swollen lymph nodes  redness, blistering, peeling or loosening of the skin, including inside the mouth  suicidal thoughts or actions  unusually weak or tired  yellowing of the eyes or skin Side effects that usually do not require medical attention (report to your doctor or health care professional if they continue or are bothersome):  diarrhea  dizzy, drowsy  headache  loss of appetite This list may not describe all possible side effects. Call your doctor for medical advice about side effects. You may report side effects to FDA at 1-800-FDA-1088. Where should I keep my medicine? Keep out of reach of children. Store at room temperature between 15 and 30 degrees C (59 and 86 degrees F). Throw away any unused medicine after the expiration date. NOTE: This sheet is a summary. It may not cover all possible information. If you have questions about this medicine,  talk to your doctor, pharmacist, or health care provider.  2020 [employer] (2019-01-30 15:14:16)  

## 2020-01-08 ENCOUNTER — Inpatient Hospital Stay (HOSPITAL_COMMUNITY): Payer: Medicare Other | Admitting: Certified Registered Nurse Anesthetist

## 2020-01-08 ENCOUNTER — Inpatient Hospital Stay (HOSPITAL_COMMUNITY)
Admission: EM | Admit: 2020-01-08 | Discharge: 2020-01-11 | DRG: 029 | Disposition: A | Discharge status: Rehabilitation Facility | Payer: Medicare Other | Attending: Family Medicine | Admitting: Family Medicine

## 2020-01-08 ENCOUNTER — Encounter (HOSPITAL_COMMUNITY): Payer: Self-pay | Admitting: Emergency Medicine

## 2020-01-08 ENCOUNTER — Encounter (HOSPITAL_COMMUNITY)
Admission: EM | Disposition: A | Discharge status: Rehabilitation Facility | Payer: Self-pay | Source: Home / Self Care | Attending: Family Medicine

## 2020-01-08 ENCOUNTER — Inpatient Hospital Stay (HOSPITAL_COMMUNITY): Payer: Medicare Other

## 2020-01-08 ENCOUNTER — Emergency Department (HOSPITAL_COMMUNITY): Payer: Medicare Other

## 2020-01-08 ENCOUNTER — Other Ambulatory Visit: Payer: Self-pay

## 2020-01-08 DIAGNOSIS — W11XXXA Fall on and from ladder, initial encounter: Secondary | ICD-10-CM | POA: Diagnosis present

## 2020-01-08 DIAGNOSIS — G609 Hereditary and idiopathic neuropathy, unspecified: Secondary | ICD-10-CM | POA: Diagnosis present

## 2020-01-08 DIAGNOSIS — Z419 Encounter for procedure for purposes other than remedying health state, unspecified: Secondary | ICD-10-CM

## 2020-01-08 DIAGNOSIS — T40605A Adverse effect of unspecified narcotics, initial encounter: Secondary | ICD-10-CM | POA: Diagnosis not present

## 2020-01-08 DIAGNOSIS — W11XXXD Fall on and from ladder, subsequent encounter: Secondary | ICD-10-CM | POA: Diagnosis present

## 2020-01-08 DIAGNOSIS — G40201 Localization-related (focal) (partial) symptomatic epilepsy and epileptic syndromes with complex partial seizures, not intractable, with status epilepticus: Secondary | ICD-10-CM | POA: Diagnosis not present

## 2020-01-08 DIAGNOSIS — S01512A Laceration without foreign body of oral cavity, initial encounter: Secondary | ICD-10-CM | POA: Diagnosis not present

## 2020-01-08 DIAGNOSIS — Z8042 Family history of malignant neoplasm of prostate: Secondary | ICD-10-CM

## 2020-01-08 DIAGNOSIS — S22088A Other fracture of T11-T12 vertebra, initial encounter for closed fracture: Secondary | ICD-10-CM | POA: Diagnosis not present

## 2020-01-08 DIAGNOSIS — L4050 Arthropathic psoriasis, unspecified: Secondary | ICD-10-CM | POA: Diagnosis present

## 2020-01-08 DIAGNOSIS — Y92009 Unspecified place in unspecified non-institutional (private) residence as the place of occurrence of the external cause: Secondary | ICD-10-CM | POA: Diagnosis not present

## 2020-01-08 DIAGNOSIS — S22082A Unstable burst fracture of T11-T12 vertebra, initial encounter for closed fracture: Secondary | ICD-10-CM | POA: Diagnosis not present

## 2020-01-08 DIAGNOSIS — N400 Enlarged prostate without lower urinary tract symptoms: Secondary | ICD-10-CM | POA: Diagnosis present

## 2020-01-08 DIAGNOSIS — E7800 Pure hypercholesterolemia, unspecified: Secondary | ICD-10-CM | POA: Diagnosis present

## 2020-01-08 DIAGNOSIS — S32039A Unspecified fracture of third lumbar vertebra, initial encounter for closed fracture: Secondary | ICD-10-CM | POA: Diagnosis not present

## 2020-01-08 DIAGNOSIS — F419 Anxiety disorder, unspecified: Secondary | ICD-10-CM | POA: Diagnosis present

## 2020-01-08 DIAGNOSIS — S22089A Unspecified fracture of T11-T12 vertebra, initial encounter for closed fracture: Secondary | ICD-10-CM | POA: Diagnosis present

## 2020-01-08 DIAGNOSIS — Z91018 Allergy to other foods: Secondary | ICD-10-CM | POA: Diagnosis not present

## 2020-01-08 DIAGNOSIS — R569 Unspecified convulsions: Secondary | ICD-10-CM

## 2020-01-08 DIAGNOSIS — G40209 Localization-related (focal) (partial) symptomatic epilepsy and epileptic syndromes with complex partial seizures, not intractable, without status epilepticus: Secondary | ICD-10-CM | POA: Diagnosis not present

## 2020-01-08 DIAGNOSIS — Z79899 Other long term (current) drug therapy: Secondary | ICD-10-CM | POA: Diagnosis not present

## 2020-01-08 DIAGNOSIS — E1165 Type 2 diabetes mellitus with hyperglycemia: Secondary | ICD-10-CM | POA: Diagnosis present

## 2020-01-08 DIAGNOSIS — G629 Polyneuropathy, unspecified: Secondary | ICD-10-CM | POA: Diagnosis present

## 2020-01-08 DIAGNOSIS — L409 Psoriasis, unspecified: Secondary | ICD-10-CM | POA: Diagnosis present

## 2020-01-08 DIAGNOSIS — K5900 Constipation, unspecified: Secondary | ICD-10-CM | POA: Diagnosis not present

## 2020-01-08 DIAGNOSIS — R140 Abdominal distension (gaseous): Secondary | ICD-10-CM | POA: Diagnosis not present

## 2020-01-08 DIAGNOSIS — Z981 Arthrodesis status: Secondary | ICD-10-CM | POA: Diagnosis not present

## 2020-01-08 DIAGNOSIS — Z8546 Personal history of malignant neoplasm of prostate: Secondary | ICD-10-CM

## 2020-01-08 DIAGNOSIS — R4182 Altered mental status, unspecified: Secondary | ICD-10-CM | POA: Diagnosis not present

## 2020-01-08 DIAGNOSIS — S32038A Other fracture of third lumbar vertebra, initial encounter for closed fracture: Secondary | ICD-10-CM | POA: Diagnosis not present

## 2020-01-08 DIAGNOSIS — S32029A Unspecified fracture of second lumbar vertebra, initial encounter for closed fracture: Secondary | ICD-10-CM | POA: Diagnosis not present

## 2020-01-08 DIAGNOSIS — R410 Disorientation, unspecified: Secondary | ICD-10-CM | POA: Diagnosis not present

## 2020-01-08 DIAGNOSIS — S32018A Other fracture of first lumbar vertebra, initial encounter for closed fracture: Secondary | ICD-10-CM | POA: Diagnosis not present

## 2020-01-08 DIAGNOSIS — Z7983 Long term (current) use of bisphosphonates: Secondary | ICD-10-CM | POA: Diagnosis not present

## 2020-01-08 DIAGNOSIS — S32019A Unspecified fracture of first lumbar vertebra, initial encounter for closed fracture: Secondary | ICD-10-CM | POA: Diagnosis not present

## 2020-01-08 DIAGNOSIS — F329 Major depressive disorder, single episode, unspecified: Secondary | ICD-10-CM | POA: Diagnosis present

## 2020-01-08 DIAGNOSIS — F418 Other specified anxiety disorders: Secondary | ICD-10-CM | POA: Diagnosis not present

## 2020-01-08 DIAGNOSIS — R52 Pain, unspecified: Secondary | ICD-10-CM | POA: Diagnosis not present

## 2020-01-08 DIAGNOSIS — Z888 Allergy status to other drugs, medicaments and biological substances status: Secondary | ICD-10-CM | POA: Diagnosis not present

## 2020-01-08 DIAGNOSIS — F22 Delusional disorders: Secondary | ICD-10-CM | POA: Diagnosis not present

## 2020-01-08 DIAGNOSIS — M4807 Spinal stenosis, lumbosacral region: Secondary | ICD-10-CM | POA: Diagnosis not present

## 2020-01-08 DIAGNOSIS — G4700 Insomnia, unspecified: Secondary | ICD-10-CM | POA: Diagnosis present

## 2020-01-08 DIAGNOSIS — I491 Atrial premature depolarization: Secondary | ICD-10-CM | POA: Diagnosis not present

## 2020-01-08 DIAGNOSIS — S22088K Other fracture of T11-T12 vertebra, subsequent encounter for fracture with nonunion: Secondary | ICD-10-CM | POA: Diagnosis not present

## 2020-01-08 DIAGNOSIS — S22080D Wedge compression fracture of T11-T12 vertebra, subsequent encounter for fracture with routine healing: Secondary | ICD-10-CM | POA: Diagnosis not present

## 2020-01-08 DIAGNOSIS — S299XXA Unspecified injury of thorax, initial encounter: Secondary | ICD-10-CM | POA: Diagnosis not present

## 2020-01-08 DIAGNOSIS — G40909 Epilepsy, unspecified, not intractable, without status epilepticus: Secondary | ICD-10-CM | POA: Diagnosis not present

## 2020-01-08 DIAGNOSIS — Z20822 Contact with and (suspected) exposure to covid-19: Secondary | ICD-10-CM | POA: Diagnosis not present

## 2020-01-08 DIAGNOSIS — N529 Male erectile dysfunction, unspecified: Secondary | ICD-10-CM | POA: Diagnosis present

## 2020-01-08 DIAGNOSIS — G6289 Other specified polyneuropathies: Secondary | ICD-10-CM | POA: Diagnosis not present

## 2020-01-08 DIAGNOSIS — S32028A Other fracture of second lumbar vertebra, initial encounter for closed fracture: Secondary | ICD-10-CM | POA: Diagnosis not present

## 2020-01-08 DIAGNOSIS — E119 Type 2 diabetes mellitus without complications: Secondary | ICD-10-CM | POA: Diagnosis not present

## 2020-01-08 DIAGNOSIS — S22089D Unspecified fracture of T11-T12 vertebra, subsequent encounter for fracture with routine healing: Secondary | ICD-10-CM | POA: Diagnosis present

## 2020-01-08 HISTORY — PX: POSTERIOR LUMBAR FUSION 4 LEVEL: SHX6037

## 2020-01-08 LAB — CBC
HCT: 46.9 % (ref 39.0–52.0)
Hemoglobin: 15.9 g/dL (ref 13.0–17.0)
MCH: 30.6 pg (ref 26.0–34.0)
MCHC: 33.9 g/dL (ref 30.0–36.0)
MCV: 90.2 fL (ref 80.0–100.0)
Platelets: 180 10*3/uL (ref 150–400)
RBC: 5.2 MIL/uL (ref 4.22–5.81)
RDW: 12.8 % (ref 11.5–15.5)
WBC: 6.4 10*3/uL (ref 4.0–10.5)
nRBC: 0 % (ref 0.0–0.2)

## 2020-01-08 LAB — BASIC METABOLIC PANEL
Anion gap: 9 (ref 5–15)
BUN: 12 mg/dL (ref 8–23)
CO2: 24 mmol/L (ref 22–32)
Calcium: 8.7 mg/dL — ABNORMAL LOW (ref 8.9–10.3)
Chloride: 106 mmol/L (ref 98–111)
Creatinine, Ser: 1.23 mg/dL (ref 0.61–1.24)
GFR calc Af Amer: >60 mL/min (ref >60)
GFR calc non Af Amer: >60 mL/min (ref >60)
Glucose, Bld: 119 mg/dL — ABNORMAL HIGH (ref 70–99)
Potassium: 4.2 mmol/L (ref 3.5–5.1)
Sodium: 139 mmol/L (ref 135–145)

## 2020-01-08 LAB — ABO/RH: ABO/RH(D): A POS

## 2020-01-08 LAB — SARS CORONAVIRUS 2 (TAT 6-24 HRS): SARS Coronavirus 2: NEGATIVE

## 2020-01-08 LAB — RESPIRATORY PANEL BY RT PCR (FLU A&B, COVID)
Influenza A by PCR: NEGATIVE
Influenza B by PCR: NEGATIVE
SARS Coronavirus 2 by RT PCR: NEGATIVE

## 2020-01-08 LAB — CK: Total CK: 288 U/L (ref 49–397)

## 2020-01-08 LAB — PREPARE RBC (CROSSMATCH)

## 2020-01-08 LAB — HIV ANTIBODY (ROUTINE TESTING W REFLEX): HIV Screen 4th Generation wRfx: NONREACTIVE

## 2020-01-08 SURGERY — POSTERIOR LUMBAR FUSION 4 LEVEL
Anesthesia: General | Site: Back

## 2020-01-08 MED ORDER — ALBUMIN HUMAN 5 % IV SOLN: INTRAVENOUS | Status: DC | PRN

## 2020-01-08 MED ORDER — MIDAZOLAM HCL 2 MG/2ML IJ SOLN
INTRAMUSCULAR | Status: DC | PRN
  Administered 2020-01-08: 2 mg via INTRAVENOUS

## 2020-01-08 MED ORDER — ROCURONIUM BROMIDE 10 MG/ML (PF) SYRINGE
PREFILLED_SYRINGE | INTRAVENOUS | Status: AC
  Filled 2020-01-08: qty 30

## 2020-01-08 MED ORDER — LIDOCAINE 2% (20 MG/ML) 5 ML SYRINGE
INTRAMUSCULAR | Status: DC | PRN
  Administered 2020-01-08: 100 mg via INTRAVENOUS

## 2020-01-08 MED ORDER — LORAZEPAM 2 MG/ML IJ SOLN: 2.0000 mg | Freq: Once | INTRAMUSCULAR | Status: AC

## 2020-01-08 MED ORDER — ACETAMINOPHEN 650 MG RE SUPP: 650.0000 mg | Freq: Four times a day (QID) | RECTAL | Status: DC | PRN

## 2020-01-08 MED ORDER — BUPIVACAINE HCL (PF) 0.5 % IJ SOLN
INTRAMUSCULAR | Status: DC | PRN
  Administered 2020-01-08: 5 mL

## 2020-01-08 MED ORDER — LACTATED RINGERS IV SOLN: INTRAVENOUS | Status: DC | PRN

## 2020-01-08 MED ORDER — LEVETIRACETAM IN NACL 1000 MG/100ML IV SOLN
1000.0000 mg | INTRAVENOUS | Status: AC
  Administered 2020-01-08 (×2): 1000 mg via INTRAVENOUS

## 2020-01-08 MED ORDER — LACTATED RINGERS IV SOLN: INTRAVENOUS | Status: DC

## 2020-01-08 MED ORDER — DEXAMETHASONE SODIUM PHOSPHATE 10 MG/ML IJ SOLN
INTRAMUSCULAR | Status: DC | PRN
  Administered 2020-01-08: 10 mg via INTRAVENOUS

## 2020-01-08 MED ORDER — FENTANYL CITRATE (PF) 100 MCG/2ML IJ SOLN: 25.0000 ug | INTRAMUSCULAR | Status: DC | PRN

## 2020-01-08 MED ORDER — CEFAZOLIN SODIUM-DEXTROSE 2-4 GM/100ML-% IV SOLN
INTRAVENOUS | Status: AC
  Filled 2020-01-08: qty 100

## 2020-01-08 MED ORDER — ROCURONIUM BROMIDE 10 MG/ML (PF) SYRINGE
PREFILLED_SYRINGE | INTRAVENOUS | Status: DC | PRN
  Administered 2020-01-08: 100 mg via INTRAVENOUS

## 2020-01-08 MED ORDER — LIDOCAINE-EPINEPHRINE 1 %-1:100000 IJ SOLN
INTRAMUSCULAR | Status: AC
  Filled 2020-01-08: qty 1

## 2020-01-08 MED ORDER — ONDANSETRON HCL 4 MG/2ML IJ SOLN
INTRAMUSCULAR | Status: DC | PRN
  Administered 2020-01-08: 4 mg via INTRAVENOUS

## 2020-01-08 MED ORDER — THROMBIN 5000 UNITS EX SOLR
CUTANEOUS | Status: AC
  Filled 2020-01-08: qty 5000

## 2020-01-08 MED ORDER — LIDOCAINE-EPINEPHRINE 1 %-1:100000 IJ SOLN
INTRAMUSCULAR | Status: DC | PRN
  Administered 2020-01-08: 5 mL via INTRADERMAL

## 2020-01-08 MED ORDER — THROMBIN 5000 UNITS EX SOLR
OROMUCOSAL | Status: DC | PRN
  Administered 2020-01-08: 5 mL via TOPICAL

## 2020-01-08 MED ORDER — SODIUM CHLORIDE 0.9 % IV SOLN
INTRAVENOUS | Status: DC | PRN
  Administered 2020-01-08: 20:00:00 500 mL

## 2020-01-08 MED ORDER — PHENYLEPHRINE HCL (PRESSORS) 10 MG/ML IV SOLN
INTRAVENOUS | Status: DC | PRN
  Administered 2020-01-08 (×2): 40 ug via INTRAVENOUS
  Administered 2020-01-08: 80 ug via INTRAVENOUS

## 2020-01-08 MED ORDER — PROPOFOL 10 MG/ML IV BOLUS
INTRAVENOUS | Status: DC | PRN
  Administered 2020-01-08: 160 mg via INTRAVENOUS

## 2020-01-08 MED ORDER — SODIUM CHLORIDE 0.9% IV SOLUTION: Freq: Once | INTRAVENOUS | Status: DC

## 2020-01-08 MED ORDER — PHENYLEPHRINE 40 MCG/ML (10ML) SYRINGE FOR IV PUSH (FOR BLOOD PRESSURE SUPPORT)
PREFILLED_SYRINGE | INTRAVENOUS | Status: AC
  Filled 2020-01-08: qty 10

## 2020-01-08 MED ORDER — 0.9 % SODIUM CHLORIDE (POUR BTL) OPTIME
TOPICAL | Status: DC | PRN
  Administered 2020-01-08: 1000 mL

## 2020-01-08 MED ORDER — FENTANYL CITRATE (PF) 250 MCG/5ML IJ SOLN
INTRAMUSCULAR | Status: AC
  Filled 2020-01-08: qty 5

## 2020-01-08 MED ORDER — ENOXAPARIN SODIUM 40 MG/0.4ML ~~LOC~~ SOLN: 40.0000 mg | SUBCUTANEOUS | Status: DC

## 2020-01-08 MED ORDER — SODIUM CHLORIDE 0.9 % IV SOLN
2000.0000 mg | Freq: Once | INTRAVENOUS | Status: DC
  Filled 2020-01-08: qty 20

## 2020-01-08 MED ORDER — SUCCINYLCHOLINE CHLORIDE 200 MG/10ML IV SOSY
PREFILLED_SYRINGE | INTRAVENOUS | Status: DC | PRN
  Administered 2020-01-08: 120 mg via INTRAVENOUS

## 2020-01-08 MED ORDER — SUGAMMADEX SODIUM 200 MG/2ML IV SOLN
INTRAVENOUS | Status: DC | PRN
  Administered 2020-01-08: 200 mg via INTRAVENOUS

## 2020-01-08 MED ORDER — CEFAZOLIN SODIUM-DEXTROSE 2-3 GM-%(50ML) IV SOLR
INTRAVENOUS | Status: DC | PRN
  Administered 2020-01-08: 2 g via INTRAVENOUS

## 2020-01-08 MED ORDER — ACETAMINOPHEN 325 MG PO TABS
650.0000 mg | ORAL_TABLET | Freq: Four times a day (QID) | ORAL | Status: DC | PRN
  Administered 2020-01-10 (×2): 650 mg via ORAL
  Filled 2020-01-08 (×2): qty 2

## 2020-01-08 MED ORDER — BUPIVACAINE HCL (PF) 0.5 % IJ SOLN
INTRAMUSCULAR | Status: AC
  Filled 2020-01-08: qty 30

## 2020-01-08 MED ORDER — PHENYLEPHRINE HCL-NACL 10-0.9 MG/250ML-% IV SOLN
INTRAVENOUS | Status: DC | PRN
  Administered 2020-01-08: 25 ug/min via INTRAVENOUS

## 2020-01-08 MED ORDER — ONDANSETRON HCL 4 MG/2ML IJ SOLN
INTRAMUSCULAR | Status: AC
  Filled 2020-01-08: qty 4

## 2020-01-08 MED ORDER — MIDAZOLAM HCL 2 MG/2ML IJ SOLN
INTRAMUSCULAR | Status: AC
  Filled 2020-01-08: qty 2

## 2020-01-08 MED ORDER — HYDROMORPHONE HCL 1 MG/ML IJ SOLN: 0.2500 mg | INTRAMUSCULAR | Status: DC | PRN

## 2020-01-08 MED ORDER — FENTANYL CITRATE (PF) 250 MCG/5ML IJ SOLN
INTRAMUSCULAR | Status: DC | PRN
  Administered 2020-01-08: 100 ug via INTRAVENOUS
  Administered 2020-01-08: 50 ug via INTRAVENOUS

## 2020-01-08 MED ORDER — DEXAMETHASONE SODIUM PHOSPHATE 10 MG/ML IJ SOLN
INTRAMUSCULAR | Status: AC
  Filled 2020-01-08: qty 2

## 2020-01-08 MED ORDER — LIDOCAINE 2% (20 MG/ML) 5 ML SYRINGE
INTRAMUSCULAR | Status: AC
  Filled 2020-01-08: qty 15

## 2020-01-08 MED ORDER — SUCCINYLCHOLINE CHLORIDE 200 MG/10ML IV SOSY
PREFILLED_SYRINGE | INTRAVENOUS | Status: AC
  Filled 2020-01-08: qty 10

## 2020-01-08 MED ORDER — LORAZEPAM 2 MG/ML IJ SOLN
INTRAMUSCULAR | Status: AC
  Administered 2020-01-08: 2 mg via INTRAVENOUS
  Filled 2020-01-08: qty 1

## 2020-01-08 SURGICAL SUPPLY — 78 items
ADH SKN CLS APL DERMABOND .7 (GAUZE/BANDAGES/DRESSINGS) ×2
APL SKNCLS STERI-STRIP NONHPOA (GAUZE/BANDAGES/DRESSINGS)
BAG DECANTER FOR FLEXI CONT (MISCELLANEOUS) ×3 IMPLANT
BASKET BONE COLLECTION (BASKET) ×3 IMPLANT
BENZOIN TINCTURE PRP APPL 2/3 (GAUZE/BANDAGES/DRESSINGS) IMPLANT
BLADE CLIPPER SURG (BLADE) IMPLANT
BLADE SURG 11 STRL SS (BLADE) ×3 IMPLANT
BUR MATCHSTICK NEURO 3.0 LAGG (BURR) ×3 IMPLANT
BUR PRECISION FLUTE 5.0 (BURR) ×3 IMPLANT
CANISTER SUCT 3000ML PPV (MISCELLANEOUS) ×3 IMPLANT
CLOSURE WOUND 1/2 X4 (GAUZE/BANDAGES/DRESSINGS)
CNTNR URN SCR LID CUP LEK RST (MISCELLANEOUS) ×1 IMPLANT
CONT SPEC 4OZ STRL OR WHT (MISCELLANEOUS) ×3
COVER BACK TABLE 60X90IN (DRAPES) ×3 IMPLANT
COVER WAND RF STERILE (DRAPES) ×3 IMPLANT
DECANTER SPIKE VIAL GLASS SM (MISCELLANEOUS) ×3 IMPLANT
DERMABOND ADVANCED (GAUZE/BANDAGES/DRESSINGS) ×4
DERMABOND ADVANCED .7 DNX12 (GAUZE/BANDAGES/DRESSINGS) ×1 IMPLANT
DRAIN JACKSON PRT FLT 10 (DRAIN) ×2 IMPLANT
DRAPE C-ARM 42X72 X-RAY (DRAPES) IMPLANT
DRAPE C-ARMOR (DRAPES) IMPLANT
DRAPE LAPAROTOMY 100X72X124 (DRAPES) ×3 IMPLANT
DRAPE SURG 17X23 STRL (DRAPES) ×3 IMPLANT
DURAPREP 26ML APPLICATOR (WOUND CARE) ×3 IMPLANT
ELECT REM PT RETURN 9FT ADLT (ELECTROSURGICAL) ×3
ELECTRODE REM PT RTRN 9FT ADLT (ELECTROSURGICAL) ×1 IMPLANT
GAUZE 4X4 16PLY RFD (DISPOSABLE) IMPLANT
GAUZE SPONGE 4X4 12PLY STRL (GAUZE/BANDAGES/DRESSINGS) IMPLANT
GLOVE BIO SURGEON STRL SZ7 (GLOVE) ×6 IMPLANT
GLOVE BIO SURGEON STRL SZ7.5 (GLOVE) ×6 IMPLANT
GLOVE BIOGEL PI IND STRL 7.5 (GLOVE) ×2 IMPLANT
GLOVE BIOGEL PI IND STRL 8.5 (GLOVE) IMPLANT
GLOVE BIOGEL PI INDICATOR 7.5 (GLOVE) ×8
GLOVE BIOGEL PI INDICATOR 8.5 (GLOVE) ×2
GLOVE ECLIPSE 8.0 STRL XLNG CF (GLOVE) ×2 IMPLANT
GLOVE EXAM NITRILE LRG STRL (GLOVE) IMPLANT
GLOVE EXAM NITRILE XL STR (GLOVE) IMPLANT
GLOVE EXAM NITRILE XS STR PU (GLOVE) IMPLANT
GOWN STRL REUS W/ TWL LRG LVL3 (GOWN DISPOSABLE) ×4 IMPLANT
GOWN STRL REUS W/ TWL XL LVL3 (GOWN DISPOSABLE) IMPLANT
GOWN STRL REUS W/TWL 2XL LVL3 (GOWN DISPOSABLE) IMPLANT
GOWN STRL REUS W/TWL LRG LVL3 (GOWN DISPOSABLE) ×12
GOWN STRL REUS W/TWL XL LVL3 (GOWN DISPOSABLE)
GRAFT BN 10X1XDBM MAGNIFUSE (Bone Implant) IMPLANT
GRAFT BONE MAGNIFUSE 1X10CM (Bone Implant) ×6 IMPLANT
HEMOSTAT POWDER KIT SURGIFOAM (HEMOSTASIS) ×3 IMPLANT
KIT BASIN OR (CUSTOM PROCEDURE TRAY) ×3 IMPLANT
KIT POSITION SURG JACKSON T1 (MISCELLANEOUS) ×3 IMPLANT
KIT TURNOVER KIT B (KITS) ×3 IMPLANT
MILL MEDIUM DISP (BLADE) ×3 IMPLANT
NDL HYPO 18GX1.5 BLUNT FILL (NEEDLE) IMPLANT
NDL SPNL 18GX3.5 QUINCKE PK (NEEDLE) IMPLANT
NEEDLE HYPO 18GX1.5 BLUNT FILL (NEEDLE) IMPLANT
NEEDLE HYPO 22GX1.5 SAFETY (NEEDLE) ×3 IMPLANT
NEEDLE SPNL 18GX3.5 QUINCKE PK (NEEDLE) IMPLANT
NS IRRIG 1000ML POUR BTL (IV SOLUTION) ×3 IMPLANT
PACK LAMINECTOMY NEURO (CUSTOM PROCEDURE TRAY) ×3 IMPLANT
PAD ARMBOARD 7.5X6 YLW CONV (MISCELLANEOUS) ×9 IMPLANT
ROD 5.5MM SPINAL SOLERA (Rod) ×2 IMPLANT
SCREW 5.5X40 (Screw) ×14 IMPLANT
SCREW FENS SOLERA 5.5X40 (Screw) ×2 IMPLANT
SCREW LUM MA SOLERA 5.5X35 (Screw) ×4 IMPLANT
SCREW SET SOLERA (Screw) ×36 IMPLANT
SCREW SET SOLERA TI5.5 (Screw) IMPLANT
SCREW SOLERA 45X5.5XMA (Screw) IMPLANT
SCREW SOLERA 5.5X45MM (Screw) ×6 IMPLANT
SPONGE LAP 4X18 RFD (DISPOSABLE) IMPLANT
SPONGE SURGIFOAM ABS GEL 100 (HEMOSTASIS) IMPLANT
STRIP CLOSURE SKIN 1/2X4 (GAUZE/BANDAGES/DRESSINGS) IMPLANT
SUT MNCRL AB 3-0 PS2 18 (SUTURE) ×5 IMPLANT
SUT VIC AB 0 CT1 18XCR BRD8 (SUTURE) ×1 IMPLANT
SUT VIC AB 0 CT1 8-18 (SUTURE) ×6
SUT VIC AB 2-0 CP2 18 (SUTURE) ×7 IMPLANT
SYR 3ML LL SCALE MARK (SYRINGE) IMPLANT
TOWEL GREEN STERILE (TOWEL DISPOSABLE) ×3 IMPLANT
TOWEL GREEN STERILE FF (TOWEL DISPOSABLE) ×3 IMPLANT
TRAY FOLEY MTR SLVR 16FR STAT (SET/KITS/TRAYS/PACK) ×3 IMPLANT
WATER STERILE IRR 1000ML POUR (IV SOLUTION) ×3 IMPLANT
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
## 2020-01-08 NOTE — Procedures (Addendum)
Patient Name: [name]  MRN: [id number]  Epilepsy Attending: Lora Havens  Referring Physician/Provider: Dr Amie [place] Date: 01/07/2020 Duration: 25.11 mins  Patient history: Benjamin [name] is a 69 y.o. male past medical history of seizure disorder on Keppra with last seizure many years ago presented to the emergency room for evaluation of breakthrough seizure. EEG to evaluate for seizure.  Level of alertness: asleep/sedated  AEDs during EEG study: Keppra, Ativan  Technical aspects: This EEG study was done with scalp electrodes positioned according to the 10-20 International system of electrode placement. Electrical activity was acquired at a sampling rate of 500Hz  and reviewed with a high frequency filter of 70Hz  and a low frequency filter of 1Hz . EEG data were recorded continuously and digitally stored.   DESCRIPTION: EEG showed an excessive amount of 15 to 18 Hz, 2-3 uV beta activity with irregular morphology distributed symmetrically and diffusely. Sleep was characterized by sleep spindles (12-14hz ), maximal frontocentral. Hyperventilation and photic stimulation were not performed.  ABNORMALITY - Excessive beta, generalized  IMPRESSION: This study is within normal limits. The excessive beta activity seen in the background is most likely due to the effect of benzodiazepine and is a benign EEG pattern. No seizures or epileptiform discharges were seen throughout the recording.  Benjamin [name] Barbra Sarks
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
## 2020-01-08 NOTE — ED Notes (Addendum)
Contacted by CT team @1402   reporting patient seizure. Rapid response was called, pt was given 2mg  of ativan by rapid response RN, 2 g of Keppra started in CT. Upon arrival, rapid response team and neurologist was already at bedside. Pt was given ativan before ED RN arrival. Report given to this RN and Pt transported back to room by this RN after CT completed.

## 2020-01-08 NOTE — Progress Notes (Signed)
Neurosurgery Service Post-operative progress note  Assessment & Plan: 69 y.o. man s/p T9-L2 PSIF for unstable DISH frx, recovering well.  -advance diet / activity as tolerated, no brace / spine precautions needed -pt has JP bulb in place, will continue to monitor, likely d/c drain POD2 -d/c foley in AM -will call pt's wife to update her  Judith Part  01/08/20 10:25 PM

## 2020-01-08 NOTE — Transfer of Care (Signed)
Immediate Anesthesia Transfer of Care Note  Patient: Benjamin [name]  Procedure(s) Performed: THORACIC NINE TO LUMBAR TWO POSTERIOR SPINAL INSTRUMENTED FUSION (N/A Back)  Patient Location: PACU  Anesthesia Type:General  Level of Consciousness: sedated, drowsy, patient cooperative and responds to stimulation  Airway & Oxygen Therapy: Patient Spontanous Breathing and Patient connected to nasal cannula oxygen  Post-op Assessment: Report given to RN, Post -op Vital signs reviewed and stable and Patient moving all extremities X 4  Post vital signs: Reviewed and stable  Last Vitals:  Vitals Value Taken Time  BP 152/102 01/08/20 2240  Temp    Pulse 95 01/08/20 2243  Resp 15 01/08/20 2243  SpO2 96 % 01/08/20 2243  Vitals shown include unvalidated device data.  Last Pain:  Vitals:   01/08/20 1807  TempSrc: Oral         Complications: No apparent anesthesia complications

## 2020-01-08 NOTE — Consult Note (Signed)
Neurology Consultation  Reason for Consult: Multiple seizures in a patient with known seizure disorder Referring Physician: Dr. Pattricia Boss  CC: Multiple seizures  History is obtained from: Chart review  HPI: Benjamin [name] is a 69 y.o. male past medical history of nocturnal seizures, has had to end his life-and is on Keppra 750 twice daily, with last seizure many years ago, depression, back pain, peripheral neuropathy, presented to the emergency room for evaluation of breakthrough seizure. He was evaluated by the ED provider, back to his baseline and was sent for imaging of the back as he had been complaining of acute worsening of his back pain.  He was in the CT scanner when he had a full-blown generalized tonic-clonic seizure and turned purple.  The CT scan technologist started CPR but he never lost pulse. Right after 2 or 3 compressions, his color started to change to be more red and the compressions were stopped as he had pulse. He had frank blood in his mouth as he had bitten his tongue. A stat head CT was obtained-unremarkable for acute process. Patient was unable to provide any history to me during this encounter.  ROS: Unable to obtain due to altered mental status  Past Medical History:  Diagnosis Date  . Acute prostatitis 07/03/12   s/p prostate   . Allergy   . Anxiety   . Arthritis    stenosis lower back  . Back pain   . BPH (benign prostatic hypertrophy) with urinary obstruction   . Depression   . ED (erectile dysfunction)   . Headache(784.0)    "sinus" headaches  . History of ITP    as a child per patient  . Hypercholesterolemia   . Other forms of epilepsy and recurrent seizures without mention of intractable epilepsy 09/16/2013  . Peripheral axonal neuropathy 08/06/2014  . Prostate cancer ([place]) 07/03/12   Adenocarcinoma,gleason=3+3=6,PSA=5.0,volume=20cc  . Seizures ([place])   . Unspecified hereditary and idiopathic peripheral neuropathy 07/07/2014   Foot numbness  starting 2010, hands beginning in 2014- 15 .    Family History  Problem Relation Age of Onset  . Cancer Father 61       prostate/prostatectomy  . Cancer Cousin 50       prostate ca, also heart problems   Social History:   reports that he has never smoked. He has never used smokeless tobacco. He reports current alcohol use. He reports that he does not use drugs.  Medications  Current Facility-Administered Medications:  .  levETIRAcetam (KEPPRA) IVPB 1000 mg/100 mL premix, 1,000 mg, Intravenous, Q15 min, Ray, Danielle, MD .  LORazepam (ATIVAN) 2 MG/ML injection, , , ,  .  LORazepam (ATIVAN) injection 2 mg, 2 mg, Intravenous, Once, Pattricia Boss, MD  Current Outpatient Medications:  .  acetaminophen (TYLENOL) 500 MG tablet, Take 1,000 mg by mouth every 6 (six) hours as needed for mild pain., Disp: , Rfl:  .  alendronate (FOSAMAX) 70 MG tablet, Take 70 mg by mouth once a week. , Disp: , Rfl:  .  levETIRAcetam (KEPPRA) 750 MG tablet, Take 2,250 mg by mouth daily., Disp: , Rfl:  .  ALPRAZolam (XANAX) 1 MG tablet, 0.5 mg at night if not able to sleep- this is for insomnia. (Patient not taking: Reported on 11/19/2019), Disp: 30 tablet, Rfl: 0 .  cyclobenzaprine (FLEXERIL) 10 MG tablet, Take 1 tablet (10 mg total) by mouth 2 (two) times daily as needed for muscle spasms. (Patient not taking: Reported on 11/19/2019), Disp: 20 tablet, Rfl:  0 .  levETIRAcetam (KEPPRA XR) 500 MG 24 hr tablet, TAKE 3 TABLETS BY MOUTH ONCE A DAY BEST AT BEDTIME (Patient not taking: Reported on 01/08/2020), Disp: 270 tablet, Rfl: 1  Exam: Current vital signs: Pulse 79   Temp 98.6 F (37 C)   Ht 6' (1.829 m)   Wt 102 kg   SpO2 95%   BMI 30.50 kg/m  Vital signs in last 24 hours: Temp:  [98.6 F (37 C)] 98.6 F (37 C) (02/26 1321) Pulse Rate:  [79] 79 (02/26 1321) SpO2:  [95 %] 95 % (02/26 1321) Weight:  [102 kg] 102 kg (02/26 1322) General: Saw him right after the seizure on the CT scanner: Obtunded. HEENT:  Normocephalic, dried blood and fresh blood in mouth. CVS regular rate rhythm Respiratory: Breathing normally and saturating well on nonrebreather Extremities warm well perfused Neurological exam Obtunded Does not open eyes to voice and actually actively resists eye opening. Pupils are equal.  There is no gaze deviation or preference.  Face appears symmetric. Spontaneously moves left more than right. To noxious stimulation moves both  Labs I have reviewed labs in epic and the results pertinent to this consultation are:  CBC    Component Value Date/Time   WBC 6.4 01/08/2020 1330   RBC 5.20 01/08/2020 1330   HGB 15.9 01/08/2020 1330   HGB 14.8 05/13/2019 1428   HCT 46.9 01/08/2020 1330   HCT 42.6 05/13/2019 1428   PLT 180 01/08/2020 1330   PLT 207 05/13/2019 1428   MCV 90.2 01/08/2020 1330   MCV 87 05/13/2019 1428   MCH 30.6 01/08/2020 1330   MCHC 33.9 01/08/2020 1330   RDW 12.8 01/08/2020 1330   RDW 13.1 05/13/2019 1428   LYMPHSABS 2.0 05/13/2019 1428   EOSABS 0.1 05/13/2019 1428   BASOSABS 0.0 05/13/2019 1428    CMP     Component Value Date/Time   NA 139 01/08/2020 1330   NA 140 05/13/2019 1428   K 4.2 01/08/2020 1330   CL 106 01/08/2020 1330   CO2 24 01/08/2020 1330   GLUCOSE 119 (H) 01/08/2020 1330   BUN 12 01/08/2020 1330   BUN 12 05/13/2019 1428   CREATININE 1.23 01/08/2020 1330   CALCIUM 8.7 (L) 01/08/2020 1330   PROT 6.2 05/13/2019 1428   ALBUMIN 4.4 05/13/2019 1428   AST 23 05/13/2019 1428   ALT 22 05/13/2019 1428   ALKPHOS 70 05/13/2019 1428   BILITOT 0.3 05/13/2019 1428   GFRNONAA >60 01/08/2020 1330   GFRAA >60 01/08/2020 1330   Imaging I have reviewed the images obtained:  CT-scan of the brain-no acute changes  MRI brain from February 2020 with no acute changes.  Scattered punctate periventricular and subcortical foci of nonspecific gliosis.  Assessment:  69 year old with history of nocturnal seizures with only had 2 seizures in all his life  coming in with a breakthrough seizure this morning. Was being evaluated in the emergency room when he had another seizure lasting about 1 to 2 minutes in the CT scanner when he turned completely purple after having generalized body shaking. Noncontrast head CT unremarkable He is on Keppra 1500 daily at home. Unclear reason for breakthrough seizures-need further investigation  Impression: Breakthrough seizures x2 Status epilepticus  Recommendations: Ativan 2 mg IV x1 stat Keppra 2000 mg stat Stat EEG Seizure precautions Needs MRI brain with and without contrast at some point.  Further recommendations to follow after EEG.  -- Amie Portland, MD [facility] Pager: [phone] If 7pm  to 7am, please call on call as listed on [facility]ION.  CRITICAL CARE ATTESTATION Performed by: Amie Portland, MD Total critical care time: 50 minutes Critical care time was exclusive of separately billable procedures and treating other patients and/or supervising APPs/Residents/Students Critical care was necessary to treat or prevent imminent or life-threatening deterioration due to seizures, status epilepticus  This patient is critically ill and at significant risk for neurological worsening and/or death and care requires constant monitoring. Critical care was time spent personally by me on the following activities: development of treatment plan with patient and/or surrogate as well as nursing, discussions with consultants, evaluation of patient's response to treatment, examination of patient, obtaining history from patient or surrogate, ordering and performing treatments and interventions, ordering and review of laboratory studies, ordering and review of radiographic studies, pulse oximetry, re-evaluation of patient's condition, participation in multidisciplinary rounds and medical decision making of high complexity in the care of this patient.   Addendum EEG unremarkable for ongoing seizure  activity Continue plan as above Neurology will follow -- Amie Portland, MD [facility] Pager: [phone] If 7pm to 7am, please call on call as listed on [facility]ION.

## 2020-01-08 NOTE — Anesthesia Preprocedure Evaluation (Signed)
Anesthesia Evaluation  Patient identified by MRN, date of birth, ID band Patient awake    Airway Mallampati: II  TM Distance: >3 FB     Dental   Pulmonary    breath sounds clear to auscultation       Cardiovascular negative cardio ROS   Rhythm:Regular Rate:Normal     Neuro/Psych  Headaches, Seizures -,  Anxiety Depression  Neuromuscular disease    GI/Hepatic negative GI ROS, Neg liver ROS,   Endo/Other    Renal/GU negative Renal ROS     Musculoskeletal   Abdominal   Peds  Hematology   Anesthesia Other Findings   Reproductive/Obstetrics                             Anesthesia Physical Anesthesia Plan  ASA: III  Anesthesia Plan: General   Post-op Pain Management:    Induction: Intravenous  PONV Risk Score and Plan: 2 and Ondansetron, Dexamethasone and Midazolam  Airway Management Planned: Oral ETT  Additional Equipment:   Intra-op Plan:   Post-operative Plan: Possible Post-op intubation/ventilation  Informed Consent: I have reviewed the patients History and Physical, chart, labs and discussed the procedure including the risks, benefits and alternatives for the proposed anesthesia with the patient or authorized representative who has indicated his/her understanding and acceptance.     Dental advisory given  Plan Discussed with: Anesthesiologist and CRNA  Anesthesia Plan Comments:         Anesthesia Quick Evaluation

## 2020-01-08 NOTE — Significant Event (Signed)
Rapid Response Event Note   Per CT staff patient with seizure activity while on CT table.  Per staff he also turned blue/purple and had difficulty finding a pulse. Upon my arrival patient was postictal.  He has blood in his mouth, He bit his tongue.  Snoring respirations.  Oral suction. Dr Rory Percy at bedside shortly after I arrived.  2mg  Ativan given IV.  Head CT done   ED staff at bedside 1 g Keppra given IV  O2 sats on NRB 96% ST 112 RR 24  Returned to ED EEG done  Raliegh Ip

## 2020-01-08 NOTE — Brief Op Note (Signed)
01/08/2020  10:23 PM  PATIENT:  Benjamin [name]  69 y.o. male  PRE-OPERATIVE DIAGNOSIS:  unstable spine fracture  POST-OPERATIVE DIAGNOSIS:  unstable spine fracture  PROCEDURE:  Procedure(s): THORACIC NINE TO LUMBAR TWO POSTERIOR SPINAL INSTRUMENTED FUSION (N/A)  SURGEON:  Surgeon(s) and Role:    * Hussain Maimone, Joyice Faster, MD - Primary    * Kristeen Miss, MD - Assisting  PHYSICIAN ASSISTANT:   ANESTHESIA:   general  EBL:  600 mL   BLOOD ADMINISTERED:none  DRAINS: #10 flat drain with JP bulb in subfascial space   LOCAL MEDICATIONS USED:  LIDOCAINE   SPECIMEN:  No Specimen  DISPOSITION OF SPECIMEN:  N/A  COUNTS:  YES  TOURNIQUET:  * No tourniquets in log *  DICTATION: .Note written in EPIC  PLAN OF CARE: Admit to inpatient   PATIENT DISPOSITION:  PACU - hemodynamically stable.   Delay start of Pharmacological VTE agent (>24hrs) due to surgical blood loss or risk of bleeding: yes

## 2020-01-08 NOTE — H&P (Addendum)
[facility] Admission History and Physical Service Pager: [phone]  Patient name: Benjamin Benjamin [name] Benjamin [name] Medical record number: [id number] Date of birth: 1951/01/25 Age: 69 y.o. Gender: male  Primary Care Provider: Cyndi Bender, PA-C Consultants: Neurology, Neurosurgery Code Status: Full Preferred Emergency Contact: [name], wife.   Chief Complaint: seizures  Assessment and Plan: Benjamin Benjamin [name] Benjamin [name] is a 69 y.o. male presenting with seizure. PMH is significant for seizures, anxiety, depression. Back pain, peripheral neuropathy  Seizure Activity Benjamin [name] has history of nocturnal seizures (2 reported prior episodes) and is compliant with home medication Keppra. CT head negative for acute intracranial hemorrhage, mass effect or acute infarction.  During the CT he had a generalized tonic clonic seizure, turned blue in the face and CPR was initiated but patient had a pulse and color returned after minimal compressions.  Neuro was consulted in the ED for further evaluation.  Appears as thought Benjamin [name] having breakthrough seizures and he was given Keppra 2000 mg and Ativan  2mg .  EEG was within normal limits.  No seizure activity or epileptiform discharges were seen in the setting of Ativan and Keppra administration.  On exam patient is drowsy but easy to arouse and orientated x4.  Neuro exam benign and no focal deficits.  Rt lateral tongue laceration noted likely from seizure activity.  Does not need stitches.. -Admit Progressive, Attending Dr Gwendlyn Deutscher -Neuro following, appreciate recommendations -Neuro signs q4h -Vital signs per unit -Keppra 2000 mg in ED -Seizure precautions -NPO -SCD's -Ativan prn for seizures -CK -BMP,CBC in am - will need MRI brain   Benjamin Benjamin [name] Benjamin [name] remembers waking up at the bottom of stairs after seizure.  Complains of back pain.  CT Thoracic  spine shows acute coronal oblique fracture through Benjamin Benjamin [name] vertebral body with widening of the  anterior aspect of the disc space at Benjamin Benjamin [name]-12 without neural impingement.  CT Lumbar spine shows acute fractures of the left transverse process of L1,2,3. Old healed fractures of L1 and L5.  Left foraminal stenosis at L5-S1 which could affect the left L5 nerve.  Neurosurgery was consulted in the ED and plan for surgery tonight. -Neurosurgey consulted, appreciated recommendations -NPO, advance diet as tolerated -Bedrest -pain management per NS  FEN/GI:  Prophylaxis:  -SCD's  Disposition: Progressive, Attending Dr. Gwendlyn Deutscher  History of Present Illness:  Benjamin Benjamin [name] is a 69 y.o. male presenting with seizure activity.   Patient reports was changing batteries in alarm and then remembers waking up at the bottom of the stairs.  Has not had amnesia since that time. He reports that was more than five years ago since last seizure and is compliant with his daily medication.  His home medication is Keppra 3 tabs daily.  He reports compliance with this medication denies any headaches, visual changes, dizziness, Chest pain, shortness of breath, nausea, vomiting or abdominal pain.  He denies any urinary incontinence.  He reports having back pain a little lower between his shoulder blades and that he has a birth defect where he has spinal column narrowing.  Denies any ETOH, illicit drug use.    In the ED was  Review Of Systems: Per HPI with the following additions:   Review of Systems  Constitutional: Negative for chills and fever.  Eyes: Negative for blurred vision and double vision.  Cardiovascular: Negative for chest pain and palpitations.  Gastrointestinal: Negative for abdominal pain, constipation, diarrhea, nausea and vomiting.  Musculoskeletal: Positive for back pain.  Neurological: Positive for seizures and loss of  consciousness. Negative for dizziness, sensory change, speech change, focal weakness, weakness and headaches.    Patient Active Problem List   Diagnosis Date Noted  .  Psychophysiological insomnia 08/18/2018  . Psoriatic arthritis ([place]) 08/18/2018  . Partial symptomatic epilepsy with complex partial seizures, not intractable, without status epilepticus ([place]) 08/18/2018  . Subjective memory complaints 08/15/2017  . Seizures ([place]) 08/15/2017  . Peripheral axonal neuropathy 08/06/2014  . Unspecified hereditary and idiopathic peripheral neuropathy 07/07/2014  . Lumbar stenosis with neurogenic claudication 12/23/2013  . Other forms of epilepsy and recurrent seizures without mention of intractable epilepsy 09/16/2013  . Prostate cancer ([place]) 07/03/2012    Past Medical History: Past Medical History:  Diagnosis Date  . Acute prostatitis 07/03/12   s/p prostate   . Allergy   . Anxiety   . Arthritis    stenosis lower back  . Back pain   . BPH (benign prostatic hypertrophy) with urinary obstruction   . Depression   . ED (erectile dysfunction)   . Headache(784.0)    "sinus" headaches  . History of ITP    as a child per patient  . Hypercholesterolemia   . Other forms of epilepsy and recurrent seizures without mention of intractable epilepsy 09/16/2013  . Peripheral axonal neuropathy 08/06/2014  . Prostate cancer ([place]) 07/03/12   Adenocarcinoma,gleason=3+3=6,PSA=5.0,volume=20cc  . Seizures ([place])   . Unspecified hereditary and idiopathic peripheral neuropathy 07/07/2014   Foot numbness starting 2010, hands beginning in 2014- 15 .    Past Surgical History: Past Surgical History:  Procedure Laterality Date  . BACK SURGERY    . COLONOSCOPY    . KNEE ARTHROSCOPY  2017  . LUMBAR LAMINECTOMY/DECOMPRESSION MICRODISCECTOMY N/A 12/23/2013   Procedure: Lumbar Laminectomy Decompression, Lumbar Two, Three, Four, Five;  Surgeon: Hosie Spangle, MD;  Location: [facility];  Service: Neurosurgery;  Laterality: N/A;  . LYMPHADENECTOMY Bilateral 05/25/2016   Procedure: BILATERAL PELVIC LYMPHADENECTOMY;  Surgeon: Alexis Frock, MD;  Location: [facility];  Service:  Urology;  Laterality: Bilateral;  . ROBOT ASSISTED LAPAROSCOPIC RADICAL PROSTATECTOMY N/A 05/25/2016   Procedure: XI ROBOTIC ASSISTED LAPAROSCOPIC RADICAL PROSTATECTOMY WITH INDOCYANINE GREEN DYE;  Surgeon: Alexis Frock, MD;  Location: [facility];  Service: Urology;  Laterality: N/A;  . TOE SURGERY      Social History: Social History   Tobacco Use  . Smoking status: Never Smoker  . Smokeless tobacco: Never Used  Substance Use Topics  . Alcohol use: Yes    Comment: social beer occasionally  . Drug use: No    Comment: chewed in high school short time   Additional social history: none Please also refer to relevant sections of EMR.  Family History: Family History  Problem Relation Age of Onset  . Cancer Father 68       prostate/prostatectomy  . Cancer Cousin 22       prostate ca, also heart problems     Allergies and Medications: Allergies  Allergen Reactions  . Aspartame And Phenylalanine Other (See Comments)    Powder causes seizures  . Benadryl [Diphenhydramine] Other (See Comments)    seizures  . Zoloft [Sertraline Hcl] Other (See Comments)    Confusion Memory loss   No current facility-administered medications on file prior to encounter.   Current Outpatient Medications on File Prior to Encounter  Medication Sig Dispense Refill  . acetaminophen (TYLENOL) 500 MG tablet Take 1,000 mg by mouth every 6 (six) hours as needed for mild pain.    Marland Kitchen alendronate (FOSAMAX) 70 MG  tablet Take 70 mg by mouth once a week.     . levETIRAcetam (KEPPRA) 750 MG tablet Take 2,250 mg by mouth daily.    Marland Kitchen ALPRAZolam (XANAX) 1 MG tablet 0.5 mg at night if not able to sleep- this is for insomnia. (Patient not taking: Reported on 11/19/2019) 30 tablet 0  . cyclobenzaprine (FLEXERIL) 10 MG tablet Take 1 tablet (10 mg total) by mouth 2 (two) times daily as needed for muscle spasms. (Patient not taking: Reported on 11/19/2019) 20 tablet 0  . levETIRAcetam (KEPPRA XR) 500 MG 24 hr tablet TAKE 3  TABLETS BY MOUTH ONCE A DAY BEST AT BEDTIME (Patient not taking: Reported on 01/08/2020) 270 tablet 1    Objective: BP (!) 149/90 (BP Location: Left Arm)   Pulse 100   Temp 98.4 F (36.9 C) (Oral)   Resp 20   Ht 6' (1.829 m)   Wt 102 kg   SpO2 94%   BMI 30.50 kg/m  Exam: General: 70 y.o male drowsy and in no acute distress Eyes: PERRL ENTM: Mucus membranes moist, Rt lateral tongue laceration Cardiovascular: RRR, no murmurs or gallops appreciated Respiratory: CTAB, no crackles or wheezes noted Gastrointestinal: firm, non tender, non distended, BS present Extremities: 5/5 strength in all extremities, trace pitting edema Rt lower leg Neuro: Drowsy but easy to arouse, oriented x3.  CN III-XII intact, motor andsensation normal,  Labs and Imaging: CBC BMET  Recent Labs  Lab 01/08/20 1330  WBC 6.4  HGB 15.9  HCT 46.9  PLT 180   Recent Labs  Lab 01/08/20 1330  NA 139  K 4.2  CL 106  CO2 24  BUN 12  CREATININE 1.23  GLUCOSE 119*  CALCIUM 8.7*     EKG:   DG Thoracolumabar Spine  Result Date: 01/08/2020 CLINICAL DATA:  Intraop thoracolumbar spine fusion EXAM: THORACOLUMBAR SPINE 1V COMPARISON:  None. FINDINGS: Two intraop fluoroscopic guided images were obtained of thoracolumbar spine posterior fusion. Transpedicular screws are noted. Fluoro time 55 seconds IMPRESSION: Intraop views from thoracolumbar spine fusion. Electronically Signed   By: Prudencio Pair M.D.   On: 01/08/2020 22:31   CT HEAD WO CONTRAST  Result Date: 01/08/2020 CLINICAL DATA:  Seizure EXAM: CT HEAD WITHOUT CONTRAST TECHNIQUE: Contiguous axial images were obtained from the base of the skull through the vertex without intravenous contrast. COMPARISON:  2011 CT FINDINGS: Brain: There is no acute intracranial hemorrhage, mass-effect, or edema. Gray-white differentiation is preserved. There is no extra-axial fluid collection. Ventricles and sulci are within normal limits in size and configuration. Patchy  hypoattenuation in the supratentorial white matter is nonspecific may reflect mild chronic microvascular ischemic changes. Vascular: No hyperdense vessel or unexpected calcification. Skull: Calvarium is unremarkable. Sinuses/Orbits: Minor mucosal thickening.  Orbits are unremarkable. Other: None. IMPRESSION: No acute intracranial hemorrhage, mass effect, or evidence of acute infarction. Electronically Signed   By: Macy Mis M.D.   On: 01/08/2020 14:27   CT Thoracic Spine Wo Contrast  Result Date: 01/08/2020 CLINICAL DATA:  Back pain. The patient experienced a seizure while on a ladder at home and fell. EXAM: CT THORACIC AND LUMBAR SPINE WITHOUT CONTRAST TECHNIQUE: Multidetector CT imaging of the thoracic and lumbar spine was performed without contrast. Multiplanar CT image reconstructions were also generated. COMPARISON:  Lumbar radiographs dated 03/30/2019 and CT scan of the thoracic spine dated 10/06/2013 and CT scan of the chest dated 12/08/2018 FINDINGS: CT THORACIC SPINE FINDINGS Alignment: Normal. Vertebrae: There is an acute coronal oblique fracture through the  Benjamin Benjamin [name] vertebral body. The fracture does not involve the pedicles or lamina or spinous process. The fracture does extend through the Benjamin Benjamin [name]-12 disc space with widening of the disc space. There are old wedge deformities of T6 and of the superior endplate of 624THL and L1. Chronic fusion of the T12-L1 level by osteophytes. Paraspinal and other soft tissues: Slight soft tissue stranding at Benjamin Benjamin [name] secondary to hemorrhage from the fracture. No hematoma. Disc levels: C7-T1: Osteophytes fuse the vertebral bodies. No disc bulging or protrusion. T1-2 through T10-11: No significant abnormality. Benjamin Benjamin [name]-12: Fracture through the disc space with abnormal widening of the anterior aspect of the disc space with a coronal oblique fracture through the Benjamin Benjamin [name] vertebral body. No displacement of bone fragments. No evidence of hematoma in the spinal canal. Slight paraspinal soft  tissue stranding around the Benjamin Benjamin [name] vertebral body consistent with slight hemorrhage. T12-L1: Chronic fusion of the vertebra by bridging osteophytes. Multiple note is made of multiple old healed right posterolateral rib fractures at the costovertebral joints. These are not acute. CT LUMBAR SPINE FINDINGS Segmentation: 5 lumbar type vertebrae. Alignment: Normal. Vertebrae: Old healed compression fractures of L1 and L5. The L5 fracture has been treated with vertebroplasty. Solid posterior fusion at L4-5. Chronic ankylosis of the T12 and L1 vertebra. Acute fracture or of Benjamin Benjamin [name] extending through the Benjamin Benjamin [name]-12 disc space as described above. Acute fractures of the left transverse processes of L1, L2, and L3. Paraspinal and other soft tissues: No acute abnormality of the paraspinal soft tissues in the lumbar spine. Disc levels: T12-L1: No disc bulging or protrusion. Old healed fracture of the superior endplate of L1. Osteophytes fuse the Benjamin Benjamin [name] and T12 vertebra. L1-2: No significant abnormality. L2-3: Tiny broad-based disc bulge with no neural impingement. Moderate bilateral facet arthritis. Posterior decompression. Resection of the spinous process. L3-4: Normal disc. Previous resection of the spinous process. Moderate arthritis of the facet joints. L4-5: Small broad-based bulge of the disc. Old healed compression fracture of the superior aspect of L5. Fusion of the facet joints. Resection of the spinous processes. L5-S1: Tiny central disc bulge without impingement. Severe bilateral facet arthritis. Slight narrowing of the spinal canal. Left foraminal impingement which could affect the left L5 nerve, best seen on image 49 of series 10. IMPRESSION: CT THORACIC SPINE 1. Acute coronal oblique fracture through the Benjamin Benjamin [name] vertebral body with widening of the anterior aspect of the disc space at Benjamin Benjamin [name]-12 without neural impingement. 2. No other acute abnormality of the thoracic spine. Osteophytes fuse the thoracic spine from T5 to Benjamin Benjamin [name]. CT LUMBAR  SPINE 1. Acute fractures of the left transverse processes of L1, L2, and L3. Old healed compression fractures of L1 and L5. 2. Left foraminal stenosis at L5-S1 which could affect the left L5 nerve. Electronically Signed   By: Lorriane Shire M.D.   On: 01/08/2020 14:46   CT Lumbar Spine Wo Contrast  Result Date: 01/08/2020 CLINICAL DATA:  Back pain. The patient experienced a seizure while on a ladder at home and fell. EXAM: CT THORACIC AND LUMBAR SPINE WITHOUT CONTRAST TECHNIQUE: Multidetector CT imaging of the thoracic and lumbar spine was performed without contrast. Multiplanar CT image reconstructions were also generated. COMPARISON:  Lumbar radiographs dated 03/30/2019 and CT scan of the thoracic spine dated 10/06/2013 and CT scan of the chest dated 12/08/2018 FINDINGS: CT THORACIC SPINE FINDINGS Alignment: Normal. Vertebrae: There is an acute coronal oblique fracture through the Benjamin Benjamin [name] vertebral body. The fracture does not involve the pedicles or lamina or spinous process. The fracture does  extend through the Benjamin Benjamin [name]-12 disc space with widening of the disc space. There are old wedge deformities of T6 and of the superior endplate of 624THL and L1. Chronic fusion of the T12-L1 level by osteophytes. Paraspinal and other soft tissues: Slight soft tissue stranding at Benjamin Benjamin [name] secondary to hemorrhage from the fracture. No hematoma. Disc levels: C7-T1: Osteophytes fuse the vertebral bodies. No disc bulging or protrusion. T1-2 through T10-11: No significant abnormality. Benjamin Benjamin [name]-12: Fracture through the disc space with abnormal widening of the anterior aspect of the disc space with a coronal oblique fracture through the Benjamin Benjamin [name] vertebral body. No displacement of bone fragments. No evidence of hematoma in the spinal canal. Slight paraspinal soft tissue stranding around the Benjamin Benjamin [name] vertebral body consistent with slight hemorrhage. T12-L1: Chronic fusion of the vertebra by bridging osteophytes. Multiple note is made of multiple old healed right  posterolateral rib fractures at the costovertebral joints. These are not acute. CT LUMBAR SPINE FINDINGS Segmentation: 5 lumbar type vertebrae. Alignment: Normal. Vertebrae: Old healed compression fractures of L1 and L5. The L5 fracture has been treated with vertebroplasty. Solid posterior fusion at L4-5. Chronic ankylosis of the T12 and L1 vertebra. Acute fracture or of Benjamin Benjamin [name] extending through the Benjamin Benjamin [name]-12 disc space as described above. Acute fractures of the left transverse processes of L1, L2, and L3. Paraspinal and other soft tissues: No acute abnormality of the paraspinal soft tissues in the lumbar spine. Disc levels: T12-L1: No disc bulging or protrusion. Old healed fracture of the superior endplate of L1. Osteophytes fuse the Benjamin Benjamin [name] and T12 vertebra. L1-2: No significant abnormality. L2-3: Tiny broad-based disc bulge with no neural impingement. Moderate bilateral facet arthritis. Posterior decompression. Resection of the spinous process. L3-4: Normal disc. Previous resection of the spinous process. Moderate arthritis of the facet joints. L4-5: Small broad-based bulge of the disc. Old healed compression fracture of the superior aspect of L5. Fusion of the facet joints. Resection of the spinous processes. L5-S1: Tiny central disc bulge without impingement. Severe bilateral facet arthritis. Slight narrowing of the spinal canal. Left foraminal impingement which could affect the left L5 nerve, best seen on image 49 of series 10. IMPRESSION: CT THORACIC SPINE 1. Acute coronal oblique fracture through the Benjamin Benjamin [name] vertebral body with widening of the anterior aspect of the disc space at Benjamin Benjamin [name]-12 without neural impingement. 2. No other acute abnormality of the thoracic spine. Osteophytes fuse the thoracic spine from T5 to Benjamin Benjamin [name]. CT LUMBAR SPINE 1. Acute fractures of the left transverse processes of L1, L2, and L3. Old healed compression fractures of L1 and L5. 2. Left foraminal stenosis at L5-S1 which could affect the left L5 nerve.  Electronically Signed   By: Lorriane Shire M.D.   On: 01/08/2020 14:46   DG C-Arm 1-60 Min  Result Date: 01/08/2020 CLINICAL DATA:  Intraop thoracolumbar spine fusion EXAM: THORACOLUMBAR SPINE 1V COMPARISON:  None. FINDINGS: Two intraop fluoroscopic guided images were obtained of thoracolumbar spine posterior fusion. Transpedicular screws are noted. Fluoro time 55 seconds IMPRESSION: Intraop views from thoracolumbar spine fusion. Electronically Signed   By: Prudencio Pair M.D.   On: 01/08/2020 22:31    Carollee Leitz, MD 01/08/2020, 10:46 PM PGY-1, [facility] pager: [phone], text pages welcome  Resident Addendum I have separately seen and examined the patient.  I have discussed the findings and exam with the resident and agree with the above note.  I helped develop the management plan that is described in the resident's note and I agree with the  content.  Changes have been made in BLUE.    Addison Naegeli, MD PGY-2 [facility] residency program

## 2020-01-08 NOTE — Anesthesia Procedure Notes (Signed)
Procedure Name: Intubation Date/Time: 01/08/2020 7:27 PM Performed by: Claris Che, CRNA Pre-anesthesia Checklist: Patient identified, Emergency Drugs available, Suction available, Patient being monitored and Timeout performed Patient Re-evaluated:Patient Re-evaluated prior to induction Oxygen Delivery Method: Circle system utilized Preoxygenation: Pre-oxygenation with 100% oxygen Induction Type: IV induction, Rapid sequence and Cricoid Pressure applied Laryngoscope Size: Mac and 4 Grade View: Grade III Tube type: Oral Tube size: 7.5 mm Number of attempts: 1 Airway Equipment and Method: Stylet Placement Confirmation: ETT inserted through vocal cords under direct vision,  positive ETCO2 and breath sounds checked- equal and bilateral Secured at: 24 cm Dental Injury: Teeth and Oropharynx as per pre-operative assessment

## 2020-01-08 NOTE — Progress Notes (Signed)
EEG complete - results pending 

## 2020-01-08 NOTE — Consult Note (Signed)
Neurosurgery Consultation  Reason for Consult: Thoracic spine fracture Referring Physician: Gwendlyn Deutscher  CC: Back pain  HPI: This is a 69 y.o. man that presents after a seizure while on a ladder, thus falling off the ladder. He had a second GTC in the ED while in CT, reportedly had some cyanosis and a code was called, but was never pulseless and recovered without incident. Since the fall, he has had severe mid-back pain, has baseline lower extremity radicular-distribution numbness that is unchanged from known lumbar stenosis, is currently having some urinary urgency but no new numbness/weakness/paresthesias in upper or lower extremities. No recent use of anti-platelet or anti-coagulant medications.   ROS: A 14 point ROS was performed and is negative except as noted in the HPI.   PMHx:  Past Medical History:  Diagnosis Date  . Acute prostatitis 07/03/12   s/p prostate   . Allergy   . Anxiety   . Arthritis    stenosis lower back  . Back pain   . BPH (benign prostatic hypertrophy) with urinary obstruction   . Depression   . ED (erectile dysfunction)   . Headache(784.0)    "sinus" headaches  . History of ITP    as a child per patient  . Hypercholesterolemia   . Other forms of epilepsy and recurrent seizures without mention of intractable epilepsy 09/16/2013  . Peripheral axonal neuropathy 08/06/2014  . Prostate cancer ([place]) 07/03/12   Adenocarcinoma,gleason=3+3=6,PSA=5.0,volume=20cc  . Seizures ([place])   . Unspecified hereditary and idiopathic peripheral neuropathy 07/07/2014   Foot numbness starting 2010, hands beginning in 2014- 15 .   FamHx:  Family History  Problem Relation Age of Onset  . Cancer Father 98       prostate/prostatectomy  . Cancer Cousin 38       prostate ca, also heart problems   SocHx:  reports that he has never smoked. He has never used smokeless tobacco. He reports current alcohol use. He reports that he does not use drugs.  Exam: Vital signs in last 24  hours: Temp:  [98.6 F (37 C)] 98.6 F (37 C) (02/26 1321) Pulse Rate:  [79-112] 104 (02/26 1430) Resp:  [13-24] 22 (02/26 1430) BP: (132-156)/(90-105) 154/99 (02/26 1430) SpO2:  [91 %-100 %] 100 % (02/26 1430) Weight:  [102 kg] 102 kg (02/26 1322) General: Awake, alert, cooperative, lying in bed, appears uncomfortable Head: normocephalic and atruamatic HEENT: neck supple, dried blood around lips Pulmonary: breathing room air comfortably, no evidence of increased work of breathing Cardiac: RRR Abdomen: S NT ND Extremities: warm and well perfused x4 Neuro: AOx3, Strength 5/5 x4, SILTx4 except for some L5 distribution numbness L>R   Assessment and Plan: 69 y.o. man s/p fall off ladder 2/2 Sz w/ known epilepsy. CT T/L-spine personally reviewed, which show DISH changes / ankylosis, prior L5 and L1 frx, new coronally-oriented fracture through the body of T12 that extends into the T11-12 disc space with fish-mouthing of the disc space, fractures of the L1-L3 TPs on the left.  -unstable fracture pattern, will require operative fixation / stabilization, discussed with the patient regarding risks/benefits/alternatives, he would like to proceed -NPO, 2h COVID test sent -pt complaining of urinary urgency, please place foley catheter    Judith Part, MD 01/08/20 5:00 PM [facility]

## 2020-01-08 NOTE — Op Note (Signed)
PATIENT: Benjamin [name]  DAY OF SURGERY: 01/08/20   PRE-OPERATIVE DIAGNOSIS:  Unstable closed fracture of the thoracic spine   POST-OPERATIVE DIAGNOSIS:  Unstable closed fracture of the thoracic spine   PROCEDURE:  T9-L2 posterior spinal instrumented fusion   SURGEON:  Surgeon(s) and Role:    Judith Part, MD - Primary   ANESTHESIA: ETGA   BRIEF HISTORY: This is a 69 year old man who presented with severe back pain after a fall off a ladder secondary to a seizure. The patient was found to have DISH changes with a severe fracture at T11 / T11-T12 with an unstable fracture pattern. I therefore recommended surgical stabilization via a posterior instrumented fusion. This was discussed with the patient and his wife as well as risks, benefits, and alternatives and wished to proceed with surgery.   OPERATIVE DETAIL: The patient was taken to the operating room and anesthesia was induced by the anesthesia team. They were placed on the OR table in the prone position with padding of all pressure points. A formal time out was performed with two patient identifiers and confirmed the operative site. The operative site was marked, hair was clipped with surgical clippers, the area was then prepped and draped in a sterile fashion. Fluoro was used to localize the operative level and a midline incision was placed to expose from T9 to L2. Subperiosteal dissection was performed bilaterally and fluoroscopy was again used to confirm the surgical level.   Instrumentation was then performed. Fluoroscopy was used to guide placement of bilateral pedicle screws (Medtronic) at T9, T10, T11, T12, L1, and L2. These were placed by localizing the pedicle with standard landmarks, drilling a pilot hole, cannulating the pedicle with an awl, palpating for pedicle wall breaches, tapping, palpating, and then placing the screw. These were connected with rods bilaterally and final tightened according to manufacturer torque  specifications. The bone was thoroughly decorticated over the fusion surface and the previously resected bone fragments were morselized and used as autograft with the addition of Magnafuse allograft (Medtronic).   A drain was placed subfascially, tunneled, and secured. All instrument and sponge counts were correct, the incision was then closed in layers. The patient was then returned to anesthesia for emergence. No apparent complications at the completion of the procedure.   EBL:  558mL   DRAINS: none   SPECIMENS: none   Judith Part, MD 01/08/20 5:58 PM

## 2020-01-09 DIAGNOSIS — S22089A Unspecified fracture of T11-T12 vertebra, initial encounter for closed fracture: Secondary | ICD-10-CM

## 2020-01-09 LAB — CBC WITH DIFFERENTIAL/PLATELET
Abs Immature Granulocytes: 0.07 10*3/uL (ref 0.00–0.07)
Basophils Absolute: 0 10*3/uL (ref 0.0–0.1)
Basophils Relative: 0 %
Eosinophils Absolute: 0 10*3/uL (ref 0.0–0.5)
Eosinophils Relative: 0 %
HCT: 39.5 % (ref 39.0–52.0)
Hemoglobin: 13.3 g/dL (ref 13.0–17.0)
Immature Granulocytes: 1 %
Lymphocytes Relative: 5 %
Lymphs Abs: 0.7 10*3/uL (ref 0.7–4.0)
MCH: 30.4 pg (ref 26.0–34.0)
MCHC: 33.7 g/dL (ref 30.0–36.0)
MCV: 90.4 fL (ref 80.0–100.0)
Monocytes Absolute: 0.6 10*3/uL (ref 0.1–1.0)
Monocytes Relative: 5 %
Neutro Abs: 11.2 10*3/uL — ABNORMAL HIGH (ref 1.7–7.7)
Neutrophils Relative %: 89 %
Platelets: 162 10*3/uL (ref 150–400)
RBC: 4.37 MIL/uL (ref 4.22–5.81)
RDW: 12.9 % (ref 11.5–15.5)
WBC: 12.6 10*3/uL — ABNORMAL HIGH (ref 4.0–10.5)
nRBC: 0 % (ref 0.0–0.2)

## 2020-01-09 LAB — COMPREHENSIVE METABOLIC PANEL
ALT: 22 U/L (ref 0–44)
AST: 25 U/L (ref 15–41)
Albumin: 3.5 g/dL (ref 3.5–5.0)
Alkaline Phosphatase: 42 U/L (ref 38–126)
Anion gap: 9 (ref 5–15)
BUN: 11 mg/dL (ref 8–23)
CO2: 23 mmol/L (ref 22–32)
Calcium: 8.1 mg/dL — ABNORMAL LOW (ref 8.9–10.3)
Chloride: 105 mmol/L (ref 98–111)
Creatinine, Ser: 1.07 mg/dL (ref 0.61–1.24)
GFR calc Af Amer: >60 mL/min (ref >60)
GFR calc non Af Amer: >60 mL/min (ref >60)
Glucose, Bld: 190 mg/dL — ABNORMAL HIGH (ref 70–99)
Potassium: 4 mmol/L (ref 3.5–5.1)
Sodium: 137 mmol/L (ref 135–145)
Total Bilirubin: 0.8 mg/dL (ref 0.3–1.2)
Total Protein: 5.7 g/dL — ABNORMAL LOW (ref 6.5–8.1)

## 2020-01-09 LAB — CBC
HCT: 39.4 % (ref 39.0–52.0)
Hemoglobin: 13.4 g/dL (ref 13.0–17.0)
MCH: 30.5 pg (ref 26.0–34.0)
MCHC: 34 g/dL (ref 30.0–36.0)
MCV: 89.5 fL (ref 80.0–100.0)
Platelets: 155 10*3/uL (ref 150–400)
RBC: 4.4 MIL/uL (ref 4.22–5.81)
RDW: 12.7 % (ref 11.5–15.5)
WBC: 9.6 10*3/uL (ref 4.0–10.5)
nRBC: 0 % (ref 0.0–0.2)

## 2020-01-09 MED ORDER — CHLORHEXIDINE GLUCONATE CLOTH 2 % EX PADS: 6.0000 | MEDICATED_PAD | Freq: Every day | CUTANEOUS | Status: DC

## 2020-01-09 MED ORDER — HYDROMORPHONE HCL 1 MG/ML IJ SOLN: 1.0000 mg | INTRAMUSCULAR | Status: DC | PRN

## 2020-01-09 MED ORDER — ORAL CARE MOUTH RINSE
15.0000 mL | Freq: Two times a day (BID) | OROMUCOSAL | Status: DC
  Administered 2020-01-09 – 2020-01-11 (×3): 15 mL via OROMUCOSAL

## 2020-01-09 MED ORDER — OXYCODONE HCL 5 MG PO TABS
5.0000 mg | ORAL_TABLET | ORAL | Status: DC | PRN
  Administered 2020-01-09 – 2020-01-11 (×10): 10 mg via ORAL
  Filled 2020-01-09 (×11): qty 2

## 2020-01-09 MED ORDER — LEVETIRACETAM 500 MG PO TABS
1000.0000 mg | ORAL_TABLET | Freq: Two times a day (BID) | ORAL | Status: DC
  Administered 2020-01-09 – 2020-01-11 (×5): 1000 mg via ORAL
  Filled 2020-01-09 (×5): qty 2

## 2020-01-09 MED ORDER — LEVETIRACETAM 750 MG PO TABS: 750.0000 mg | ORAL_TABLET | Freq: Two times a day (BID) | ORAL | Status: DC

## 2020-01-09 NOTE — Evaluation (Signed)
Physical Therapy Evaluation Patient Details Name: Benjamin [name] MRN: [id number] DOB: 07/02/1951 Today's Date: 01/09/2020   History of Present Illness  Patient is a 69 year old male who was up on stepladder changing out batteries in smoke detector fell due to seizure/fell and had seizure. CT: prior L5 and L1 frx, new coronally-oriented fracture through the body of T12 that extends into the T11-12 disc space with fish-mouthing of the disc space, fractures of the L1-L3 TPs on the left.Pt now s/pT9-L2 posterior spinal instrumented fusion. PMH Seizueres, depression, prostate cancer, BPH, ( )  Clinical Impression  Patient required mod a for bed mobility and min a for sit to stand transfer. He was very active prior to his surgery. As of today he may benefit most from rehab at a SNF to improve mobility. If his mobility improves with therapy he may be able to go home with home health and 24 hour supervision. Therapy will continue to review BLT precautions with the patient. He would benefit from skilled acute therapy to improve bed mobility and improve ability to ambulate.     Follow Up Recommendations SNF;Home health PT    Equipment Recommendations  Rolling walker with 5" wheels    Recommendations for Other Services Rehab consult     Precautions / Restrictions Precautions Precautions: Back Precaution Booklet Issued: Yes (comment) Precaution Comments: BLT percautions (original note said no brace new note from Dr Saintclair Halsted says TLSO) Required Braces or Orthoses: (per MD note patient does not need a brace: see above) Restrictions Weight Bearing Restrictions: No      Mobility  Bed Mobility Overal bed mobility: Needs Assistance Bed Mobility: Sidelying to Sit;Rolling Rolling: Mod assist Sidelying to sit: Mod assist       General bed mobility comments: mod A and mod VC/TC for poper log role. Patient hesitant at first to come to a full sitting position but was able to achieve with enccouragement and  cuing for breathing.   Transfers Overall transfer level: Needs assistance Equipment used: Rolling walker (2 wheeled) Transfers: Sit to/from Stand Sit to Stand: Min assist         General transfer comment: Min a to stand, Once standing SBA for balance   Ambulation/Gait Ambulation/Gait assistance: Min guard Gait Distance (Feet): 8 Feet Assistive device: Rolling walker (2 wheeled) Gait Pattern/deviations: Step-to pattern Gait velocity: decreased Gait velocity interpretation: <1.31 ft/sec, indicative of household ambulator General Gait Details: slow step to gait pattenr; moderate pain reported. Improved pain with cuing for breathing.   Stairs            Wheelchair Mobility    Modified Rankin (Stroke Patients Only)       Balance Overall balance assessment: Needs assistance Sitting-balance support: Bilateral upper extremity supported;Feet supported Sitting balance-Leahy Scale: Fair Sitting balance - Comments: used hands to take pressure off his back   Standing balance support: Bilateral upper extremity supported Standing balance-Leahy Scale: Poor Standing balance comment: uses UE and walker for balance                              Pertinent Vitals/Pain Pain Assessment: Faces Faces Pain Scale: Hurts even more Pain Location: lumbar spine  Pain Descriptors / Indicators: Aching;Guarding;Grimacing Pain Intervention(s): Limited activity within patient's tolerance;Monitored during session;Patient requesting pain meds-RN notified;Utilized relaxation techniques    Home Living Family/patient expects to be discharged to:: Private residence Living Arrangements: Spouse/significant other   Type of Home: House Home Access: Stairs  to enter   Entrance Stairs-Number of Steps: 3 Home Layout: Two level   Additional Comments: Patient has steps but his room and bathroom on on the first floor     Prior Function Level of Independence: Independent          Comments: Patient was very active. he reports the day before his fall he went 10 miles on an exercise bike     Hand Dominance   Dominant Hand: Right    Extremity/Trunk Assessment   Upper Extremity Assessment Upper Extremity Assessment: Defer to OT evaluation    Lower Extremity Assessment Lower Extremity Assessment: Generalized weakness    Cervical / Trunk Assessment Cervical / Trunk Assessment: Normal  Communication   Communication: No difficulties  Cognition Arousal/Alertness: Awake/alert Behavior During Therapy: WFL for tasks assessed/performed Overall Cognitive Status: Within Functional Limits for tasks assessed                                        General Comments      Exercises     Assessment/Plan    PT Assessment Patient needs continued PT services  PT Problem List Decreased strength;Decreased range of motion;Decreased activity tolerance;Decreased mobility;Decreased knowledge of use of DME;Decreased knowledge of precautions;Pain       PT Treatment Interventions DME instruction;Gait training;Stair training;Functional mobility training;Therapeutic activities;Therapeutic exercise;Neuromuscular re-education;Patient/family education    PT Goals (Current goals can be found in the Care Plan section)  Acute Rehab PT Goals Patient Stated Goal: to have less pain and improve ability to walk  PT Goal Formulation: With patient Time For Goal Achievement: 01/16/20 Potential to Achieve Goals: Good    Frequency Min 5X/week   Barriers to discharge        Co-evaluation               AM-PAC PT "6 Clicks" Mobility  Outcome Measure Help needed turning from your back to your side while in a flat bed without using bedrails?: A Lot Help needed moving from lying on your back to sitting on the side of a flat bed without using bedrails?: A Lot Help needed moving to and from a bed to a chair (including a wheelchair)?: A Little Help needed standing up  from a chair using your arms (e.g., wheelchair or bedside chair)?: A Little Help needed to walk in hospital room?: A Lot Help needed climbing 3-5 steps with a railing? : A Lot 6 Click Score: 14    End of Session Equipment Utilized During Treatment: Gait belt Activity Tolerance: Patient limited by pain Patient left: in chair;with call bell/phone within reach Nurse Communication: Mobility status PT Visit Diagnosis: Unsteadiness on feet (R26.81)    Time: [id number] PT Time Calculation (min) (ACUTE ONLY): 32 min   Charges:   PT Evaluation $PT Eval Moderate Complexity: 1 Mod           Carney Living PT DPT  01/09/2020, 10:47 AM

## 2020-01-09 NOTE — Progress Notes (Signed)
Family Medicine Teaching Service Daily Progress Note Intern Pager: [phone]  Patient name: Benjamin [name] Medical record number: [id number] Date of birth: 03-03-1951 Age: 69 y.o. Gender: male  Primary Care Provider: Cyndi Bender, PA-C Consultants: Neurology, Neurosurgery Code Status: Full  Pt Overview and Major Events to Date:  Benjamin [name] is a 69 y.o. male presenting with seizure. PMH is significant for seizures, anxiety, depression. Back pain, peripheral neuropathy. Presented to the ED after a fall from a ladder due to breakthrough seizure. Found to have multiple unstable vertebral body fractures from T9-L2 and had multilevel fixation.  Assessment and Plan:  Seizure Activity Acute on Chronic. Patient states he has been complaint on home Keppra dosing of 750mg  BID for years and has not had any recent seizure activity. History of seizures at night time mostly. Unclear what caused this most recent episode. CT head negative for acute intracranial hemorrhage, mass effect or acute infarction. EEG was within normal limits. Neuro exam benign and no focal deficits.  -Neuro recs: Increasing Keppra to 1000mg  BID daily, follow up in 2-4 weeks, needs brain MRI w/wo contrast but can be done outpatient - Cont Neuro signs q4h - Cont Vital signs per unit - Cont Seizure precautions - SCD's for today and then can switch to  - Ativan prn for seizures lasting longer than1-2 min - Per Neruology he will need a brain MRI w/wo contrast but this can be done outpatient   T11 fracture secondary to fall from seizure Acute. Improved. Patient has surgical fixation and stabilization of T9-L2. Drain in place with good output of 50mL.  - Neurosurgey recs: remove drain 01/10/2020; f/u outpatient - Advance diet as tolerated, activity as tolerated, no brace or spine precautions needed - Oxycodone 5-10mg  q4prn for moderate pain, Dilaudid 1mg  IV q2prn for severe pain   FEN/GI: Normal diet PPx: SCDs  Disposition:  Med-Surg  Subjective:  Patient is awake and alert sitting up in bed. He does have some mild back pain but he has not had any medication for pain. I reminded him he has pain medication on board and to ask for it if the pain gets worse. He is ready to work with PT/OT today, no more seizures and he feels well.   Objective: Temp:  [97.5 F (36.4 C)-99 F (37.2 C)] 97.5 F (36.4 C) (02/27 0410) Pulse Rate:  [79-112] 82 (02/27 0410) Resp:  [9-24] 18 (02/27 0410) BP: (110-156)/(71-105) 120/77 (02/27 0410) SpO2:  [88 %-100 %] 95 % (02/27 0410) Weight:  [102 kg] 102 kg (02/26 1322) Physical Exam: Gen: Alert and Oriented x 3, NAD CV: RRR, no murmurs, normal S1, S2 split Resp: CTAB, no wheezing, rales, or rhonchi, comfortable work of breathing Abd: non-distended, non-tender, soft, +bs in all four quadrants Ext: SCDs in place on bilateral lower extremities Skin: warm, dry, intact, no rashes  Laboratory: Recent Labs  Lab 01/08/20 1330 01/09/20 0253  WBC 6.4 9.6  HGB 15.9 13.4  HCT 46.9 39.4  PLT 180 155   Recent Labs  Lab 01/08/20 1330 01/09/20 0253  NA 139 137  K 4.2 4.0  CL 106 105  CO2 24 23  BUN 12 11  CREATININE 1.23 1.07  CALCIUM 8.7* 8.1*  PROT  --  5.7*  BILITOT  --  0.8  ALKPHOS  --  42  ALT  --  22  AST  --  25  GLUCOSE 119* 190*   CK: 288 COVID: negative HIV: negative  Imaging/Diagnostic Tests: CT Head non-con:  IMPRESSION:  No acute intracranial hemorrhage, mass effect, or evidence of acute Infarction. CT Thoracic and Lumbar Spine: 1. Acute coronal oblique fracture through the T11 vertebral body with widening of the anterior aspect of the disc space at T11-12 without neural impingement. 2. No other acute abnormality of the thoracic spine. Osteophytes fuse the thoracic spine from T5 to T11. 1. Acute fractures of the left transverse processes of L1, L2, and L3. Old healed compression fractures of L1 and L5. 2. Left foraminal stenosis at L5-S1 which  could affect the left L5 nerve.  Nuala Alpha, DO 01/09/2020, 8:06 AM PGY-3, [facility] Intern pager: [phone], text pages welcome

## 2020-01-09 NOTE — Evaluation (Signed)
Occupational Therapy Evaluation Patient Details Name: Benjamin [name] MRN: [id number] DOB: Nov 20, 1950 Today's Date: 01/09/2020    History of Present Illness Patient is a 69 year old male who was up on stepladder changing out batteries in smoke detector fell due to seizure/fell and had seizure. CT: prior L5 and L1 frx, new coronally-oriented fracture through the body of T12 that extends into the T11-12 disc space with fish-mouthing of the disc space, fractures of the L1-L3 TPs on the left.Pt now s/pT9-L2 posterior spinal instrumented fusion. PMH Seizueres, depression, prostate cancer, BPH,    Clinical Impression   This 68 yo male admitted and underwent above presents to acute OT with PLOF of being totally independent with basic ADLs and IADLs. Now pt is Mod-total A for most basic ADLs due to pain, decreased balance, and decrease mobility. He will benefit from acute OT with follow up OT on CIR.    Follow Up Recommendations  CIR;Supervision/Assistance - 24 hour    Equipment Recommendations  Other (comment)(TBD next venue)    Recommendations for Other Services Rehab consult     Precautions / Restrictions Precautions Precautions: Back(?) Required Braces or Orthoses: (Per surgical MD note pt does not need brace nor have back precautions; there is on in room ordered by rounding MD on 01/08/2019) Restrictions Weight Bearing Restrictions: No      Mobility Bed Mobility Overal bed mobility: Needs Assistance Bed Mobility: Sit to Sidelying;Rolling Rolling: Min assist(side lying to supine)       Sit to sidelying: Min assist General bed mobility comments: total A to scoot shoulders laterally in bed  Transfers Overall transfer level: Needs assistance Equipment used: Rolling walker (2 wheeled) Transfers: Sit to/from [facility] Sit to Stand: Min assist Stand pivot transfers: Min assist            Balance Overall balance assessment: Needs assistance Sitting-balance  support: No upper extremity supported;Feet supported Sitting balance-Leahy Scale: Fair     Standing balance support: Bilateral upper extremity supported Standing balance-Leahy Scale: Poor Standing balance comment: walker and additional external A for balance due to pain                           ADL either performed or assessed with clinical judgement   ADL Overall ADL's : Needs assistance/impaired Eating/Feeding: Independent;Sitting   Grooming: Set up;Sitting   Upper Body Bathing: Minimal assistance;Sitting   Lower Body Bathing: Maximal assistance Lower Body Bathing Details (indicate cue type and reason): min A sit<>stand from recliner Upper Body Dressing : Moderate assistance;Sitting   Lower Body Dressing: Total assistance Lower Body Dressing Details (indicate cue type and reason): min A sit<>stand Toilet Transfer: Minimal assistance;Stand-pivot;RW Toilet Transfer Details (indicate cue type and reason): recliner to bed Toileting- Clothing Manipulation and Hygiene: Total assistance Toileting - Clothing Manipulation Details (indicate cue type and reason): min A sit<>stand             Vision Patient Visual Report: No change from baseline              Pertinent Vitals/Pain Pain Assessment: Faces Faces Pain Scale: Hurts even more Pain Location: back Pain Descriptors / Indicators: Aching;Guarding;Grimacing;Moaning Pain Intervention(s): Limited activity within patient's tolerance;Monitored during session;Repositioned     Hand Dominance Right   Extremity/Trunk Assessment Upper Extremity Assessment Upper Extremity Assessment: Generalized weakness           Communication Communication Communication: No difficulties   Cognition Arousal/Alertness: Lethargic;Suspect due to medications Behavior  During Therapy: WFL for tasks assessed/performed Overall Cognitive Status: Within Functional Limits for tasks assessed                                                 Home Living Family/patient expects to be discharged to:: Private residence Living Arrangements: Spouse/significant other Available Help at Discharge: Family;Available 24 hours/day Type of Home: House Home Access: Stairs to enter [employer] of Steps: 3   Home Layout: Two level Alternate Level Stairs-Number of Steps: 12   Bathroom Shower/Tub: Tub/shower unit;Curtain   Bathroom Toilet: Handicapped height         Additional Comments: Patient has steps but his room and bathroom on on the first floor       Prior Functioning/Environment Level of Independence: Independent        Comments: Patient was very active. he reports the day before his fall he went 10 miles on an exercise bike        OT Problem List: Decreased strength;Decreased range of motion;Impaired balance (sitting and/or standing);Pain      OT Treatment/Interventions: Self-care/ADL training;DME and/or AE instruction;Patient/family education;Balance training    OT Goals(Current goals can be found in the care plan section) Acute Rehab OT Goals Patient Stated Goal: lessen pain OT Goal Formulation: With patient Time For Goal Achievement: 01/23/20 Potential to Achieve Goals: Good  OT Frequency: Min 3X/week              AM-PAC OT "6 Clicks" Daily Activity     Outcome Measure Help from another person eating meals?: None Help from another person taking care of personal grooming?: A Little Help from another person toileting, which includes using toliet, bedpan, or urinal?: A Lot Help from another person bathing (including washing, rinsing, drying)?: A Lot Help from another person to put on and taking off regular upper body clothing?: A Lot Help from another person to put on and taking off regular lower body clothing?: Total 6 Click Score: 14   End of Session Equipment Utilized During Treatment: Gait belt;Rolling walker Nurse Communication: (how to donn TLSO)  Activity  Tolerance: Patient limited by pain Patient left: in bed;with call bell/phone within reach;with bed alarm set  OT Visit Diagnosis: Unsteadiness on feet (R26.81);Other abnormalities of gait and mobility (R26.89);Muscle weakness (generalized) (M62.81);Pain Pain - part of body: (back)                Time: [id number] OT Time Calculation (min): 21 min Charges:  OT General Charges $OT Visit: 1 Visit OT Evaluation $OT Eval Moderate Complexity: 1 Mod  CathyOTR/L Acute Rehab Services Pager [phone] Office [phone]     01/09/2020, 8:55 PM

## 2020-01-09 NOTE — Anesthesia Postprocedure Evaluation (Signed)
Anesthesia Post Note  Patient: Benjamin [name]  Procedure(s) Performed: THORACIC NINE TO LUMBAR TWO POSTERIOR SPINAL INSTRUMENTED FUSION (N/A Back)     Patient location during evaluation: PACU Anesthesia Type: General Level of consciousness: awake Pain management: pain level controlled Vital Signs Assessment: post-procedure vital signs reviewed and stable Respiratory status: spontaneous breathing Cardiovascular status: stable Postop Assessment: no apparent nausea or vomiting Anesthetic complications: no    Last Vitals:  Vitals:   01/08/20 2315 01/08/20 2335  BP: 122/78 132/88  Pulse: 95 98  Resp: 15 20  Temp: 37.1 C 37.2 C  SpO2: 94% 93%    Last Pain:  Vitals:   01/08/20 2335  TempSrc: Oral  PainSc: Asleep                 Chimamanda Siegfried

## 2020-01-09 NOTE — Progress Notes (Signed)
Patient with 180 ml bloody drainage in JP drain this shift, call to Dr. Saintclair Halsted, neurosurgery. Dr. Saintclair Halsted states to cancel order to discontinue drain, he will reassess tomorrow.

## 2020-01-09 NOTE — Progress Notes (Signed)
Neurology Progress Note   S:// No new seizure activity overnight. Underwent thoracic 9 to lumbar 2 posterior spinal instrumented fusion.   O:// Current vital signs: BP 120/77 (BP Location: Right Arm)   Pulse 82   Temp (!) 97.5 F (36.4 C) (Oral)   Resp 18   Ht 6' (1.829 m)   Wt 102 kg   SpO2 95%   BMI 30.50 kg/m  Vital signs in last 24 hours: Temp:  [97.5 F (36.4 C)-99 F (37.2 C)] 97.5 F (36.4 C) (02/27 0410) Pulse Rate:  [79-112] 82 (02/27 0410) Resp:  [9-24] 18 (02/27 0410) BP: (110-156)/(71-105) 120/77 (02/27 0410) SpO2:  [88 %-100 %] 95 % (02/27 0410) Weight:  [102 kg] 102 kg (02/26 1322) Awake alert in no distress Normocephalic atraumatic Lungs clear Regular rate rhythm Neurological exam Awake alert oriented x3 No dysarthria No aphasia Cranial nerves: Pupils equal round react light, extraocular movements intact, visual fields full, face symmetric, tongue and palate midline. Motor exam: No drift in the upper extremities-5/5 strength.  Lower extremities are antigravity 4/5 without any drift but some limitation by pain. Sensory exam intact No dysmetria.  Medications  Current Facility-Administered Medications:  .  0.9 %  sodium chloride infusion (Manually program via Guardrails IV Fluids), , Intravenous, Once, Claris Che, CRNA .  acetaminophen (TYLENOL) tablet 650 mg, 650 mg, Oral, Q6H PRN **OR** acetaminophen (TYLENOL) suppository 650 mg, 650 mg, Rectal, Q6H PRN, Carollee Leitz, MD .  HYDROmorphone (DILAUDID) injection 1 mg, 1 mg, Intravenous, Q2H PRN, Gladys Damme, MD .  lactated ringers infusion, , Intravenous, Continuous, Belinda Block, MD, Last Rate: 10 mL/hr at 01/08/20 1912, New Bag at 01/08/20 1912 .  MEDLINE mouth rinse, 15 mL, Mouth Rinse, BID, Andrena Mews T, MD .  oxyCODONE (Oxy IR/ROXICODONE) immediate release tablet 5-10 mg, 5-10 mg, Oral, Q4H PRN, Gladys Damme, MD, 10 mg at 01/09/20 0122 Labs CBC    Component Value Date/Time    WBC 9.6 01/09/2020 0253   RBC 4.40 01/09/2020 0253   HGB 13.4 01/09/2020 0253   HGB 14.8 05/13/2019 1428   HCT 39.4 01/09/2020 0253   HCT 42.6 05/13/2019 1428   PLT 155 01/09/2020 0253   PLT 207 05/13/2019 1428   MCV 89.5 01/09/2020 0253   MCV 87 05/13/2019 1428   MCH 30.5 01/09/2020 0253   MCHC 34.0 01/09/2020 0253   RDW 12.7 01/09/2020 0253   RDW 13.1 05/13/2019 1428   LYMPHSABS 2.0 05/13/2019 1428   EOSABS 0.1 05/13/2019 1428   BASOSABS 0.0 05/13/2019 1428    CMP     Component Value Date/Time   NA 137 01/09/2020 0253   NA 140 05/13/2019 1428   K 4.0 01/09/2020 0253   CL 105 01/09/2020 0253   CO2 23 01/09/2020 0253   GLUCOSE 190 (H) 01/09/2020 0253   BUN 11 01/09/2020 0253   BUN 12 05/13/2019 1428   CREATININE 1.07 01/09/2020 0253   CALCIUM 8.1 (L) 01/09/2020 0253   PROT 5.7 (L) 01/09/2020 0253   PROT 6.2 05/13/2019 1428   ALBUMIN 3.5 01/09/2020 0253   ALBUMIN 4.4 05/13/2019 1428   AST 25 01/09/2020 0253   ALT 22 01/09/2020 0253   ALKPHOS 42 01/09/2020 0253   BILITOT 0.8 01/09/2020 0253   BILITOT 0.3 05/13/2019 1428   GFRNONAA >60 01/09/2020 0253   GFRAA >60 01/09/2020 0253   Studies yesterday with no evidence of seizures.  Excessive beta secondary to medications. No new imaging to review  Assessment: 69 year old with  history of seizures, coming in with a breakthrough seizure and had another seizure in the emergency room lasting 1 to 2 minutes. Also had T-spine fracture that needed neurosurgical intervention. Seizures abated after benzodiazepine and Keppra.   Impression: Breakthrough seizure  Recommendations: Increase Keppra to 1000 twice daily Maintain seizure precautions Can consider MRI brain as an outpatient.  Noncontrast head CT was unremarkable.  Examination back to baseline. Postsurgical management of the thoracic fusion per neurosurgery and primary team. Follow-up outpatient neurology in 2 to 4 weeks. Neurology will be available as  needed. -- Amie Portland, MD [facility] Pager: [phone] If 7pm to 7am, please call on call as listed on [facility].

## 2020-01-09 NOTE — Progress Notes (Signed)
Subjective: Patient reports Patient doing well no leg pain no numbness or tingling  Objective: Vital signs in last 24 hours: Temp:  [97.5 F (36.4 C)-99 F (37.2 C)] 98.9 F (37.2 C) (02/27 0815) Pulse Rate:  [79-112] 79 (02/27 0815) Resp:  [9-24] 16 (02/27 0815) BP: (110-156)/(71-105) 118/78 (02/27 0815) SpO2:  [88 %-100 %] 96 % (02/27 0815) Weight:  [102 kg] 102 kg (02/26 1322)  Intake/Output from previous day: 02/26 0701 - 02/27 0700 In: 2378 [I.V.:1878; IV Piggyback:500] Out: [id number] [Urine:755; Drains:44; Blood:600] Intake/Output this shift: No intake/output data recorded.  Strength out of 5 wound clean dry and intact drain output 44  Lab Results: Recent Labs    01/09/20 0253 01/09/20 0823  WBC 9.6 12.6*  HGB 13.4 13.3  HCT 39.4 39.5  PLT 155 162   BMET Recent Labs    01/08/20 1330 01/09/20 0253  NA 139 137  K 4.2 4.0  CL 106 105  CO2 24 23  GLUCOSE 119* 190*  BUN 12 11  CREATININE 1.23 1.07  CALCIUM 8.7* 8.1*    Studies/Results: DG Thoracolumabar Spine  Result Date: 01/08/2020 CLINICAL DATA:  Intraop thoracolumbar spine fusion EXAM: THORACOLUMBAR SPINE 1V COMPARISON:  None. FINDINGS: Two intraop fluoroscopic guided images were obtained of thoracolumbar spine posterior fusion. Transpedicular screws are noted. Fluoro time 55 seconds IMPRESSION: Intraop views from thoracolumbar spine fusion. Electronically Signed   By: Prudencio Pair M.D.   On: 01/08/2020 22:31   CT HEAD WO CONTRAST  Result Date: 01/08/2020 CLINICAL DATA:  Seizure EXAM: CT HEAD WITHOUT CONTRAST TECHNIQUE: Contiguous axial images were obtained from the base of the skull through the vertex without intravenous contrast. COMPARISON:  2011 CT FINDINGS: Brain: There is no acute intracranial hemorrhage, mass-effect, or edema. Gray-white differentiation is preserved. There is no extra-axial fluid collection. Ventricles and sulci are within normal limits in size and configuration. Patchy hypoattenuation  in the supratentorial white matter is nonspecific may reflect mild chronic microvascular ischemic changes. Vascular: No hyperdense vessel or unexpected calcification. Skull: Calvarium is unremarkable. Sinuses/Orbits: Minor mucosal thickening.  Orbits are unremarkable. Other: None. IMPRESSION: No acute intracranial hemorrhage, mass effect, or evidence of acute infarction. Electronically Signed   By: Macy Mis M.D.   On: 01/08/2020 14:27   CT Thoracic Spine Wo Contrast  Result Date: 01/08/2020 CLINICAL DATA:  Back pain. The patient experienced a seizure while on a ladder at home and fell. EXAM: CT THORACIC AND LUMBAR SPINE WITHOUT CONTRAST TECHNIQUE: Multidetector CT imaging of the thoracic and lumbar spine was performed without contrast. Multiplanar CT image reconstructions were also generated. COMPARISON:  Lumbar radiographs dated 03/30/2019 and CT scan of the thoracic spine dated 10/06/2013 and CT scan of the chest dated 12/08/2018 FINDINGS: CT THORACIC SPINE FINDINGS Alignment: Normal. Vertebrae: There is an acute coronal oblique fracture through the T11 vertebral body. The fracture does not involve the pedicles or lamina or spinous process. The fracture does extend through the T11-12 disc space with widening of the disc space. There are old wedge deformities of T6 and of the superior endplate of 624THL and L1. Chronic fusion of the T12-L1 level by osteophytes. Paraspinal and other soft tissues: Slight soft tissue stranding at T11 secondary to hemorrhage from the fracture. No hematoma. Disc levels: C7-T1: Osteophytes fuse the vertebral bodies. No disc bulging or protrusion. T1-2 through T10-11: No significant abnormality. T11-12: Fracture through the disc space with abnormal widening of the anterior aspect of the disc space with a coronal oblique fracture through  the T11 vertebral body. No displacement of bone fragments. No evidence of hematoma in the spinal canal. Slight paraspinal soft tissue stranding  around the T11 vertebral body consistent with slight hemorrhage. T12-L1: Chronic fusion of the vertebra by bridging osteophytes. Multiple note is made of multiple old healed right posterolateral rib fractures at the costovertebral joints. These are not acute. CT LUMBAR SPINE FINDINGS Segmentation: 5 lumbar type vertebrae. Alignment: Normal. Vertebrae: Old healed compression fractures of L1 and L5. The L5 fracture has been treated with vertebroplasty. Solid posterior fusion at L4-5. Chronic ankylosis of the T12 and L1 vertebra. Acute fracture or of T11 extending through the T11-12 disc space as described above. Acute fractures of the left transverse processes of L1, L2, and L3. Paraspinal and other soft tissues: No acute abnormality of the paraspinal soft tissues in the lumbar spine. Disc levels: T12-L1: No disc bulging or protrusion. Old healed fracture of the superior endplate of L1. Osteophytes fuse the T11 and T12 vertebra. L1-2: No significant abnormality. L2-3: Tiny broad-based disc bulge with no neural impingement. Moderate bilateral facet arthritis. Posterior decompression. Resection of the spinous process. L3-4: Normal disc. Previous resection of the spinous process. Moderate arthritis of the facet joints. L4-5: Small broad-based bulge of the disc. Old healed compression fracture of the superior aspect of L5. Fusion of the facet joints. Resection of the spinous processes. L5-S1: Tiny central disc bulge without impingement. Severe bilateral facet arthritis. Slight narrowing of the spinal canal. Left foraminal impingement which could affect the left L5 nerve, best seen on image 49 of series 10. IMPRESSION: CT THORACIC SPINE 1. Acute coronal oblique fracture through the T11 vertebral body with widening of the anterior aspect of the disc space at T11-12 without neural impingement. 2. No other acute abnormality of the thoracic spine. Osteophytes fuse the thoracic spine from T5 to T11. CT LUMBAR SPINE 1. Acute  fractures of the left transverse processes of L1, L2, and L3. Old healed compression fractures of L1 and L5. 2. Left foraminal stenosis at L5-S1 which could affect the left L5 nerve. Electronically Signed   By: Lorriane Shire M.D.   On: 01/08/2020 14:46   CT Lumbar Spine Wo Contrast  Result Date: 01/08/2020 CLINICAL DATA:  Back pain. The patient experienced a seizure while on a ladder at home and fell. EXAM: CT THORACIC AND LUMBAR SPINE WITHOUT CONTRAST TECHNIQUE: Multidetector CT imaging of the thoracic and lumbar spine was performed without contrast. Multiplanar CT image reconstructions were also generated. COMPARISON:  Lumbar radiographs dated 03/30/2019 and CT scan of the thoracic spine dated 10/06/2013 and CT scan of the chest dated 12/08/2018 FINDINGS: CT THORACIC SPINE FINDINGS Alignment: Normal. Vertebrae: There is an acute coronal oblique fracture through the T11 vertebral body. The fracture does not involve the pedicles or lamina or spinous process. The fracture does extend through the T11-12 disc space with widening of the disc space. There are old wedge deformities of T6 and of the superior endplate of 624THL and L1. Chronic fusion of the T12-L1 level by osteophytes. Paraspinal and other soft tissues: Slight soft tissue stranding at T11 secondary to hemorrhage from the fracture. No hematoma. Disc levels: C7-T1: Osteophytes fuse the vertebral bodies. No disc bulging or protrusion. T1-2 through T10-11: No significant abnormality. T11-12: Fracture through the disc space with abnormal widening of the anterior aspect of the disc space with a coronal oblique fracture through the T11 vertebral body. No displacement of bone fragments. No evidence of hematoma in the spinal canal. Slight  paraspinal soft tissue stranding around the T11 vertebral body consistent with slight hemorrhage. T12-L1: Chronic fusion of the vertebra by bridging osteophytes. Multiple note is made of multiple old healed right posterolateral  rib fractures at the costovertebral joints. These are not acute. CT LUMBAR SPINE FINDINGS Segmentation: 5 lumbar type vertebrae. Alignment: Normal. Vertebrae: Old healed compression fractures of L1 and L5. The L5 fracture has been treated with vertebroplasty. Solid posterior fusion at L4-5. Chronic ankylosis of the T12 and L1 vertebra. Acute fracture or of T11 extending through the T11-12 disc space as described above. Acute fractures of the left transverse processes of L1, L2, and L3. Paraspinal and other soft tissues: No acute abnormality of the paraspinal soft tissues in the lumbar spine. Disc levels: T12-L1: No disc bulging or protrusion. Old healed fracture of the superior endplate of L1. Osteophytes fuse the T11 and T12 vertebra. L1-2: No significant abnormality. L2-3: Tiny broad-based disc bulge with no neural impingement. Moderate bilateral facet arthritis. Posterior decompression. Resection of the spinous process. L3-4: Normal disc. Previous resection of the spinous process. Moderate arthritis of the facet joints. L4-5: Small broad-based bulge of the disc. Old healed compression fracture of the superior aspect of L5. Fusion of the facet joints. Resection of the spinous processes. L5-S1: Tiny central disc bulge without impingement. Severe bilateral facet arthritis. Slight narrowing of the spinal canal. Left foraminal impingement which could affect the left L5 nerve, best seen on image 49 of series 10. IMPRESSION: CT THORACIC SPINE 1. Acute coronal oblique fracture through the T11 vertebral body with widening of the anterior aspect of the disc space at T11-12 without neural impingement. 2. No other acute abnormality of the thoracic spine. Osteophytes fuse the thoracic spine from T5 to T11. CT LUMBAR SPINE 1. Acute fractures of the left transverse processes of L1, L2, and L3. Old healed compression fractures of L1 and L5. 2. Left foraminal stenosis at L5-S1 which could affect the left L5 nerve. Electronically  Signed   By: Lorriane Shire M.D.   On: 01/08/2020 14:46   DG C-Arm 1-60 Min  Result Date: 01/08/2020 CLINICAL DATA:  Intraop thoracolumbar spine fusion EXAM: THORACOLUMBAR SPINE 1V COMPARISON:  None. FINDINGS: Two intraop fluoroscopic guided images were obtained of thoracolumbar spine posterior fusion. Transpedicular screws are noted. Fluoro time 55 seconds IMPRESSION: Intraop views from thoracolumbar spine fusion. Electronically Signed   By: Prudencio Pair M.D.   On: 01/08/2020 22:31    Assessment/Plan: Mobilize with physical outpatient therapy get him a TLSO and DC drain  LOS: 1 day     [place] Oborn P 01/09/2020, 9:17 AM

## 2020-01-09 NOTE — Progress Notes (Signed)
Orthopedic Tech Progress Note Patient Details:  Benjamin [name] 1951-10-12 [id number] Went to apply TLSO brace to patient. Letting him know brace is meant to be worn when up walking. Patient was confused because patient MD told him he did not need brace but when I came in with brace he said he'll wait for therapy. Patient ID: Benjamin [name], male   DOB: 1951/02/05, 69 y.o.   MRN: [id number]   Benjamin [name] 01/09/2020, 11:42 AM

## 2020-01-10 DIAGNOSIS — S22088K Other fracture of T11-T12 vertebra, subsequent encounter for fracture with nonunion: Secondary | ICD-10-CM

## 2020-01-10 LAB — BASIC METABOLIC PANEL
Anion gap: 7 (ref 5–15)
BUN: 13 mg/dL (ref 8–23)
CO2: 26 mmol/L (ref 22–32)
Calcium: 8 mg/dL — ABNORMAL LOW (ref 8.9–10.3)
Chloride: 103 mmol/L (ref 98–111)
Creatinine, Ser: 1.03 mg/dL (ref 0.61–1.24)
GFR calc Af Amer: >60 mL/min (ref >60)
GFR calc non Af Amer: >60 mL/min (ref >60)
Glucose, Bld: 172 mg/dL — ABNORMAL HIGH (ref 70–99)
Potassium: 3.9 mmol/L (ref 3.5–5.1)
Sodium: 136 mmol/L (ref 135–145)

## 2020-01-10 MED ORDER — HEPARIN SODIUM (PORCINE) 5000 UNIT/ML IJ SOLN
5000.0000 [IU] | Freq: Three times a day (TID) | INTRAMUSCULAR | Status: DC
  Administered 2020-01-10 – 2020-01-11 (×2): 5000 [IU] via SUBCUTANEOUS
  Filled 2020-01-10 (×2): qty 1

## 2020-01-10 NOTE — Discharge Summary (Addendum)
[facility] Discharge Summary  Patient name: Benjamin [name] Medical record number: [id number] Date of birth: 02-Apr-1951 Age: 69 y.o. Gender: male Date of Admission: 01/08/2020  Date of Discharge: 01/11/2020 Admitting Physician: Carollee Leitz, MD  Primary Care Provider: Cyndi Bender, PA-C Consultants: Neurology, Neurosurgery  Indication for Hospitalization: seizure and displaced vertebral fractures  Discharge Diagnoses/Problem List:  Seizure disorder Multi-level fusion :T11 fracture, L1-L3 fractures  Disposition: CIR  Discharge Condition:  stable and improved  Discharge Exam:  Body mass index is 30.5 kg/m.  Vitals:   01/11/20 0359 01/11/20 0736 01/11/20 1159 01/11/20 1657  BP: 117/71 111/75 129/79 127/84  Pulse: 81 89 90 90  Resp: 18 18 18 18   Temp: 98.2 F (36.8 C) 99.3 F (37.4 C) 97.7 F (36.5 C) 97.8 F (36.6 C)  TempSrc: Oral Oral Oral Oral  SpO2: 97% 90% 95% 100%  Weight:      Height:       General: 69 year old male in no acute distress Cardiovascular: Regular rate and rhythm, no murmurs or gallops appreciated, distal pulses present Respiratory: Chest clear to auscultation bilaterally, no wheezes or crackles noted, good cap refill Abdomen: Soft, nontender, nondistended, bowel sounds present Extremities: Moves all extremities, no lower extremity edema Neuro: Alert and oriented x4, CN III-XII intact, motor and sensation normal  Brief Hospital Course:  Patient was on a ladder changing batteries in his smoke alarm when he had a seizure. He fell from the ladder and sustained several fractures from T11-L3. Neurology was consulted and patient was given a loading dose of IV Keppra 2000mg  and IV Ativan 2mg .  CT head negative for acute changes.  EEG showed within normal limits with excessive beta activity thought to be due to benzodiazepine but otherwise a benign EEG pattern.  No seizures or eliptiform discharges were seen.  He was started on Keppra  750 mg BID and was increased to 1000mg  BID.  CT Lumbar spine was impressive for acute fractures of the left transverse process of L1, L2, and L3. Old healed compression fractures of L1 and L5 and left foraminal stenosis at L5-S1.  CT Thoracic spine shows acute coronal oblique fracture through the T11 vertebral body with widening of the anterior aspect of the disc space at T11-12 without neural impingement.  Neurosurgery was consulted and patient was taken to the OR 02/26 for T9-L2 posterior spinal fusion. JP drain was placed and removed on 2/28, post-op day 2. Post operative course was unremarkable, patient's neurologic exam benign and WNL. OT recommended CIR and PT recommended SNF, Home health.  CIR was consulted and patient transferred there on 01/11/2020.  Issues for Follow Up:  1. Follow up Neurology 2-4 weeks as an outpatient, requests MRI brain in outpatient setting 2. Follow up Neurosurgery: patient can wear brace for comfort, does not have to wear at all times 3. Patient on fosamax at home. Recommend follow up for Dexa scan if indicated.  Significant Procedures: T9-L3 spinal fusion 2/26  Significant Labs and Imaging:  Recent Labs  Lab 01/08/20 1330 01/09/20 0253 01/09/20 0823  WBC 6.4 9.6 12.6*  HGB 15.9 13.4 13.3  HCT 46.9 39.4 39.5  PLT 180 155 162   Recent Labs  Lab 01/08/20 1330 01/08/20 1330 01/09/20 0253 01/10/20 0509  NA 139  --  137 136  K 4.2   < > 4.0 3.9  CL 106  --  105 103  CO2 24  --  23 26  GLUCOSE 119*  --  190* 172*  BUN 12  --  11 13  CREATININE 1.23  --  1.07 1.03  CALCIUM 8.7*  --  8.1* 8.0*  ALKPHOS  --   --  42  --   AST  --   --  25  --   ALT  --   --  22  --   ALBUMIN  --   --  3.5  --    < > = values in this interval not displayed.    DG Thoracolumabar Spine  Result Date: 01/08/2020 CLINICAL DATA:  Intraop thoracolumbar spine fusion EXAM: THORACOLUMBAR SPINE 1V COMPARISON:  None. FINDINGS: Two intraop fluoroscopic guided images were obtained  of thoracolumbar spine posterior fusion. Transpedicular screws are noted. Fluoro time 55 seconds IMPRESSION: Intraop views from thoracolumbar spine fusion. Electronically Signed   By: Prudencio Pair M.D.   On: 01/08/2020 22:31   CT HEAD WO CONTRAST  Result Date: 01/08/2020 CLINICAL DATA:  Seizure EXAM: CT HEAD WITHOUT CONTRAST TECHNIQUE: Contiguous axial images were obtained from the base of the skull through the vertex without intravenous contrast. COMPARISON:  2011 CT FINDINGS: Brain: There is no acute intracranial hemorrhage, mass-effect, or edema. Gray-white differentiation is preserved. There is no extra-axial fluid collection. Ventricles and sulci are within normal limits in size and configuration. Patchy hypoattenuation in the supratentorial white matter is nonspecific may reflect mild chronic microvascular ischemic changes. Vascular: No hyperdense vessel or unexpected calcification. Skull: Calvarium is unremarkable. Sinuses/Orbits: Minor mucosal thickening.  Orbits are unremarkable. Other: None. IMPRESSION: No acute intracranial hemorrhage, mass effect, or evidence of acute infarction. Electronically Signed   By: Macy Mis M.D.   On: 01/08/2020 14:27   CT Thoracic Spine Wo Contrast  Result Date: 01/08/2020 CLINICAL DATA:  Back pain. The patient experienced a seizure while on a ladder at home and fell. EXAM: CT THORACIC AND LUMBAR SPINE WITHOUT CONTRAST TECHNIQUE: Multidetector CT imaging of the thoracic and lumbar spine was performed without contrast. Multiplanar CT image reconstructions were also generated. COMPARISON:  Lumbar radiographs dated 03/30/2019 and CT scan of the thoracic spine dated 10/06/2013 and CT scan of the chest dated 12/08/2018 FINDINGS: CT THORACIC SPINE FINDINGS Alignment: Normal. Vertebrae: There is an acute coronal oblique fracture through the T11 vertebral body. The fracture does not involve the pedicles or lamina or spinous process. The fracture does extend through the  T11-12 disc space with widening of the disc space. There are old wedge deformities of T6 and of the superior endplate of 624THL and L1. Chronic fusion of the T12-L1 level by osteophytes. Paraspinal and other soft tissues: Slight soft tissue stranding at T11 secondary to hemorrhage from the fracture. No hematoma. Disc levels: C7-T1: Osteophytes fuse the vertebral bodies. No disc bulging or protrusion. T1-2 through T10-11: No significant abnormality. T11-12: Fracture through the disc space with abnormal widening of the anterior aspect of the disc space with a coronal oblique fracture through the T11 vertebral body. No displacement of bone fragments. No evidence of hematoma in the spinal canal. Slight paraspinal soft tissue stranding around the T11 vertebral body consistent with slight hemorrhage. T12-L1: Chronic fusion of the vertebra by bridging osteophytes. Multiple note is made of multiple old healed right posterolateral rib fractures at the costovertebral joints. These are not acute. CT LUMBAR SPINE FINDINGS Segmentation: 5 lumbar type vertebrae. Alignment: Normal. Vertebrae: Old healed compression fractures of L1 and L5. The L5 fracture has been treated with vertebroplasty. Solid posterior fusion at L4-5. Chronic ankylosis of the T12 and L1 vertebra.  Acute fracture or of T11 extending through the T11-12 disc space as described above. Acute fractures of the left transverse processes of L1, L2, and L3. Paraspinal and other soft tissues: No acute abnormality of the paraspinal soft tissues in the lumbar spine. Disc levels: T12-L1: No disc bulging or protrusion. Old healed fracture of the superior endplate of L1. Osteophytes fuse the T11 and T12 vertebra. L1-2: No significant abnormality. L2-3: Tiny broad-based disc bulge with no neural impingement. Moderate bilateral facet arthritis. Posterior decompression. Resection of the spinous process. L3-4: Normal disc. Previous resection of the spinous process. Moderate  arthritis of the facet joints. L4-5: Small broad-based bulge of the disc. Old healed compression fracture of the superior aspect of L5. Fusion of the facet joints. Resection of the spinous processes. L5-S1: Tiny central disc bulge without impingement. Severe bilateral facet arthritis. Slight narrowing of the spinal canal. Left foraminal impingement which could affect the left L5 nerve, best seen on image 49 of series 10. IMPRESSION: CT THORACIC SPINE 1. Acute coronal oblique fracture through the T11 vertebral body with widening of the anterior aspect of the disc space at T11-12 without neural impingement. 2. No other acute abnormality of the thoracic spine. Osteophytes fuse the thoracic spine from T5 to T11. CT LUMBAR SPINE 1. Acute fractures of the left transverse processes of L1, L2, and L3. Old healed compression fractures of L1 and L5. 2. Left foraminal stenosis at L5-S1 which could affect the left L5 nerve. Electronically Signed   By: Lorriane Shire M.D.   On: 01/08/2020 14:46   CT Lumbar Spine Wo Contrast  Result Date: 01/08/2020 CLINICAL DATA:  Back pain. The patient experienced a seizure while on a ladder at home and fell. EXAM: CT THORACIC AND LUMBAR SPINE WITHOUT CONTRAST TECHNIQUE: Multidetector CT imaging of the thoracic and lumbar spine was performed without contrast. Multiplanar CT image reconstructions were also generated. COMPARISON:  Lumbar radiographs dated 03/30/2019 and CT scan of the thoracic spine dated 10/06/2013 and CT scan of the chest dated 12/08/2018 FINDINGS: CT THORACIC SPINE FINDINGS Alignment: Normal. Vertebrae: There is an acute coronal oblique fracture through the T11 vertebral body. The fracture does not involve the pedicles or lamina or spinous process. The fracture does extend through the T11-12 disc space with widening of the disc space. There are old wedge deformities of T6 and of the superior endplate of 624THL and L1. Chronic fusion of the T12-L1 level by osteophytes.  Paraspinal and other soft tissues: Slight soft tissue stranding at T11 secondary to hemorrhage from the fracture. No hematoma. Disc levels: C7-T1: Osteophytes fuse the vertebral bodies. No disc bulging or protrusion. T1-2 through T10-11: No significant abnormality. T11-12: Fracture through the disc space with abnormal widening of the anterior aspect of the disc space with a coronal oblique fracture through the T11 vertebral body. No displacement of bone fragments. No evidence of hematoma in the spinal canal. Slight paraspinal soft tissue stranding around the T11 vertebral body consistent with slight hemorrhage. T12-L1: Chronic fusion of the vertebra by bridging osteophytes. Multiple note is made of multiple old healed right posterolateral rib fractures at the costovertebral joints. These are not acute. CT LUMBAR SPINE FINDINGS Segmentation: 5 lumbar type vertebrae. Alignment: Normal. Vertebrae: Old healed compression fractures of L1 and L5. The L5 fracture has been treated with vertebroplasty. Solid posterior fusion at L4-5. Chronic ankylosis of the T12 and L1 vertebra. Acute fracture or of T11 extending through the T11-12 disc space as described above. Acute fractures of the  left transverse processes of L1, L2, and L3. Paraspinal and other soft tissues: No acute abnormality of the paraspinal soft tissues in the lumbar spine. Disc levels: T12-L1: No disc bulging or protrusion. Old healed fracture of the superior endplate of L1. Osteophytes fuse the T11 and T12 vertebra. L1-2: No significant abnormality. L2-3: Tiny broad-based disc bulge with no neural impingement. Moderate bilateral facet arthritis. Posterior decompression. Resection of the spinous process. L3-4: Normal disc. Previous resection of the spinous process. Moderate arthritis of the facet joints. L4-5: Small broad-based bulge of the disc. Old healed compression fracture of the superior aspect of L5. Fusion of the facet joints. Resection of the spinous  processes. L5-S1: Tiny central disc bulge without impingement. Severe bilateral facet arthritis. Slight narrowing of the spinal canal. Left foraminal impingement which could affect the left L5 nerve, best seen on image 49 of series 10. IMPRESSION: CT THORACIC SPINE 1. Acute coronal oblique fracture through the T11 vertebral body with widening of the anterior aspect of the disc space at T11-12 without neural impingement. 2. No other acute abnormality of the thoracic spine. Osteophytes fuse the thoracic spine from T5 to T11. CT LUMBAR SPINE 1. Acute fractures of the left transverse processes of L1, L2, and L3. Old healed compression fractures of L1 and L5. 2. Left foraminal stenosis at L5-S1 which could affect the left L5 nerve. Electronically Signed   By: Lorriane Shire M.D.   On: 01/08/2020 14:46   DG C-Arm 1-60 Min  Result Date: 01/08/2020 CLINICAL DATA:  Intraop thoracolumbar spine fusion EXAM: THORACOLUMBAR SPINE 1V COMPARISON:  None. FINDINGS: Two intraop fluoroscopic guided images were obtained of thoracolumbar spine posterior fusion. Transpedicular screws are noted. Fluoro time 55 seconds IMPRESSION: Intraop views from thoracolumbar spine fusion. Electronically Signed   By: Prudencio Pair M.D.   On: 01/08/2020 22:31   Results/Tests Pending at Time of Discharge: none  Discharge Medications:  Allergies as of 01/11/2020       Reactions   Aspartame And Phenylalanine Other (See Comments)   Powder causes seizures   Benadryl [diphenhydramine] Other (See Comments)   seizures   Zoloft [sertraline Hcl] Other (See Comments)   Confusion Memory loss        Medication List     STOP taking these medications    ALPRAZolam 1 MG tablet Commonly known as: Xanax   cyclobenzaprine 10 MG tablet Commonly known as: FLEXERIL       TAKE these medications    acetaminophen 500 MG tablet Commonly known as: TYLENOL Take 2 tablets (1,000 mg total) by mouth every 6 (six) hours. What changed:  when to  take this reasons to take this   alendronate 70 MG tablet Commonly known as: FOSAMAX Take 70 mg by mouth once a week.   levETIRAcetam 1000 MG tablet Commonly known as: KEPPRA Take 1 tablet (1,000 mg total) by mouth 2 (two) times daily. What changed:  medication strength how much to take when to take this Another medication with the same name was removed. Continue taking this medication, and follow the directions you see here.   oxyCODONE 5 MG immediate release tablet Commonly known as: Oxy IR/ROXICODONE Take 1 tablet (5 mg total) by mouth every 6 (six) hours as needed for moderate pain or severe pain.   oxyCODONE 10 mg 12 hr tablet Commonly known as: OXYCONTIN Take 1 tablet (10 mg total) by mouth every 12 (twelve) hours.        Discharge Instructions: Please refer to Patient Instructions  section of EMR for full details.  Patient was counseled important signs and symptoms that should prompt return to medical care, changes in medications, dietary instructions, activity restrictions, and follow up appointments.   Follow-Up Appointments:  Discharged to CIR.  Gladys Damme, MD 01/11/2020, 5:04 PM PGY-1, [facility] Resident Addendum   I have discussed the above with the original author and agree with their documentation. My edits for correction/addition/clarification have been made.  Please see also any attending notes.   [place] Constable, D.O. PGY-2, [facility] 01/11/2020 9:02 PM

## 2020-01-10 NOTE — Progress Notes (Signed)
Neurosurgery Service Progress Note  Subjective: No acute events overnight, no new complaints, back soreness  Objective: Vitals:   01/09/20 2354 01/10/20 0015 01/10/20 0439 01/10/20 0820  BP: 124/70  124/75 106/68  Pulse: 92  81 92  Resp: 18  18 20   Temp: 99.9 F (37.7 C)  99 F (37.2 C) 99 F (37.2 C)  TempSrc: Oral  Oral Oral  SpO2: (!) 89% 94% 97% 96%  Weight:      Height:       Temp (24hrs), Avg:99 F (37.2 C), Min:98.3 F (36.8 C), Max:99.9 F (37.7 C)  CBC Latest Ref Rng & Units 01/09/2020 01/09/2020 01/08/2020  WBC 4.0 - 10.5 K/uL 12.6(H) 9.6 6.4  Hemoglobin 13.0 - 17.0 g/dL 13.3 13.4 15.9  Hematocrit 39.0 - 52.0 % 39.5 39.4 46.9  Platelets 150 - 400 K/uL 162 155 180   BMP Latest Ref Rng & Units 01/10/2020 01/09/2020 01/08/2020  Glucose 70 - 99 mg/dL 172(H) 190(H) 119(H)  BUN 8 - 23 mg/dL 13 11 12   Creatinine 0.61 - 1.24 mg/dL 1.03 1.07 1.23  BUN/Creat Ratio 10 - 24 - - -  Sodium 135 - 145 mmol/L 136 137 139  Potassium 3.5 - 5.1 mmol/L 3.9 4.0 4.2  Chloride 98 - 111 mmol/L 103 105 106  CO2 22 - 32 mmol/L 26 23 24   Calcium 8.9 - 10.3 mg/dL 8.0(L) 8.1(L) 8.7(L)    Intake/Output Summary (Last 24 hours) at 01/10/2020 1002 Last data filed at 01/10/2020 0820 Gross per 24 hour  Intake 335 ml  Output 1230 ml  Net -895 ml    Current Facility-Administered Medications:  .  0.9 %  sodium chloride infusion (Manually program via Guardrails IV Fluids), , Intravenous, Once, Claris Che, CRNA .  acetaminophen (TYLENOL) tablet 650 mg, 650 mg, Oral, Q6H PRN, 650 mg at 01/10/20 0955 **OR** acetaminophen (TYLENOL) suppository 650 mg, 650 mg, Rectal, Q6H PRN, Carollee Leitz, MD .  HYDROmorphone (DILAUDID) injection 1 mg, 1 mg, Intravenous, Q2H PRN, Gladys Damme, MD .  lactated ringers infusion, , Intravenous, Continuous, Belinda Block, MD, Stopped at 01/09/20 1553 .  levETIRAcetam (KEPPRA) tablet 1,000 mg, 1,000 mg, Oral, BID, Amie Portland, MD, 1,000 mg at 01/10/20 0955 .   MEDLINE mouth rinse, 15 mL, Mouth Rinse, BID, Gwendlyn Deutscher, Kehinde T, MD, 15 mL at 01/10/20 0956 .  oxyCODONE (Oxy IR/ROXICODONE) immediate release tablet 5-10 mg, 5-10 mg, Oral, Q4H PRN, Gladys Damme, MD, 10 mg at 01/10/20 [id number]   Physical Exam: AOx3, PERRL, EOMI, FS, Strength 5/5 x4, SILTx4 except chronic b/l foot numbness Incision c/d/i Drain w/ serosang output  Assessment & Plan: 69 y.o. man s/p fall 2/2 seizure w/ flexion-distraction 3 column injury 2/2 DISH, recovering well.  -d/c drain, d/c foley -okay for prophylactic dose SQH  Jensen Kilburg A Jayd Cadieux  01/10/20 10:02 AM

## 2020-01-10 NOTE — Progress Notes (Signed)
Family Medicine Teaching Service Daily Progress Note Intern Pager: [phone]  Patient name: Benjamin [name] Medical record number: [id number] Date of birth: Oct 14, 1951 Age: 69 y.o. Gender: male  Primary Care Provider: Cyndi Bender, PA-C Consultants: Neurology, Neurosurgery Code Status: Full  Pt Overview and Major Events to Date:  Benjamin [name] is a 69 y.o. male presenting with seizure. PMH is significant for seizures, anxiety, depression. Back pain, peripheral neuropathy. Presented to the ED after a fall from a ladder due to breakthrough seizure. Found to have multiple unstable vertebral body fractures from T9-L2 and had multilevel fixation.  Assessment and Plan:  Seizure Activity No seizure activity since admission.  Keppra was increased yesterday. Neuro exam benign.  No focal deficits -Continue Keppra 1 g twice daily, follow-up in 2-4 weeks with neurology, will need brain MRI with/without contrast but can be done with outpatient -Continue neuro signs every 4 -Continue vital signs per unit -Continue seizure cautions -SCDs for now   T11 fracture secondary to fall from seizure POD#2 T9-L2 posterior spinal instrumentation. Drain in place, 45 mLs. Pain controlled with current medication. -Neurosurgery following -Advance diet as tolerated -Continue Oxycodone 5-10 mg q4h for moderate pain -Dilaudid 1 mg q2h for severe pain  FEN/GI:  -Normal diet PPx:  -SCDs  Disposition: Med-Surg  Subjective:  No acute events overnight.  Patient currently having bed bath.  Reports pain controlled with current medication.  Denies any seizure activity, chest pain or shortness of breath.  Has not been out of bed yet with physio.  Objective: Temp:  [98.3 F (36.8 C)-99.9 F (37.7 C)] 99 F (37.2 C) (02/28 0439) Pulse Rate:  [78-96] 81 (02/28 0439) Resp:  [16-20] 18 (02/28 0439) BP: (103-124)/(64-78) 124/75 (02/28 0439) SpO2:  [89 %-99 %] 97 % (02/28 0439) Physical Exam:  General: 69 year old  male in no acute distress Cardiovascular: Regular rate and rhythm, no murmurs or gallops appreciated, distal pulses present Respiratory: Chest clear to auscultation bilaterally, no wheezes or crackles noted, good cap refill Abdomen: Soft, nontender, nondistended, bowel sounds present Extremities: Moves all extremities, no lower extremity edema Neuro: Alert and oriented x4, CN III-XII intact, motor and sensation normal  Laboratory: Recent Labs  Lab 01/08/20 1330 01/09/20 0253 01/09/20 0823  WBC 6.4 9.6 12.6*  HGB 15.9 13.4 13.3  HCT 46.9 39.4 39.5  PLT 180 155 162   Recent Labs  Lab 01/08/20 1330 01/09/20 0253 01/10/20 0509  NA 139 137 136  K 4.2 4.0 3.9  CL 106 105 103  CO2 24 23 26   BUN 12 11 13   CREATININE 1.23 1.07 1.03  CALCIUM 8.7* 8.1* 8.0*  PROT  --  5.7*  --   BILITOT  --  0.8  --   ALKPHOS  --  42  --   ALT  --  22  --   AST  --  25  --   GLUCOSE 119* 190* 172*   CK: 288 COVID: negative HIV: negative  Imaging/Diagnostic Tests: CT Head non-con:  IMPRESSION: No acute intracranial hemorrhage, mass effect, or evidence of acute Infarction. CT Thoracic and Lumbar Spine: 1. Acute coronal oblique fracture through the T11 vertebral body with widening of the anterior aspect of the disc space at T11-12 without neural impingement. 2. No other acute abnormality of the thoracic spine. Osteophytes fuse the thoracic spine from T5 to T11. 1. Acute fractures of the left transverse processes of L1, L2, and L3. Old healed compression fractures of L1 and L5. 2. Left foraminal stenosis at L5-S1  which could affect the left L5 nerve.  Carollee Leitz, MD 01/10/2020, 7:36 AM PGY-1, [place] Intern pager: [phone], text pages welcome

## 2020-01-10 NOTE — Progress Notes (Signed)
Physical Therapy Treatment Patient Details Name: Benjamin [name] MRN: [id number] DOB: 18-Jan-1951 Today's Date: 01/10/2020    History of Present Illness Patient is a 69 year old male who was up on stepladder changing out batteries in smoke detector fell due to seizure/fell and had seizure. CT: prior L5 and L1 frx, new coronally-oriented fracture through the body of T12 that extends into the T11-12 disc space with fish-mouthing of the disc space, fractures of the L1-L3 TPs on the left.Pt now s/pT9-L2 posterior spinal instrumented fusion. PMH Seizueres, depression, prostate cancer, BPH,     PT Comments    Pt progressing well towards his physical therapy goals. Requiring moderate assist for bed mobility, min assist for transfers and limited ambulation. Ambulating 25 feet with a walker. Reporting dizziness upon return and noted pallor; BP 115/81. Rest of session focused on seated exercises. Pt demonstrates good potential and suspect steady progress given age, PLOF, and motivation. Recommending CIR to address deficits and maximize functional independence.     Follow Up Recommendations  CIR     Equipment Recommendations  Rolling walker with 5" wheels;3in1 (PT)    Recommendations for Other Services Rehab consult     Precautions / Restrictions Precautions Precautions: Back Required Braces or Orthoses: Spinal Brace Spinal Brace: Thoracolumbosacral orthotic("only when ambulating") Restrictions Weight Bearing Restrictions: No    Mobility  Bed Mobility Overal bed mobility: Needs Assistance Bed Mobility: Rolling;Sidelying to Sit Rolling: Min guard Sidelying to sit: Mod assist       General bed mobility comments: Cues for log roll technique, pt rolling towards right, modA for trunk elevation to upright  Transfers Overall transfer level: Needs assistance Equipment used: Rolling walker (2 wheeled) Transfers: Sit to/from Stand Sit to Stand: Min assist         General transfer comment:  MinA to rise from elevated bed height  Ambulation/Gait Ambulation/Gait assistance: Min assist Gait Distance (Feet): 25 Feet Assistive device: Rolling walker (2 wheeled) Gait Pattern/deviations: Step-through pattern;Decreased dorsiflexion - left;Decreased stride length;Decreased step length - left;Trunk flexed Gait velocity: decreased Gait velocity interpretation: <1.31 ft/sec, indicative of household ambulator General Gait Details: MinA for stability, pt tending to drag LLE especially with fatigue, cues for upright posture and walker proximity   Stairs             Wheelchair Mobility    Modified Rankin (Stroke Patients Only)       Balance Overall balance assessment: Needs assistance Sitting-balance support: No upper extremity supported;Feet supported Sitting balance-Leahy Scale: Fair     Standing balance support: Bilateral upper extremity supported Standing balance-Leahy Scale: Poor                              Cognition Arousal/Alertness: Awake/alert Behavior During Therapy: WFL for tasks assessed/performed Overall Cognitive Status: Within Functional Limits for tasks assessed                                 General Comments: WFL for basic mobility tasks, is tangential at times i.e. brings up his wife not wanting to get a colonoscopy when it was unrelated to topic/task at hand      Exercises General Exercises - Lower Extremity Long Arc Quad: Both;20 reps;Seated Hip Flexion/Marching: Both;10 reps;Seated Heel Raises: Both;15 reps;Seated    General Comments        Pertinent Vitals/Pain Pain Assessment: Faces Faces Pain Scale: Hurts whole  lot Pain Location: back Pain Descriptors / Indicators: Aching;Guarding;Grimacing Pain Intervention(s): Limited activity within patient's tolerance;Premedicated before session;Monitored during session;Repositioned    Home Living                      Prior Function            PT Goals  (current goals can now be found in the care plan section) Acute Rehab PT Goals Patient Stated Goal: lessen pain Potential to Achieve Goals: Good Progress towards PT goals: Progressing toward goals    Frequency    Min 5X/week      PT Plan Discharge plan needs to be updated    Co-evaluation              AM-PAC PT "6 Clicks" Mobility   Outcome Measure  Help needed turning from your back to your side while in a flat bed without using bedrails?: A Little Help needed moving from lying on your back to sitting on the side of a flat bed without using bedrails?: A Lot Help needed moving to and from a bed to a chair (including a wheelchair)?: A Little Help needed standing up from a chair using your arms (e.g., wheelchair or bedside chair)?: A Little Help needed to walk in hospital room?: A Little Help needed climbing 3-5 steps with a railing? : A Lot 6 Click Score: 16    End of Session Equipment Utilized During Treatment: Gait belt;Back brace Activity Tolerance: Patient tolerated treatment well Patient left: in chair;with call bell/phone within reach;with chair alarm set Nurse Communication: Mobility status PT Visit Diagnosis: Unsteadiness on feet (R26.81)     Time: [id number] PT Time Calculation (min) (ACUTE ONLY): 44 min  Charges:  $Gait Training: 8-22 mins $Therapeutic Exercise: 8-22 mins $Therapeutic Activity: 8-22 mins                       Wyona Almas, PT, DPT Acute Rehabilitation Services Pager [phone] Office [phone]    Deno Etienne 01/10/2020, 4:41 PM

## 2020-01-10 NOTE — Progress Notes (Signed)
Patient ambulated in room with FWW and TSLO brace in place multiple times today for restroom needs. JP drain removed this evening once brace was removed. No complications noted with removal.

## 2020-01-10 NOTE — Progress Notes (Signed)
Patient transferred to bed side recliner with physical therapy. Patient tolerated activity well with mild dizziness with activity and complains of mild acute pain. Once patient was positioned in a position of stated comfort, pain decreased. Incentive spirometer with instruction provided to patient with return demonstration of proper use.

## 2020-01-11 ENCOUNTER — Inpatient Hospital Stay (HOSPITAL_COMMUNITY): Payer: Medicare Other

## 2020-01-11 ENCOUNTER — Inpatient Hospital Stay (HOSPITAL_COMMUNITY)
Admission: RE | Admit: 2020-01-11 | Discharge: 2020-01-22 | DRG: 561 | Disposition: A | Discharge status: Home Health | Payer: Medicare Other | Source: Intra-hospital | Attending: Physical Medicine and Rehabilitation | Admitting: Physical Medicine and Rehabilitation

## 2020-01-11 DIAGNOSIS — N400 Enlarged prostate without lower urinary tract symptoms: Secondary | ICD-10-CM | POA: Diagnosis present

## 2020-01-11 DIAGNOSIS — Z7983 Long term (current) use of bisphosphonates: Secondary | ICD-10-CM

## 2020-01-11 DIAGNOSIS — Z888 Allergy status to other drugs, medicaments and biological substances status: Secondary | ICD-10-CM

## 2020-01-11 DIAGNOSIS — S22088K Other fracture of T11-T12 vertebra, subsequent encounter for fracture with nonunion: Secondary | ICD-10-CM | POA: Diagnosis not present

## 2020-01-11 DIAGNOSIS — T40605A Adverse effect of unspecified narcotics, initial encounter: Secondary | ICD-10-CM | POA: Diagnosis not present

## 2020-01-11 DIAGNOSIS — R569 Unspecified convulsions: Secondary | ICD-10-CM | POA: Diagnosis not present

## 2020-01-11 DIAGNOSIS — Z8546 Personal history of malignant neoplasm of prostate: Secondary | ICD-10-CM

## 2020-01-11 DIAGNOSIS — S22009A Unspecified fracture of unspecified thoracic vertebra, initial encounter for closed fracture: Secondary | ICD-10-CM | POA: Diagnosis present

## 2020-01-11 DIAGNOSIS — S22089D Unspecified fracture of T11-T12 vertebra, subsequent encounter for fracture with routine healing: Principal | ICD-10-CM

## 2020-01-11 DIAGNOSIS — G6289 Other specified polyneuropathies: Secondary | ICD-10-CM | POA: Diagnosis not present

## 2020-01-11 DIAGNOSIS — K5900 Constipation, unspecified: Secondary | ICD-10-CM | POA: Diagnosis not present

## 2020-01-11 DIAGNOSIS — G4700 Insomnia, unspecified: Secondary | ICD-10-CM | POA: Diagnosis present

## 2020-01-11 DIAGNOSIS — G609 Hereditary and idiopathic neuropathy, unspecified: Secondary | ICD-10-CM | POA: Diagnosis present

## 2020-01-11 DIAGNOSIS — Z79899 Other long term (current) drug therapy: Secondary | ICD-10-CM | POA: Diagnosis not present

## 2020-01-11 DIAGNOSIS — L409 Psoriasis, unspecified: Secondary | ICD-10-CM | POA: Diagnosis present

## 2020-01-11 DIAGNOSIS — W11XXXD Fall on and from ladder, subsequent encounter: Secondary | ICD-10-CM | POA: Diagnosis present

## 2020-01-11 DIAGNOSIS — E1165 Type 2 diabetes mellitus with hyperglycemia: Secondary | ICD-10-CM | POA: Diagnosis present

## 2020-01-11 DIAGNOSIS — E119 Type 2 diabetes mellitus without complications: Secondary | ICD-10-CM

## 2020-01-11 DIAGNOSIS — S22089A Unspecified fracture of T11-T12 vertebra, initial encounter for closed fracture: Secondary | ICD-10-CM | POA: Diagnosis present

## 2020-01-11 DIAGNOSIS — R140 Abdominal distension (gaseous): Secondary | ICD-10-CM | POA: Diagnosis not present

## 2020-01-11 DIAGNOSIS — E7800 Pure hypercholesterolemia, unspecified: Secondary | ICD-10-CM | POA: Diagnosis present

## 2020-01-11 DIAGNOSIS — L4050 Arthropathic psoriasis, unspecified: Secondary | ICD-10-CM | POA: Diagnosis present

## 2020-01-11 DIAGNOSIS — Z91018 Allergy to other foods: Secondary | ICD-10-CM

## 2020-01-11 DIAGNOSIS — G40909 Epilepsy, unspecified, not intractable, without status epilepticus: Secondary | ICD-10-CM | POA: Diagnosis present

## 2020-01-11 DIAGNOSIS — S22080S Wedge compression fracture of T11-T12 vertebra, sequela: Secondary | ICD-10-CM

## 2020-01-11 DIAGNOSIS — Z8042 Family history of malignant neoplasm of prostate: Secondary | ICD-10-CM | POA: Diagnosis not present

## 2020-01-11 DIAGNOSIS — F22 Delusional disorders: Secondary | ICD-10-CM | POA: Diagnosis not present

## 2020-01-11 DIAGNOSIS — S22080D Wedge compression fracture of T11-T12 vertebra, subsequent encounter for fracture with routine healing: Secondary | ICD-10-CM | POA: Diagnosis not present

## 2020-01-11 LAB — GLUCOSE, CAPILLARY: Glucose-Capillary: 122 mg/dL — ABNORMAL HIGH (ref 70–99)

## 2020-01-11 MED ORDER — ACETAMINOPHEN 325 MG PO TABS
325.0000 mg | ORAL_TABLET | ORAL | Status: DC | PRN
  Administered 2020-01-12 – 2020-01-15 (×4): 650 mg via ORAL
  Filled 2020-01-11 (×4): qty 2

## 2020-01-11 MED ORDER — ORAL CARE MOUTH RINSE
15.0000 mL | Freq: Two times a day (BID) | OROMUCOSAL | Status: DC
  Administered 2020-01-11 – 2020-01-22 (×18): 15 mL via OROMUCOSAL

## 2020-01-11 MED ORDER — OXYCODONE HCL 5 MG PO TABS: 5.0000 mg | ORAL_TABLET | Freq: Four times a day (QID) | ORAL | Status: DC | PRN

## 2020-01-11 MED ORDER — LEVETIRACETAM 1000 MG PO TABS: 1000.0000 mg | ORAL_TABLET | Freq: Two times a day (BID) | ORAL | Status: DC

## 2020-01-11 MED ORDER — PROCHLORPERAZINE EDISYLATE 10 MG/2ML IJ SOLN: 5.0000 mg | Freq: Four times a day (QID) | INTRAMUSCULAR | Status: DC | PRN

## 2020-01-11 MED ORDER — ACETAMINOPHEN 500 MG PO TABS: 1000.0000 mg | ORAL_TABLET | Freq: Four times a day (QID) | ORAL | 0 refills | Status: DC

## 2020-01-11 MED ORDER — INSULIN ASPART 100 UNIT/ML ~~LOC~~ SOLN
0.0000 [IU] | Freq: Three times a day (TID) | SUBCUTANEOUS | Status: DC
  Administered 2020-01-12: 2 [IU] via SUBCUTANEOUS
  Administered 2020-01-12 – 2020-01-13 (×2): 1 [IU] via SUBCUTANEOUS
  Administered 2020-01-13: 2 [IU] via SUBCUTANEOUS
  Administered 2020-01-13: 5 [IU] via SUBCUTANEOUS
  Administered 2020-01-14 – 2020-01-19 (×7): 1 [IU] via SUBCUTANEOUS

## 2020-01-11 MED ORDER — SIMETHICONE 80 MG PO CHEW
80.0000 mg | CHEWABLE_TABLET | Freq: Four times a day (QID) | ORAL | Status: DC | PRN
  Administered 2020-01-11 – 2020-01-15 (×3): 80 mg via ORAL
  Filled 2020-01-11 (×3): qty 1

## 2020-01-11 MED ORDER — OXYCODONE HCL 5 MG PO TABS
5.0000 mg | ORAL_TABLET | ORAL | Status: DC | PRN
  Administered 2020-01-12: 10 mg via ORAL
  Filled 2020-01-11 (×2): qty 2

## 2020-01-11 MED ORDER — ACETAMINOPHEN 500 MG PO TABS
1000.0000 mg | ORAL_TABLET | Freq: Four times a day (QID) | ORAL | Status: DC
  Administered 2020-01-11: 1000 mg via ORAL
  Filled 2020-01-11: qty 2

## 2020-01-11 MED ORDER — ALPRAZOLAM 0.5 MG PO TABS
0.5000 mg | ORAL_TABLET | Freq: Every evening | ORAL | Status: DC | PRN
  Administered 2020-01-11 – 2020-01-12 (×2): 0.5 mg via ORAL
  Filled 2020-01-11 (×2): qty 1

## 2020-01-11 MED ORDER — OXYCODONE HCL ER 10 MG PO T12A
10.0000 mg | EXTENDED_RELEASE_TABLET | Freq: Two times a day (BID) | ORAL | Status: DC
  Administered 2020-01-11: 10 mg via ORAL
  Filled 2020-01-11: qty 1

## 2020-01-11 MED ORDER — OXYCODONE HCL ER 10 MG PO T12A
10.0000 mg | EXTENDED_RELEASE_TABLET | Freq: Two times a day (BID) | ORAL | Status: DC
  Administered 2020-01-11 – 2020-01-12 (×2): 10 mg via ORAL
  Filled 2020-01-11 (×2): qty 1

## 2020-01-11 MED ORDER — OXYCODONE HCL 5 MG PO TABS: 5.0000 mg | ORAL_TABLET | Freq: Four times a day (QID) | ORAL | 0 refills | Status: DC | PRN

## 2020-01-11 MED ORDER — POLYETHYLENE GLYCOL 3350 17 G PO PACK
17.0000 g | PACK | Freq: Two times a day (BID) | ORAL | Status: DC
  Administered 2020-01-11 – 2020-01-21 (×6): 17 g via ORAL
  Filled 2020-01-11 (×16): qty 1

## 2020-01-11 MED ORDER — LEVETIRACETAM 500 MG PO TABS
1000.0000 mg | ORAL_TABLET | Freq: Two times a day (BID) | ORAL | Status: DC
  Administered 2020-01-11 – 2020-01-22 (×22): 1000 mg via ORAL
  Filled 2020-01-11 (×23): qty 2

## 2020-01-11 MED ORDER — FLEET ENEMA 7-19 GM/118ML RE ENEM: 1.0000 | ENEMA | Freq: Once | RECTAL | Status: DC | PRN

## 2020-01-11 MED ORDER — PROCHLORPERAZINE MALEATE 5 MG PO TABS: 5.0000 mg | ORAL_TABLET | Freq: Four times a day (QID) | ORAL | Status: DC | PRN

## 2020-01-11 MED ORDER — BISACODYL 10 MG RE SUPP: 10.0000 mg | Freq: Every day | RECTAL | Status: DC | PRN

## 2020-01-11 MED ORDER — DEXTROMETHORPHAN POLISTIREX ER 30 MG/5ML PO SUER
30.0000 mg | Freq: Two times a day (BID) | ORAL | Status: DC | PRN
  Filled 2020-01-11: qty 5

## 2020-01-11 MED ORDER — PROCHLORPERAZINE 25 MG RE SUPP: 12.5000 mg | Freq: Four times a day (QID) | RECTAL | Status: DC | PRN

## 2020-01-11 MED ORDER — METHOCARBAMOL 500 MG PO TABS
500.0000 mg | ORAL_TABLET | Freq: Four times a day (QID) | ORAL | Status: DC | PRN
  Administered 2020-01-11 – 2020-01-19 (×13): 500 mg via ORAL
  Filled 2020-01-11 (×14): qty 1

## 2020-01-11 MED ORDER — INSULIN ASPART 100 UNIT/ML ~~LOC~~ SOLN: 0.0000 [IU] | Freq: Every day | SUBCUTANEOUS | Status: DC

## 2020-01-11 MED ORDER — FLEET ENEMA 7-19 GM/118ML RE ENEM
1.0000 | ENEMA | Freq: Once | RECTAL | Status: AC
  Administered 2020-01-12: 1 via RECTAL
  Filled 2020-01-11: qty 1

## 2020-01-11 MED ORDER — OXYCODONE HCL ER 10 MG PO T12A: 10.0000 mg | EXTENDED_RELEASE_TABLET | Freq: Two times a day (BID) | ORAL | 0 refills | Status: DC

## 2020-01-11 MED ORDER — ENOXAPARIN SODIUM 40 MG/0.4ML ~~LOC~~ SOLN
40.0000 mg | SUBCUTANEOUS | Status: DC
  Administered 2020-01-11: 40 mg via SUBCUTANEOUS
  Filled 2020-01-11: qty 0.4

## 2020-01-11 MED ORDER — ALUM & MAG HYDROXIDE-SIMETH 200-200-20 MG/5ML PO SUSP
30.0000 mL | ORAL | Status: DC | PRN
  Administered 2020-01-14: 30 mL via ORAL
  Filled 2020-01-11: qty 30

## 2020-01-11 MED ORDER — ENOXAPARIN SODIUM 40 MG/0.4ML ~~LOC~~ SOLN
40.0000 mg | SUBCUTANEOUS | Status: DC
  Administered 2020-01-12 – 2020-01-22 (×11): 40 mg via SUBCUTANEOUS
  Filled 2020-01-11 (×11): qty 0.4

## 2020-01-11 NOTE — Consult Note (Signed)
Physical Medicine and Rehabilitation Consult   Reason for Consult: T11 fracture with  Referring Physician: Dr. Gwendlyn Deutscher   HPI: Benjamin [name] is a 69 y.o. male with history of depression, prostate cancer, back pain with neuropathy, seizure disorder who was admitted on 01/08/20 after a fall off the ladder due to seizure. He was loaded with Keppra and work up done revealing oblique coronal T11 vertebral body Fx and acute fractures of L1,L2 and L3 transverse processes as well as L5-S1 left foraminal stenosis. Patient with reports of back pain and unstable closed thoracic fracture. He was taken to OR emergently for T9-L2 posterior fusion by Dr. Zada Finders.  Post op to wear TLSO for comfort prn and Lovenox added for DVT propylaxis. Therapy evaluations done revealing decreased in standing balance and moderate pain with activity. CIR recommended due to functional deficits.   Review of Systems  Constitutional: Negative for chills and fever.  HENT: Negative for hearing loss and tinnitus.   Eyes: Negative for blurred vision and double vision.  Respiratory: Negative for cough and hemoptysis.   Cardiovascular: Negative for chest pain and palpitations.  Gastrointestinal: Positive for abdominal pain.  Genitourinary: Negative for dysuria.  Musculoskeletal: Positive for back pain and myalgias.  Skin: Negative for itching and rash.  Neurological: Positive for weakness. Negative for dizziness and headaches.  Psychiatric/Behavioral: The patient is nervous/anxious.    Past Medical History:  Diagnosis Date  . Acute prostatitis 07/03/12   s/p prostate   . Allergy   . Anxiety   . Arthritis    stenosis lower back  . Back pain   . BPH (benign prostatic hypertrophy) with urinary obstruction   . Depression   . ED (erectile dysfunction)   . Headache(784.0)    "sinus" headaches  . History of ITP    as a child per patient  . Hypercholesterolemia   . Other forms of epilepsy and recurrent seizures without  mention of intractable epilepsy 09/16/2013  . Peripheral axonal neuropathy 08/06/2014  . Prostate cancer ([place]) 07/03/12   Adenocarcinoma,gleason=3+3=6,PSA=5.0,volume=20cc  . Seizures ([place])   . Unspecified hereditary and idiopathic peripheral neuropathy 07/07/2014   Foot numbness starting 2010, hands beginning in 2014- 15 .    Past Surgical History:  Procedure Laterality Date  . BACK SURGERY    . COLONOSCOPY    . KNEE ARTHROSCOPY  2017  . LUMBAR LAMINECTOMY/DECOMPRESSION MICRODISCECTOMY N/A 12/23/2013   Procedure: Lumbar Laminectomy Decompression, Lumbar Two, Three, Four, Five;  Surgeon: Hosie Spangle, MD;  Location: [facility];  Service: Neurosurgery;  Laterality: N/A;  . LYMPHADENECTOMY Bilateral 05/25/2016   Procedure: BILATERAL PELVIC LYMPHADENECTOMY;  Surgeon: Alexis Frock, MD;  Location: [facility];  Service: Urology;  Laterality: Bilateral;  . ROBOT ASSISTED LAPAROSCOPIC RADICAL PROSTATECTOMY N/A 05/25/2016   Procedure: XI ROBOTIC ASSISTED LAPAROSCOPIC RADICAL PROSTATECTOMY WITH INDOCYANINE GREEN DYE;  Surgeon: Alexis Frock, MD;  Location: [facility];  Service: Urology;  Laterality: N/A;  . TOE SURGERY      Family History  Problem Relation Age of Onset  . Cancer Father 53       prostate/prostatectomy  . Cancer Cousin 72       prostate ca, also heart problems    Social History:  Married. Retired from [employer] in 2019. He reports that he has never smoked. He has never used smokeless tobacco. He reports used to drink beer--none recently.  He reports that he does not use drugs.   Allergies  Allergen Reactions  . Aspartame  And Phenylalanine Other (See Comments)    Powder causes seizures  . Benadryl [Diphenhydramine] Other (See Comments)    seizures  . Zoloft [Sertraline Hcl] Other (See Comments)    Confusion Memory loss    Medications Prior to Admission  Medication Sig Dispense Refill  . acetaminophen (TYLENOL) 500 MG tablet Take 1,000 mg by mouth every 6 (six) hours  as needed for mild pain.    Marland Kitchen alendronate (FOSAMAX) 70 MG tablet Take 70 mg by mouth once a week.     . levETIRAcetam (KEPPRA) 750 MG tablet Take 2,250 mg by mouth daily.    Marland Kitchen ALPRAZolam (XANAX) 1 MG tablet 0.5 mg at night if not able to sleep- this is for insomnia. (Patient not taking: Reported on 11/19/2019) 30 tablet 0  . cyclobenzaprine (FLEXERIL) 10 MG tablet Take 1 tablet (10 mg total) by mouth 2 (two) times daily as needed for muscle spasms. (Patient not taking: Reported on 11/19/2019) 20 tablet 0  . levETIRAcetam (KEPPRA XR) 500 MG 24 hr tablet TAKE 3 TABLETS BY MOUTH ONCE A DAY BEST AT BEDTIME (Patient not taking: Reported on 01/08/2020) 270 tablet 1    Home: [place] expects to be discharged to:: Private residence Living Arrangements: Spouse/significant other Available Help at Discharge: Family, Available 24 hours/day Type of Home: House Home Access: Stairs to enter [employer] of Steps: 3 Home Layout: Two level Alternate Level Stairs-Number of Steps: 12 Bathroom Shower/Tub: Tub/shower unit, Architectural technologist: Handicapped height Additional Comments: Patient has steps but his room and bathroom on on the first floor   Functional History: Prior Function Level of Independence: Independent Comments: Patient was very active. he reports the day before his fall he went 10 miles on an exercise bike Functional Status:  Mobility: Bed Mobility Overal bed mobility: Needs Assistance Bed Mobility: Rolling, Sidelying to Sit Rolling: Min guard Sidelying to sit: Mod assist Sit to sidelying: Min assist General bed mobility comments: Cues for log roll technique, pt rolling towards right, modA for trunk elevation to upright Transfers Overall transfer level: Needs assistance Equipment used: Rolling walker (2 wheeled) Transfers: Sit to/from Stand Sit to Stand: Min assist Stand pivot transfers: Min assist General transfer comment: MinA to rise from elevated bed  height Ambulation/Gait Ambulation/Gait assistance: Min assist Gait Distance (Feet): 25 Feet Assistive device: Rolling walker (2 wheeled) Gait Pattern/deviations: Step-through pattern, Decreased dorsiflexion - left, Decreased stride length, Decreased step length - left, Trunk flexed General Gait Details: MinA for stability, pt tending to drag LLE especially with fatigue, cues for upright posture and walker proximity Gait velocity: decreased Gait velocity interpretation: <1.31 ft/sec, indicative of household ambulator    ADL: ADL Overall ADL's : Needs assistance/impaired Eating/Feeding: Independent, Sitting Grooming: Set up, Sitting Upper Body Bathing: Minimal assistance, Sitting Lower Body Bathing: Maximal assistance Lower Body Bathing Details (indicate cue type and reason): min A sit<>stand from recliner Upper Body Dressing : Moderate assistance, Sitting Lower Body Dressing: Total assistance Lower Body Dressing Details (indicate cue type and reason): min A sit<>stand Toilet Transfer: Minimal assistance, Stand-pivot, RW Toilet Transfer Details (indicate cue type and reason): recliner to bed Toileting- Clothing Manipulation and Hygiene: Total assistance Toileting - Clothing Manipulation Details (indicate cue type and reason): min A sit<>stand  Cognition: Cognition Overall Cognitive Status: Within Functional Limits for tasks assessed Orientation Level: Oriented X4 Cognition Arousal/Alertness: Awake/alert Behavior During Therapy: WFL for tasks assessed/performed Overall Cognitive Status: Within Functional Limits for tasks assessed General Comments: WFL for basic mobility tasks, is  tangential at times i.e. brings up his wife not wanting to get a colonoscopy when it was unrelated to topic/task at hand   Blood pressure 111/75, pulse 89, temperature 99.3 F (37.4 C), temperature source Oral, resp. rate 18, height 6' (1.829 m), weight 102 kg, SpO2 90 %.  Physical Exam  Nursing note  and vitals reviewed. General: Alert and oriented x 3, No apparent distress HEENT: Head is normocephalic, atraumatic, PERRLA, EOMI, sclera anicteric, oral mucosa pink and moist, dentition intact, ext ear canals clear,  Neck: Supple without JVD or lymphadenopathy Heart: Reg rate and rhythm. No murmurs rubs or gallops Chest: CTA bilaterally without wheezes, rales, or rhonchi; no distress Abdomen: Distended and tight.  Extremities: No clubbing, cyanosis, or edema. Pulses are 2+ Skin: Psoriatic patches on bilateral knees  Neuro: Pt is cognitively appropriate with normal insight, memory, and awareness. Cranial nerves 2-12 are intact. Fatigued. Motor function is grossly 4-/5 as patient is limited by fatigue, nausea, and pain.   Musculoskeletal: Full ROM, No pain with AROM or PROM in the neck, trunk, or extremities. Posture appropriate Psych: Pt's affect is appropriate. Pt is cooperative   No results found for this or any previous visit (from the past 24 hour(s)). No results found.   Assessment/Plan: Diagnosis: Impaired mobility and ADLs following T11 fracture 1. Does the need for close, 24 hr/day medical supervision in concert with the patient's rehab needs make it unreasonable for this patient to be served in a less intensive setting? Yes 2. Co-Morbidities requiring supervision/potential complications: depression, prostate cancer, DISH syndrome, peripheral neuropathy, seizure disorder 3. Due to bladder management, bowel management, safety, skin/wound care, disease management, medication administration, pain management and patient education, does the patient require 24 hr/day rehab nursing? Yes 4. Does the patient require coordinated care of a physician, rehab nurse, therapy disciplines of PT, OT to address physical and functional deficits in the context of the above medical diagnosis(es)? Yes Addressing deficits in the following areas: balance, endurance, locomotion, strength, transferring,  bowel/bladder control, bathing, dressing, feeding, grooming, toileting and psychosocial support 5. Can the patient actively participate in an intensive therapy program of at least 3 hrs of therapy per day at least 5 days per week? Yes 6. The potential for patient to make measurable gains while on inpatient rehab is excellent 7. Anticipated functional outcomes upon discharge from inpatient rehab are modified independent  with PT, modified independent with OT, independent with SLP. 8. Estimated rehab length of stay to reach the above functional goals is: 9-11 days 9. Anticipated discharge destination: Home 10. Overall Rehab/Functional Prognosis: excellent  RECOMMENDATIONS: This patient's condition is appropriate for continued rehabilitative care in the following setting: CIR Patient has agreed to participate in recommended program. Yes Note that insurance prior authorization may be required for reimbursement for recommended care.  Comment: Mr. Journigan would be an excellent CIR candidate. Currently with nausea, emesis, constipation, and pain.  Nausea and emesis may be secondary to constipation. May benefit from KUB to assess for ileus and enema to relieve constipation.  Given neuropathic pain, may benefit from scheduled Gabapentin 300mg  TID.   Thank you for this consult.   Bary Leriche, PA-C 01/11/2020   I have personally performed a face to face diagnostic evaluation, including, but not limited to relevant history and physical exam findings, of this patient and developed relevant assessment and plan.  Additionally, I have reviewed and concur with the physician assistant's documentation above.  Leeroy Cha, MD

## 2020-01-11 NOTE — Progress Notes (Addendum)
Neurosurgery Service Progress Note  Subjective: No acute events overnight, no new complaints, back soreness as expected  Objective: Vitals:   01/10/20 1945 01/10/20 2338 01/11/20 0359 01/11/20 0736  BP: 122/70 135/80 117/71 111/75  Pulse: 92 82 81 89  Resp: 18 18 18 18   Temp: (!) 97.5 F (36.4 C) 97.8 F (36.6 C) 98.2 F (36.8 C) 99.3 F (37.4 C)  TempSrc: Oral Oral Oral Oral  SpO2: 92% 98% 97% 90%  Weight:      Height:       Temp (24hrs), Avg:98.3 F (36.8 C), Min:97.5 F (36.4 C), Max:99.3 F (37.4 C)  CBC Latest Ref Rng & Units 01/09/2020 01/09/2020 01/08/2020  WBC 4.0 - 10.5 K/uL 12.6(H) 9.6 6.4  Hemoglobin 13.0 - 17.0 g/dL 13.3 13.4 15.9  Hematocrit 39.0 - 52.0 % 39.5 39.4 46.9  Platelets 150 - 400 K/uL 162 155 180   BMP Latest Ref Rng & Units 01/10/2020 01/09/2020 01/08/2020  Glucose 70 - 99 mg/dL 172(H) 190(H) 119(H)  BUN 8 - 23 mg/dL 13 11 12   Creatinine 0.61 - 1.24 mg/dL 1.03 1.07 1.23  BUN/Creat Ratio 10 - 24 - - -  Sodium 135 - 145 mmol/L 136 137 139  Potassium 3.5 - 5.1 mmol/L 3.9 4.0 4.2  Chloride 98 - 111 mmol/L 103 105 106  CO2 22 - 32 mmol/L 26 23 24   Calcium 8.9 - 10.3 mg/dL 8.0(L) 8.1(L) 8.7(L)    Intake/Output Summary (Last 24 hours) at 01/11/2020 0927 Last data filed at 01/11/2020 0800 Gross per 24 hour  Intake 280 ml  Output 30 ml  Net 250 ml    Current Facility-Administered Medications:  .  0.9 %  sodium chloride infusion (Manually program via Guardrails IV Fluids), , Intravenous, Once, Claris Che, CRNA .  acetaminophen (TYLENOL) tablet 650 mg, 650 mg, Oral, Q6H PRN, 650 mg at 01/10/20 1517 **OR** acetaminophen (TYLENOL) suppository 650 mg, 650 mg, Rectal, Q6H PRN, Carollee Leitz, MD .  heparin injection 5,000 Units, 5,000 Units, Subcutaneous, Q8H, Carollee Leitz, MD, 5,000 Units at 01/11/20 0509 .  HYDROmorphone (DILAUDID) injection 1 mg, 1 mg, Intravenous, Q2H PRN, Gladys Damme, MD .  lactated ringers infusion, , Intravenous, Continuous,  Belinda Block, MD, Stopped at 01/09/20 1553 .  levETIRAcetam (KEPPRA) tablet 1,000 mg, 1,000 mg, Oral, BID, Amie Portland, MD, 1,000 mg at 01/11/20 0906 .  MEDLINE mouth rinse, 15 mL, Mouth Rinse, BID, Andrena Mews T, MD, 15 mL at 01/11/20 0907 .  oxyCODONE (Oxy IR/ROXICODONE) immediate release tablet 5-10 mg, 5-10 mg, Oral, Q4H PRN, Gladys Damme, MD, 10 mg at 01/11/20 [id number]   Physical Exam: AOx3, PERRL, EOMI, FS, Strength 5/5 x4, SILTx4 except chronic b/l foot numbness Incision c/d/i  Assessment & Plan: 69 y.o. man s/p fall 2/2 seizure w/ flexion-distraction 3 column injury 2/2 DISH, recovering well.  -okay for prophylactic dose SQH -pt can wear brace for comfort, does not have to wear at all times -will put discharge instructions in epic for follow up / activity / etc  Judith Part  01/11/20 9:27 AM

## 2020-01-11 NOTE — TOC Transition Note (Signed)
Transition of Care [facility]) - CM/SW Discharge Note   Patient Details  Name: Benjamin [name] MRN: [id number] Date of Birth: [birth year]  [facility] of [facility] CM/SW Contact:  Pollie Friar, RN Phone Number: 01/11/2020, 4:29 PM   Clinical Narrative:    Pt discharging to CIR today. CM signing off.   Final next level of care: IP Rehab Facility Barriers to Discharge: No Barriers Identified   Patient Goals and CMS Choice        Discharge Placement                       Discharge Plan and Services                                     Social Determinants of Health (SDOH) Interventions     Readmission Risk Interventions No flowsheet data found.

## 2020-01-11 NOTE — Progress Notes (Signed)
Patient being transferred to 4W 9

## 2020-01-11 NOTE — Progress Notes (Addendum)
Inpatient Rehab Admissions:  Inpatient Rehab Consult received.  I met with patient at the bedside for rehabilitation assessment and to discuss goals and expectations of an inpatient rehab admission.  He is in a significant amount of pain, but agreeable to CIR.  Will f/u with family for support, and check back with pt in the PM for pain control, could potentially admit to CIR today.   Addendum: Dr. Chauncey Reading approved for d/c today. CM aware and I will let pt/family know.     Signed: Shann Medal, PT, DPT Admissions Coordinator [phone] 01/11/20  12:57 PM

## 2020-01-11 NOTE — Discharge Instructions (Signed)
Discharge Instructions  No restriction in activities, slowly increase your activity back to normal. You can wear your back brace if it improves your comfort, you do not have to wear it at all times or wear it if makes you less comfortable.  Your incision is closed with dermabond (purple glue). This will naturally fall off over the next 1-2 weeks.   Okay to shower on the day of discharge. Use regular soap and water and try to be gentle when cleaning your incision.   Follow up with Dr. Zada Finders in 2 weeks after discharge. If you do not already have a discharge appointment, please call his office at [phone] to schedule a follow up appointment. If you have any concerns or questions, please call the office and let us know.

## 2020-01-11 NOTE — H&P (Signed)
Physical Medicine and Rehabilitation Admission H&P    Chief Complaint  Patient presents with  .  T11 fracture with functional decline.        HPI: Benjamin [name] is a 69 year old male with history of BPH, DISH,  lumbar stenosis with peripheral neuropathy, psoriasis, seizure disorder who was admitted on 01/08/20 after a fall off a ladder due to seizures.  He was loaded with Keppra and dose increased to 1000 mg bid. He ws found to have unstable oblique coronal T11 vertebral body Fx with acute fractures on L1, L2 and L3 as well as L5-S1 left foraminal stenosis. He was taken to OR emergently for T9- L2 posterior fusion by Dr. Zada Finders and post op cleared to start Lovenox. To wear TLSO prn for comfort.  Oxycontin was added on 03/01 for more consistent pain control. Therapy evaluations showed decline in standing balace with pain affecting mobility and ADLs. CIR recommended due to functional decline.    Review of Systems  Constitutional: Negative for chills and fever.  HENT: Negative for hearing loss and tinnitus.   Eyes: Negative for blurred vision and double vision.  Respiratory: Negative for cough and shortness of breath.   Cardiovascular: Negative for chest pain and palpitations.  Gastrointestinal: Positive for abdominal pain and constipation. Negative for heartburn and nausea.  Genitourinary: Negative for dysuria.       Incontinent of bladder--wears depends. He has to get up 2-3 times a night to urinate  Musculoskeletal: Positive for back pain.  Skin: Negative for rash.  Neurological: Positive for sensory change (bilateral feet numb and decreased sensation LLE --chronic) and weakness. Negative for dizziness and headaches.  Psychiatric/Behavioral: Negative for depression. The patient has insomnia (has problems breathing/does not like the machine due to nocturia. ).     Past Medical History:  Diagnosis Date  . Acute prostatitis 07/03/12   s/p prostate   . Allergy   . Anxiety   .  Arthritis    stenosis lower back  . Back pain   . BPH (benign prostatic hypertrophy) with urinary obstruction   . Depression   . ED (erectile dysfunction)   . Headache(784.0)    "sinus" headaches  . History of ITP    as a child per patient  . Hypercholesterolemia   . Other forms of epilepsy and recurrent seizures without mention of intractable epilepsy 09/16/2013  . Peripheral axonal neuropathy 08/06/2014  . Prostate cancer ([place]) 07/03/12   Adenocarcinoma,gleason=3+3=6,PSA=5.0,volume=20cc  . Seizures ([place])   . Unspecified hereditary and idiopathic peripheral neuropathy 07/07/2014   Foot numbness starting 2010, hands beginning in 2014- 15 .    Past Surgical History:  Procedure Laterality Date  . BACK SURGERY    . COLONOSCOPY    . KNEE ARTHROSCOPY  2017  . LUMBAR LAMINECTOMY/DECOMPRESSION MICRODISCECTOMY N/A 12/23/2013   Procedure: Lumbar Laminectomy Decompression, Lumbar Two, Three, Four, Five;  Surgeon: Hosie Spangle, MD;  Location: [facility];  Service: Neurosurgery;  Laterality: N/A;  . LYMPHADENECTOMY Bilateral 05/25/2016   Procedure: BILATERAL PELVIC LYMPHADENECTOMY;  Surgeon: Alexis Frock, MD;  Location: [facility];  Service: Urology;  Laterality: Bilateral;  . ROBOT ASSISTED LAPAROSCOPIC RADICAL PROSTATECTOMY N/A 05/25/2016   Procedure: XI ROBOTIC ASSISTED LAPAROSCOPIC RADICAL PROSTATECTOMY WITH INDOCYANINE GREEN DYE;  Surgeon: Alexis Frock, MD;  Location: [facility];  Service: Urology;  Laterality: N/A;  . TOE SURGERY     Family History  Problem Relation Age of Onset  . Cancer Father 68  prostate/prostatectomy  . Cancer Cousin 42       prostate ca, also heart problems    Social History:  Married. Retired from [employer] fall 2019. Wife works reports. He that he has never smoked. He has never used smokeless tobacco. He used to drink beer--and has quit? Marland Kitchen He reports that he does not use drugs.   Allergies  Allergen Reactions  . Aspartame And Phenylalanine Other  (See Comments)    Powder causes seizures  . Benadryl [Diphenhydramine] Other (See Comments)    seizures  . Zoloft [Sertraline Hcl] Other (See Comments)    Confusion Memory loss    Medications Prior to Admission  Medication Sig Dispense Refill  . acetaminophen (TYLENOL) 500 MG tablet Take 2 tablets (1,000 mg total) by mouth every 6 (six) hours. 30 tablet 0  . alendronate (FOSAMAX) 70 MG tablet Take 70 mg by mouth once a week.     . levETIRAcetam (KEPPRA) 1000 MG tablet Take 1 tablet (1,000 mg total) by mouth 2 (two) times daily.    Marland Kitchen oxyCODONE (OXY IR/ROXICODONE) 5 MG immediate release tablet Take 1 tablet (5 mg total) by mouth every 6 (six) hours as needed for moderate pain or severe pain. 30 tablet 0  . oxyCODONE (OXYCONTIN) 10 mg 12 hr tablet Take 1 tablet (10 mg total) by mouth every 12 (twelve) hours. 14 tablet 0    Drug Regimen Review  Drug regimen was reviewed and remains appropriate with no significant issues identified  Home: Home Living Family/patient expects to be discharged to:: Private residence Living Arrangements: Spouse/significant other Available Help at Discharge: Family, Available 24 hours/day Type of Home: House Home Access: Stairs to enter [employer] of Steps: 3 Home Layout: Two level Alternate Level Stairs-Number of Steps: 12 Bathroom Shower/Tub: Tub/shower unit, Architectural technologist: Handicapped height Additional Comments: Patient has steps but his room and bathroom on on the first floor    Functional History: Prior Function Level of Independence: Independent Comments: Patient was very active. he reports the day before his fall he went 10 miles on an exercise bike  Functional Status:  Mobility: Bed Mobility Overal bed mobility: Needs Assistance Bed Mobility: Rolling, Sidelying to Sit Rolling: Min guard Sidelying to sit: Mod assist Sit to sidelying: Min assist General bed mobility comments: Cues for log roll technique, pt rolling  towards right, modA for trunk elevation to upright Transfers Overall transfer level: Needs assistance Equipment used: Rolling walker (2 wheeled) Transfers: Sit to/from Stand Sit to Stand: Min assist Stand pivot transfers: Min assist General transfer comment: MinA to rise from elevated bed height Ambulation/Gait Ambulation/Gait assistance: Min assist Gait Distance (Feet): 25 Feet Assistive device: Rolling walker (2 wheeled) Gait Pattern/deviations: Step-through pattern, Decreased dorsiflexion - left, Decreased stride length, Decreased step length - left, Trunk flexed General Gait Details: MinA for stability, pt tending to drag LLE especially with fatigue, cues for upright posture and walker proximity Gait velocity: decreased Gait velocity interpretation: <1.31 ft/sec, indicative of household ambulator  ADL: ADL Overall ADL's : Needs assistance/impaired Eating/Feeding: Independent, Sitting Grooming: Set up, Sitting Upper Body Bathing: Minimal assistance, Sitting Lower Body Bathing: Maximal assistance Lower Body Bathing Details (indicate cue type and reason): min A sit<>stand from recliner Upper Body Dressing : Moderate assistance, Sitting Lower Body Dressing: Total assistance Lower Body Dressing Details (indicate cue type and reason): min A sit<>stand Toilet Transfer: Minimal assistance, Stand-pivot, RW Toilet Transfer Details (indicate cue type and reason): recliner to bed Toileting- Clothing Manipulation and Hygiene: Total  assistance Toileting - Clothing Manipulation Details (indicate cue type and reason): min A sit<>stand  Cognition: Cognition Overall Cognitive Status: Within Functional Limits for tasks assessed Orientation Level: Oriented X4 Cognition Arousal/Alertness: Awake/alert Behavior During Therapy: WFL for tasks assessed/performed Overall Cognitive Status: Within Functional Limits for tasks assessed General Comments: WFL for basic mobility tasks, is tangential at  times i.e. brings up his wife not wanting to get a colonoscopy when it was unrelated to topic/task at hand    Blood pressure 111/81, pulse 98, temperature 99.4 F (37.4 C), resp. rate 16, height 6' (1.829 m), SpO2 96 %.   Physical Exam  Nursing note and vitals reviewed. General: Alert and oriented x 3, No apparent distress HEENT: Head is normocephalic, atraumatic, PERRLA, EOMI, sclera anicteric, oral mucosa pink and moist, dentition intact, ext ear canals clear,  Neck: Supple without JVD or lymphadenopathy Heart: Reg rate and rhythm. No murmurs rubs or gallops Chest: CTA bilaterally without wheezes, rales, or rhonchi; no distress Abdomen: Distended and tight.  Extremities: No clubbing, cyanosis, or edema. Pulses are 2+ Skin: Psoriatic patches on bilateral knees. Spinal incision dry and intact Neuro: Pt is cognitively appropriate with normal insight, memory, and awareness. Cranial nerves 2-12 are intact. Fatigued. Motor function is grossly 4-/5 as patient is limited by fatigue, nausea, and pain.   Musculoskeletal: Full ROM, No pain with AROM or PROM in the neck, trunk, or extremities. Posture appropriate Psych: Pt's affect is appropriate. Pt is cooperative   Results for orders placed or performed during the hospital encounter of 01/08/20 (from the past 48 hour(s))  Basic metabolic panel     Status: Abnormal   Collection Time: 01/10/20  5:09 AM  Result Value Ref Range   Sodium 136 135 - 145 mmol/L   Potassium 3.9 3.5 - 5.1 mmol/L   Chloride 103 98 - 111 mmol/L   CO2 26 22 - 32 mmol/L   Glucose, Bld 172 (H) 70 - 99 mg/dL    Comment: Glucose reference range applies only to samples taken after fasting for at least 8 hours.   BUN 13 8 - 23 mg/dL   Creatinine, Ser 1.03 0.61 - 1.24 mg/dL   Calcium 8.0 (L) 8.9 - 10.3 mg/dL   GFR calc non Af Amer >60 >60 mL/min   GFR calc Af Amer >60 >60 mL/min   Anion gap 7 5 - 15    Comment: Performed at [facility] [address]  [place], [place] [address]   No results found.     Medical Problem List and Plan: 1.  Impaired mobility and ADLs s/p T9-L2 posterior spinal instrumented fusion following fall due to seizure  -patient may shower but incision site must be covered  -ELOS/Goals: 9-11 days modI in PT and OT and I in SLP  -Use thoracolumbosacral orthotic when ambulating.  Spinal precautions.  2.  Antithrombotics: -DVT/anticoagulation:  Pharmaceutical: Lovenox  -antiplatelet therapy: N/A 3. Pain Management: Now on OxyContin 10 mg bid with oxycodone prn. Consider addition of Gabapentin 372mf TID for neuropathic pain.  4. Mood: LCSW to follow for evaluation and support. Has history of depression.   -antipsychotic agents: N/a 5. Neuropsych: This patient is capable of making decisions on his own behalf. 6. Skin/Wound Care: Monitor wound for healing.  7. Fluids/Electrolytes/Nutrition: Monitor I/O. Check lytes in am.  8. Seizures: History of nocturnal seizures. Breakthrough seizures---patient unable to recall home dose. Now on Keppra 1000 mg bid. 9. Ileus? : Abdominal distension with minimal bowel sounds. He  has not had a BM since admission. Will order KUB for work up. Start miralax bid and order enema for today 10. Leucocytosis: Low graded fevers noted--will check UA/UCS. Monitor for signs of infection. 11. Hyperglycemia: Elevated fasting BS--will order ac/hs cbg X 2-3 days and check Hgb A1C.  12.  Chronic insomnia: Due to anxiety? Has declined sleep study. Will add xanax prn  13. Peripheral axonal neuropathy: Numbness bilateral feet and hands as well as LLE.   Reesa Chew, PA-C  I have personally performed a face to face diagnostic evaluation, including, but not limited to relevant history and physical exam findings, of this patient and developed relevant assessment and plan.  Additionally, I have reviewed and concur with the physician assistant's documentation above.  [name] status has not changed. The  original post admission physician evaluation remains appropriate, and any changes from the pre-admission screening or documentation from the acute chart are noted above.   Leeroy Cha, MD

## 2020-01-11 NOTE — Progress Notes (Signed)
Family Medicine Teaching Service Daily Progress Note Intern Pager: [phone]  Patient name: Benjamin [name] Medical record number: [id number] Date of birth: [birth year] Age: 69 y.o. Gender: male  Primary Care Provider: Cyndi Bender, PA-C Consultants: Neurology, Neurosurgery Code Status: Full  Pt Overview and Major Events to Date:  2/26: admitted, multi level fusion T9-L3 by neurosx 2/28: JP drain removed  Assessment and Plan: Benjamin [name] is a 69 y.o. male presenting with seizure. PMH is significant for seizures, anxiety, depression. Back pain, peripheral neuropathy. Presented to the ED after a fall from a ladder due to breakthrough seizure. Found to have multiple unstable vertebral body fractures from T9-L2 and had multilevel fixation.  Seizure Activity No seizure activity since admission.  Keppra was increased yesterday. Neuro exam benign.  No focal deficits. Patient is medically stable and ready for disposition to CIR. -Continue Keppra 1 g twice daily, follow-up in 2-4 weeks with neurology, will need brain MRI with/without contrast but can be done with outpatient.  -Continue neuro signs every 4 -Continue vital signs per unit -Continue seizure cautions -Lovenox for DVT ppx -BMP, CBC qod  T11 fracture secondary to fall from seizure POD#2 T9-L2 posterior spinal instrumentation. Drain removed yesterday evening, 2/28. Pain controlled with PO medication. -Neurosurgery following -Regular diet -Continue Oxycodone 5-10 mg q4h for moderate pain  FEN/GI:  -Normal diet PPx:  -Lovenox  Disposition: to CIR pending insurance approval  Subjective:  No acute events overnight.    Objective: Temp:  [97.5 F (36.4 C)-99 F (37.2 C)] 98.2 F (36.8 C) (03/01 0359) Pulse Rate:  [81-92] 81 (03/01 0359) Resp:  [18-20] 18 (03/01 0359) BP: (106-137)/(68-83) 117/71 (03/01 0359) SpO2:  [90 %-98 %] 97 % (03/01 0359) Physical Exam:  General: 69 year old male in no acute  distress Cardiovascular: Regular rate and rhythm, no murmurs or gallops appreciated, distal pulses present Respiratory: Chest clear to auscultation bilaterally, no wheezes or crackles noted, good cap refill Abdomen: Soft, nontender, nondistended, bowel sounds present Extremities: Moves all extremities, no lower extremity edema Neuro: Alert and oriented x4, CN III-XII intact, motor and sensation normal  Laboratory: Recent Labs  Lab 01/08/20 1330 01/09/20 0253 01/09/20 0823  WBC 6.4 9.6 12.6*  HGB 15.9 13.4 13.3  HCT 46.9 39.4 39.5  PLT 180 155 162   Recent Labs  Lab 01/08/20 1330 01/09/20 0253 01/10/20 0509  NA 139 137 136  K 4.2 4.0 3.9  CL 106 105 103  CO2 24 23 26   BUN 12 11 13   CREATININE 1.23 1.07 1.03  CALCIUM 8.7* 8.1* 8.0*  PROT  --  5.7*  --   BILITOT  --  0.8  --   ALKPHOS  --  42  --   ALT  --  22  --   AST  --  25  --   GLUCOSE 119* 190* 172*   CK: 288 COVID: negative HIV: negative  Imaging/Diagnostic Tests: CT Head non-con:  IMPRESSION: No acute intracranial hemorrhage, mass effect, or evidence of acute Infarction. CT Thoracic and Lumbar Spine: 1. Acute coronal oblique fracture through the T11 vertebral body with widening of the anterior aspect of the disc space at T11-12 without neural impingement. 2. No other acute abnormality of the thoracic spine. Osteophytes fuse the thoracic spine from T5 to T11. 1. Acute fractures of the left transverse processes of L1, L2, and L3. Old healed compression fractures of L1 and L5. 2. Left foraminal stenosis at L5-S1 which could affect the left L5 nerve.  Gladys Damme, MD  01/11/2020, 4:59 AM PGY-1, [place] Intern pager: [phone], text pages welcome

## 2020-01-11 NOTE — Progress Notes (Signed)
Patient arrived from [address] [place] via wheelchair, Patient appears alert and expresses some discomfort. He was wearing the TLSO brace.Assigned to [phone] [facility].

## 2020-01-11 NOTE — Progress Notes (Signed)
Physical Therapy Treatment Patient Details Name: Benjamin [name] MRN: [id number] DOB: Sep 26, 1951 Today's Date: 01/11/2020    History of Present Illness Patient is a 69 year old male who was up on stepladder changing out batteries in smoke detector fell due to seizure/fell and had seizure. CT: prior L5 and L1 frx, new coronally-oriented fracture through the body of T12 that extends into the T11-12 disc space with fish-mouthing of the disc space, fractures of the L1-L3 TPs on the left.Pt now s/pT9-L2 posterior spinal instrumented fusion. PMH Seizueres, depression, prostate cancer, BPH,     PT Comments    Pt slow to move and stiff/painful at his incision site, but was able to walk to the bathroom and back to the transport chair to be moved to CIR this evening.  He walks over flexed knees with slow, shuffling cadence, left leg fatiguing before R and is very reliant on his UEs for support in standing and during gait.  Cues for upright posture and back precaution education reinforced.  PT assisted with transfer to CIR and pt wanted to remain up in the recliner chair.   Follow Up Recommendations  CIR     Equipment Recommendations  Rolling walker with 5" wheels;3in1 (PT)    Recommendations for Other Services   NA     Precautions / Restrictions Precautions Precautions: Back Required Braces or Orthoses: Spinal Brace Spinal Brace: Thoracolumbosacral orthotic(when ambulating)    Mobility  Bed Mobility               General bed mobility comments: Pt was OOB in the recliner chair.   Transfers Overall transfer level: Needs assistance Equipment used: Rolling walker (2 wheeled) Transfers: Sit to/from Stand Sit to Stand: Min assist Stand pivot transfers: Min assist       General transfer comment: Slow transitions, cues for safe hand placement.  Stood from Psychologist, occupational, commode, and transport chair.    Ambulation/Gait Ambulation/Gait assistance: Min assist   Assistive device: Rolling walker  (2 wheeled) Gait Pattern/deviations: Step-through pattern;Decreased dorsiflexion - left;Decreased stride length;Decreased step length - left;Trunk flexed Gait velocity: decreased Gait velocity interpretation: <1.31 ft/sec, indicative of household ambulator General Gait Details: Min assist for balance and trunk support over flexed knees.  More difficulty progressing left leg which is more evident as he fatigues.  Cues for upright posture and closer proximity to RW.           Balance Overall balance assessment: Needs assistance Sitting-balance support: Feet supported;Bilateral upper extremity supported Sitting balance-Leahy Scale: Fair     Standing balance support: Bilateral upper extremity supported Standing balance-Leahy Scale: Poor Standing balance comment: reliant on at least one UE supported during ADL task of washing his hands at the sink.  Unable to pull up his depends without assistance.                             Cognition Arousal/Alertness: Awake/alert Behavior During Therapy: WFL for tasks assessed/performed Overall Cognitive Status: Within Functional Limits for tasks assessed                                               Pertinent Vitals/Pain Pain Assessment: Faces Faces Pain Scale: Hurts even more Pain Location: back Pain Descriptors / Indicators: Aching;Guarding;Grimacing Pain Intervention(s): Limited activity within patient's tolerance;Monitored during session;Repositioned  PT Goals (current goals can now be found in the care plan section) Acute Rehab PT Goals Patient Stated Goal: lessen pain Progress towards PT goals: Progressing toward goals    Frequency    Min 5X/week      PT Plan Current plan remains appropriate       AM-PAC PT "6 Clicks" Mobility   Outcome Measure  Help needed turning from your back to your side while in a flat bed without using bedrails?: A Little Help needed moving from lying on  your back to sitting on the side of a flat bed without using bedrails?: A Lot Help needed moving to and from a bed to a chair (including a wheelchair)?: A Little Help needed standing up from a chair using your arms (e.g., wheelchair or bedside chair)?: A Little Help needed to walk in hospital room?: A Little Help needed climbing 3-5 steps with a railing? : A Lot 6 Click Score: 16    End of Session Equipment Utilized During Treatment: Back brace Activity Tolerance: Patient limited by pain Patient left: in chair;with nursing/sitter in room;Other (comment)(in recliner on inpatient rehab with RN/RN tech in room. )   PT Visit Diagnosis: Difficulty in walking, not elsewhere classified (R26.2);Other symptoms and signs involving the nervous system (R29.898);Pain Pain - Right/Left: (central/incisional) Pain - part of body: (back)     Time: [id number] PT Time Calculation (min) (ACUTE ONLY): 40 min  Charges:  $Gait Training: 23-37 mins $Therapeutic Activity: 8-22 mins                    Verdene Lennert, PT, DPT  Acute Rehabilitation [phone] pager #[phone] office  @ [facility]: [phone]   01/11/2020, 6:04 PM

## 2020-01-11 NOTE — PMR Pre-admission (Signed)
PMR Admission Coordinator Pre-Admission Assessment  Patient: Benjamin [name] is an 69 y.o., male MRN: [id number] DOB: 1951/09/25 Height: 6' (182.9 cm) Weight: 102 kg              Insurance Information HMO:     PPO:      PCP:      IPA:      80/20:      OTHER:  PRIMARY: Medicare A and B      Policy#: [id number]      Subscriber: pt CM Name:       Phone#:      Fax#:  Pre-Cert#: verified Civil engineer, contracting: n/a Benefits:  Phone #:      Name:  Eff. Date: A 01/10/18, B 12/13/18     Deduct: $1484      Out of Pocket Max: n/a      Life Max: n/a CIR: 10%      SNF: 20 full days Outpatient: 80%     Co-Pay: 20% Home Health: 100%      Co-Pay:  DME: 80%     Co-Pay: 20% Providers: pt choice SECONDARY: [employer]      Policy#: [id number]      Subscriber:  CM Name:       Phone#:      Fax#:  Pre-Cert#:       Employer:  Benefits:  Phone #: [phone]     Name:  Eff. Date:      Deduct:       Out of Pocket Max:       Life Max:  CIR:       SNF:  Outpatient:      Co-Pay:  Home Health:       Co-Pay:  DME:      Co-Pay:   Medicaid Application Date:       Case Manager:  Disability Application Date:       Case Worker:   The "Data Collection Information Summary" for patients in Inpatient Rehabilitation Facilities with attached "Privacy Act [place] Records" was provided and verbally reviewed with: Patient  Emergency Contact Information Contact Information    Name Relation Home Work Mobile   Carter-Forgy,Ellen Spouse [phone]  [phone]   Dorien, Denno Daughter [phone]       Current Medical History  Patient Admitting Diagnosis: T11 fracture, s/p T9-L1 posterior fusion  History of Present Illness: Benjamin [name] is a 69 y.o. male with history of depression, prostate cancer, back pain with neuropathy, seizure disorder who was admitted on 01/08/20 after a fall off the ladder due to seizure. He was loaded with Keppra and work up done revealing oblique coronal T11 vertebral body Fx and acute  fractures of L1,L2 and L3 transverse processes as well as L5-S1 left foraminal stenosis. Patient with reports of back pain and unstable closed thoracic fracture. He was taken to OR emergently for T9-L2 posterior fusion by Dr. Zada Finders.  Post op to wear TLSO for comfort, PRN, and Lovenox added for DVT propylaxis. Therapy evaluations done revealing decreased in standing balance and moderate pain with activity. CIR recommended due to functional deficits.   Past Medical History  Past Medical History:  Diagnosis Date  . Acute prostatitis 07/03/12   s/p prostate   . Allergy   . Anxiety   . Arthritis    stenosis lower back  . Back pain   . BPH (benign prostatic hypertrophy) with urinary obstruction   . Depression   . ED (erectile dysfunction)   .  Headache(784.0)    "sinus" headaches  . History of ITP    as a child per patient  . Hypercholesterolemia   . Other forms of epilepsy and recurrent seizures without mention of intractable epilepsy 09/16/2013  . Peripheral axonal neuropathy 08/06/2014  . Prostate cancer ([place]) 07/03/12   Adenocarcinoma,gleason=3+3=6,PSA=5.0,volume=20cc  . Seizures ([place])   . Unspecified hereditary and idiopathic peripheral neuropathy 07/07/2014   Foot numbness starting 2010, hands beginning in 2014- 15 .    Family History  family history includes Cancer (age of onset: 35) in his cousin and father.  Prior Rehab/Hospitalizations:  Has the patient had prior rehab or hospitalizations prior to admission? No  Has the patient had major surgery during 100 days prior to admission? Yes  Current Medications   Current Facility-Administered Medications:  .  0.9 %  sodium chloride infusion (Manually program via Guardrails IV Fluids), , Intravenous, Once, Claris Che, CRNA .  acetaminophen (TYLENOL) tablet 1,000 mg, 1,000 mg, Oral, Q6H WA, Gladys Damme, MD, 1,000 mg at 01/11/20 1337 .  enoxaparin (LOVENOX) injection 40 mg, 40 mg, Subcutaneous, Q24H, Meccariello, Bailey  J, DO, 40 mg at 01/11/20 1337 .  lactated ringers infusion, , Intravenous, Continuous, Belinda Block, MD, Stopped at 01/09/20 1553 .  levETIRAcetam (KEPPRA) tablet 1,000 mg, 1,000 mg, Oral, BID, Amie Portland, MD, 1,000 mg at 01/11/20 0906 .  MEDLINE mouth rinse, 15 mL, Mouth Rinse, BID, Andrena Mews T, MD, 15 mL at 01/11/20 0907 .  oxyCODONE (Oxy IR/ROXICODONE) immediate release tablet 5 mg, 5 mg, Oral, Q6H PRN, Gladys Damme, MD .  oxyCODONE (OXYCONTIN) 12 hr tablet 10 mg, 10 mg, Oral, Q12H, Gladys Damme, MD, 10 mg at 01/11/20 1337  Patients Current Diet:  Diet Order            Diet regular Room service appropriate? Yes; Fluid consistency: Thin  Diet effective now              Precautions / Restrictions Precautions Precautions: Back Precaution Booklet Issued: Yes (comment) Precaution Comments: BLT percautions (original note said no brace new [place]te says TLSO) Spinal Brace: Thoracolumbosacral orthotic("only when ambulating") Restrictions Weight Bearing Restrictions: No   Has the patient had 2 or more falls or a fall with injury in the past year?Yes  Prior Activity Level Community (5-7x/wk): recently retired from the post office, independent, no dme used  Prior Functional Level Prior Function Level of Independence: Independent Comments: Patient was very active. he reports the day before his fall he went 10 miles on an exercise bike  Self Care: Did the patient need help bathing, dressing, using the toilet or eating?  Independent  Indoor Mobility: Did the patient need assistance with walking from room to room (with or without device)? Independent  Stairs: Did the patient need assistance with internal or external stairs (with or without device)? Independent  Functional Cognition: Did the patient need help planning regular tasks such as shopping or remembering to take medications? Independent  Home Assistive Devices / Equipment Home Assistive Devices/Equipment:  Eyeglasses  Prior Device Use: Indicate devices/aids used by the patient prior to current illness, exacerbation or injury? None of the above  Current Functional Level Cognition  Overall Cognitive Status: Within Functional Limits for tasks assessed Orientation Level: Oriented X4 General Comments: WFL for basic mobility tasks, is tangential at times i.e. brings up his wife not wanting to get a colonoscopy when it was unrelated to topic/task at hand    Extremity Assessment (includes Sensation/Coordination)  Upper Extremity Assessment:  Generalized weakness  Lower Extremity Assessment: Generalized weakness    ADLs  Overall ADL's : Needs assistance/impaired Eating/Feeding: Independent, Sitting Grooming: Set up, Sitting Upper Body Bathing: Minimal assistance, Sitting Lower Body Bathing: Maximal assistance Lower Body Bathing Details (indicate cue type and reason): min A sit<>stand from recliner Upper Body Dressing : Moderate assistance, Sitting Lower Body Dressing: Total assistance Lower Body Dressing Details (indicate cue type and reason): min A sit<>stand Toilet Transfer: Minimal assistance, Stand-pivot, RW Toilet Transfer Details (indicate cue type and reason): recliner to bed Toileting- Clothing Manipulation and Hygiene: Total assistance Toileting - Clothing Manipulation Details (indicate cue type and reason): min A sit<>stand    Mobility  Overal bed mobility: Needs Assistance Bed Mobility: Rolling, Sidelying to Sit Rolling: Min guard Sidelying to sit: Mod assist Sit to sidelying: Min assist General bed mobility comments: Cues for log roll technique, pt rolling towards right, modA for trunk elevation to upright    Transfers  Overall transfer level: Needs assistance Equipment used: Rolling walker (2 wheeled) Transfers: Sit to/from Stand Sit to Stand: Min assist Stand pivot transfers: Min assist General transfer comment: MinA to rise from elevated bed height    Ambulation /  Gait / Stairs / Wheelchair Mobility  Ambulation/Gait Ambulation/Gait assistance: Herbalist (Feet): 25 Feet Assistive device: Rolling walker (2 wheeled) Gait Pattern/deviations: Step-through pattern, Decreased dorsiflexion - left, Decreased stride length, Decreased step length - left, Trunk flexed General Gait Details: MinA for stability, pt tending to drag LLE especially with fatigue, cues for upright posture and walker proximity Gait velocity: decreased Gait velocity interpretation: <1.31 ft/sec, indicative of household ambulator    Posture / Balance Dynamic Sitting Balance Sitting balance - Comments: used hands to take pressure off his back Balance Overall balance assessment: Needs assistance Sitting-balance support: No upper extremity supported, Feet supported Sitting balance-Leahy Scale: Fair Sitting balance - Comments: used hands to take pressure off his back Standing balance support: Bilateral upper extremity supported Standing balance-Leahy Scale: Poor Standing balance comment: walker and additional external A for balance due to pain    Special needs/care consideration BiPAP/CPAP no CPM no Continuous Drip IV no Dialysis no        Days n/a Life Vest no Oxygen no Special Bed no Trach Size no Wound Vac (area) no      Location n/a Skin incision to back                     Bowel mgmt: continent Bladder mgmt: continent Diabetic mgmt: no Behavioral consideration no Chemo/radiation no     Previous Home Environment (from acute therapy documentation) Living Arrangements: Spouse/significant other Available Help at Discharge: Family, Available 24 hours/day Type of Home: House Home Layout: Two level Alternate Level Stairs-Number of Steps: 12 Home Access: Stairs to enter [employer] of Steps: 3 Bathroom Shower/Tub: Public librarian, Architectural technologist: Handicapped height Home Care Services: No Additional Comments: Patient has steps but his room  and bathroom on on the first floor   Discharge Living Setting Plans for Discharge Living Setting: Patient's home Type of Home at Discharge: House Discharge Home Layout: Two level Alternate Level Stairs-Number of Steps: 12 Discharge Home Access: Stairs to enter Entrance Stairs-Rails: Can reach both Entrance Stairs-Number of Steps: 3-4 Discharge Bathroom Shower/Tub: Tub/shower unit Discharge Bathroom Toilet: Handicapped height Discharge Bathroom Accessibility: Yes How Accessible: Accessible via walker Does the patient have any problems obtaining your medications?: No  Social/Family/Support Systems Patient Roles: Spouse, Parent Anticipated Caregiver: Johnattan Crask (  spouse) Anticipated Caregiver's Contact Information: [name] [phone] (c), [phone] (w), [phone] (h) Ability/Limitations of Caregiver: works days, can ensure 24/7 between hired help and daughter Caregiver Availability: 24/7 Discharge Plan Discussed with Primary Caregiver: Yes Is Caregiver In Agreement with Plan?: Yes Does Caregiver/Family have Issues with Lodging/Transportation while Pt is in Rehab?: No   Goals/Additional Needs Patient/Family Goal for Rehab: PT/OT supervision to mod I Expected length of stay: 9-13 days Dietary Needs: reg/thin Pt/Family Agrees to Admission and willing to participate: Yes Program Orientation Provided & Reviewed with Pt/Caregiver Including Roles  & Responsibilities: Yes   Decrease burden of Care through IP rehab admission: n/a  Possible need for SNF placement upon discharge: Not anticipated  Patient Condition: This patient's condition remains as documented in the consult dated 01/11/2020, in which the Rehabilitation Physician determined and documented that the patient's condition is appropriate for intensive rehabilitative care in an inpatient rehabilitation facility. Will admit to inpatient rehab today.  Preadmission Screen Completed By:  Michel Santee, PT, DPT  01/11/2020 3:11 PM ______________________________________________________________________   Discussed status with Dr. Ranell Patrick on 01/11/20 at 3:17 PM  and received approval for admission today.  Admission Coordinator:  Michel Santee, PT, DPT time 3:17 PM Sudie Grumbling 01/11/20

## 2020-01-11 NOTE — Plan of Care (Signed)
  Problem: Education: Goal: Knowledge of General Education information will improve Description Including pain rating scale, medication(s)/side effects and non-pharmacologic comfort measures Outcome: Progressing   

## 2020-01-11 NOTE — Care Management Important Message (Signed)
Important Message  Patient Details  Name: Benjamin [name] MRN: [id number] Date of Birth: Oct 01, 1951   Medicare Important Message Given:  Yes     Orbie Pyo 01/11/2020, 12:53 PM

## 2020-01-11 NOTE — Progress Notes (Signed)
PMR Admission Coordinator Pre-Admission Assessment   Patient: Benjamin [name] is an 69 y.o., male MRN: [id number] DOB: [birth year] Height: 6' (182.9 cm) Weight: 102 kg                                                                                                                                                  Insurance Information HMO:     PPO:      PCP:      IPA:      80/20:      OTHER:  PRIMARY: Medicare A and B      Policy#: [id number]      Subscriber: pt CM Name:       Phone#:      Fax#:  Pre-Cert#: verified Civil engineer, contracting: n/a Benefits:  Phone #:      Name:  Eff. Date: A 01/10/18, B 12/13/18     Deduct: $1484      Out of Pocket Max: n/a      Life Max: n/a CIR: 10%      SNF: 20 full days Outpatient: 80%     Co-Pay: 20% Home Health: 100%      Co-Pay:  DME: 80%     Co-Pay: 20% Providers: pt choice SECONDARY: [employer]      Policy#: [id number]      Subscriber:  CM Name:       Phone#:      Fax#:  Pre-Cert#:       Employer:  Benefits:  Phone #: [phone]     Name:  Eff. Date:      Deduct:       Out of Pocket Max:       Life Max:  CIR:       SNF:  Outpatient:      Co-Pay:  Home Health:       Co-Pay:  DME:      Co-Pay:    Medicaid Application Date:       Case Manager:  Disability Application Date:       Case Worker:    The "Data Collection Information Summary" for patients in Inpatient Rehabilitation Facilities with attached "Privacy Act [place] Records" was provided and verbally reviewed with: Patient   Emergency Contact Information         Contact Information     Name Relation Home Work Mobile    Carter-Mckey,Ellen Spouse [phone]   [phone]    Keeton, Garges Daughter [phone]           Current Medical History  Patient Admitting Diagnosis: T11 fracture, s/p T9-L1 posterior fusion   History of Present Illness: Benjamin [name] is a 69 y.o. male with history of depression, prostate cancer, back pain with neuropathy, seizure disorder who was admitted on  01/08/20 after a fall off the ladder due to seizure.  He was loaded with Keppra and work up done revealing oblique coronal T11 vertebral body Fx and acute fractures of L1,L2 and L3 transverse processes as well as L5-S1 left foraminal stenosis. Patient with reports of back pain and unstable closed thoracic fracture. He was taken to OR emergently for T9-L2 posterior fusion by Dr. Zada Finders.  Post op to wear TLSO for comfort, PRN, and Lovenox added for DVT propylaxis. Therapy evaluations done revealing decreased in standing balance and moderate pain with activity. CIR recommended due to functional deficits.    Past Medical History      Past Medical History:  Diagnosis Date  . Acute prostatitis 07/03/12    s/p prostate   . Allergy    . Anxiety    . Arthritis      stenosis lower back  . Back pain    . BPH (benign prostatic hypertrophy) with urinary obstruction    . Depression    . ED (erectile dysfunction)    . Headache(784.0)      "sinus" headaches  . History of ITP      as a child per patient  . Hypercholesterolemia    . Other forms of epilepsy and recurrent seizures without mention of intractable epilepsy 09/16/2013  . Peripheral axonal neuropathy 08/06/2014  . Prostate cancer ([place]) 07/03/12    Adenocarcinoma,gleason=3+3=6,PSA=5.0,volume=20cc  . Seizures ([place])    . Unspecified hereditary and idiopathic peripheral neuropathy 07/07/2014    Foot numbness starting 2010, hands beginning in 2014- 15 .      Family History  family history includes Cancer (age of onset: 1) in his cousin and father.   Prior Rehab/Hospitalizations:  Has the patient had prior rehab or hospitalizations prior to admission? No   Has the patient had major surgery during 100 days prior to admission? Yes   Current Medications    Current Facility-Administered Medications:  .  0.9 %  sodium chloride infusion (Manually program via Guardrails IV Fluids), , Intravenous, Once, Claris Che, CRNA .  acetaminophen  (TYLENOL) tablet 1,000 mg, 1,000 mg, Oral, Q6H WA, Gladys Damme, MD, 1,000 mg at 01/11/20 1337 .  enoxaparin (LOVENOX) injection 40 mg, 40 mg, Subcutaneous, Q24H, Meccariello, Bailey J, DO, 40 mg at 01/11/20 1337 .  lactated ringers infusion, , Intravenous, Continuous, Belinda Block, MD, Stopped at 01/09/20 1553 .  levETIRAcetam (KEPPRA) tablet 1,000 mg, 1,000 mg, Oral, BID, Amie Portland, MD, 1,000 mg at 01/11/20 0906 .  MEDLINE mouth rinse, 15 mL, Mouth Rinse, BID, Andrena Mews T, MD, 15 mL at 01/11/20 0907 .  oxyCODONE (Oxy IR/ROXICODONE) immediate release tablet 5 mg, 5 mg, Oral, Q6H PRN, Gladys Damme, MD .  oxyCODONE (OXYCONTIN) 12 hr tablet 10 mg, 10 mg, Oral, Q12H, Gladys Damme, MD, 10 mg at 01/11/20 1337   Patients Current Diet:     Diet Order                      Diet regular Room service appropriate? Yes; Fluid consistency: Thin  Diet effective now                   Precautions / Restrictions Precautions Precautions: Back Precaution Booklet Issued: Yes (comment) Precaution Comments: BLT percautions (original note said no brace new notte says TLSO) Spinal Brace: Thoracolumbosacral orthotic("only when ambulating") Restrictions Weight Bearing Restrictions: No    Has the patient had 2 or more falls or a fall with injury in the past year?Yes   Prior Activity Level Community (5-7x/wk):  recently retired from the post office, independent, no dme used   Prior Functional Level Prior Function Level of Independence: Independent Comments: Patient was very active. he reports the day before his fall he went 10 miles on an exercise bike   Self Care: Did the patient need help bathing, dressing, using the toilet or eating?  Independent   Indoor Mobility: Did the patient need assistance with walking from room to room (with or without device)? Independent   Stairs: Did the patient need assistance with internal or external stairs (with or without device)?  Independent   Functional Cognition: Did the patient need help planning regular tasks such as shopping or remembering to take medications? Independent   Home Assistive Devices / Equipment Home Assistive Devices/Equipment: Eyeglasses   Prior Device Use: Indicate devices/aids used by the patient prior to current illness, exacerbation or injury? None of the above   Current Functional Level Cognition   Overall Cognitive Status: Within Functional Limits for tasks assessed Orientation Level: Oriented X4 General Comments: WFL for basic mobility tasks, is tangential at times i.e. brings up his wife not wanting to get a colonoscopy when it was unrelated to topic/task at hand    Extremity Assessment (includes Sensation/Coordination)   Upper Extremity Assessment: Generalized weakness  Lower Extremity Assessment: Generalized weakness     ADLs   Overall ADL's : Needs assistance/impaired Eating/Feeding: Independent, Sitting Grooming: Set up, Sitting Upper Body Bathing: Minimal assistance, Sitting Lower Body Bathing: Maximal assistance Lower Body Bathing Details (indicate cue type and reason): min A sit<>stand from recliner Upper Body Dressing : Moderate assistance, Sitting Lower Body Dressing: Total assistance Lower Body Dressing Details (indicate cue type and reason): min A sit<>stand Toilet Transfer: Minimal assistance, Stand-pivot, RW Toilet Transfer Details (indicate cue type and reason): recliner to bed Toileting- Clothing Manipulation and Hygiene: Total assistance Toileting - Clothing Manipulation Details (indicate cue type and reason): min A sit<>stand     Mobility   Overal bed mobility: Needs Assistance Bed Mobility: Rolling, Sidelying to Sit Rolling: Min guard Sidelying to sit: Mod assist Sit to sidelying: Min assist General bed mobility comments: Cues for log roll technique, pt rolling towards right, modA for trunk elevation to upright     Transfers   Overall transfer level:  Needs assistance Equipment used: Rolling walker (2 wheeled) Transfers: Sit to/from Stand Sit to Stand: Min assist Stand pivot transfers: Min assist General transfer comment: MinA to rise from elevated bed height     Ambulation / Gait / Stairs / Wheelchair Mobility   Ambulation/Gait Ambulation/Gait assistance: Herbalist (Feet): 25 Feet Assistive device: Rolling walker (2 wheeled) Gait Pattern/deviations: Step-through pattern, Decreased dorsiflexion - left, Decreased stride length, Decreased step length - left, Trunk flexed General Gait Details: MinA for stability, pt tending to drag LLE especially with fatigue, cues for upright posture and walker proximity Gait velocity: decreased Gait velocity interpretation: <1.31 ft/sec, indicative of household ambulator     Posture / Balance Dynamic Sitting Balance Sitting balance - Comments: used hands to take pressure off his back Balance Overall balance assessment: Needs assistance Sitting-balance support: No upper extremity supported, Feet supported Sitting balance-Leahy Scale: Fair Sitting balance - Comments: used hands to take pressure off his back Standing balance support: Bilateral upper extremity supported Standing balance-Leahy Scale: Poor Standing balance comment: walker and additional external A for balance due to pain     Special needs/care consideration BiPAP/CPAP no CPM no Continuous Drip IV no Dialysis no  Days n/a Life Vest no Oxygen no Special Bed no Trach Size no Wound Vac (area) no      Location n/a Skin incision to back                     Bowel mgmt: continent Bladder mgmt: continent Diabetic mgmt: no Behavioral consideration no Chemo/radiation no        Previous Home Environment (from acute therapy documentation) Living Arrangements: Spouse/significant other Available Help at Discharge: Family, Available 24 hours/day Type of Home: House Home Layout: Two level Alternate Level  Stairs-Number of Steps: 12 Home Access: Stairs to enter [employer] of Steps: 3 Bathroom Shower/Tub: Public librarian, Architectural technologist: Handicapped height Home Care Services: No Additional Comments: Patient has steps but his room and bathroom on on the first floor    Discharge Living Setting Plans for Discharge Living Setting: Patient's home Type of Home at Discharge: House Discharge Home Layout: Two level Alternate Level Stairs-Number of Steps: 12 Discharge Home Access: Stairs to enter Entrance Stairs-Rails: Can reach both Entrance Stairs-Number of Steps: 3-4 Discharge Bathroom Shower/Tub: Tub/shower unit Discharge Bathroom Toilet: Handicapped height Discharge Bathroom Accessibility: Yes How Accessible: Accessible via walker Does the patient have any problems obtaining your medications?: No   Social/Family/Support Systems Patient Roles: Spouse, Parent Anticipated Caregiver: Kenyatte Daluz (spouse) Anticipated Caregiver's Contact Information: [name] [phone] (c), [phone] (w), [phone] (h) Ability/Limitations of Caregiver: works days, can ensure 24/7 between hired help and daughter Caregiver Availability: 24/7 Discharge Plan Discussed with Primary Caregiver: Yes Is Caregiver In Agreement with Plan?: Yes Does Caregiver/Family have Issues with Lodging/Transportation while Pt is in Rehab?: No     Goals/Additional Needs Patient/Family Goal for Rehab: PT/OT supervision to mod I Expected length of stay: 9-13 days Dietary Needs: reg/thin Pt/Family Agrees to Admission and willing to participate: Yes Program Orientation Provided & Reviewed with Pt/Caregiver Including Roles  & Responsibilities: Yes     Decrease burden of Care through IP rehab admission: n/a   Possible need for SNF placement upon discharge: Not anticipated   Patient Condition: This patient's condition remains as documented in the consult dated 01/11/2020, in which the  Rehabilitation Physician determined and documented that the patient's condition is appropriate for intensive rehabilitative care in an inpatient rehabilitation facility. Will admit to inpatient rehab today.   Preadmission Screen Completed By:  Michel Santee, PT, DPT 01/11/2020 3:11 PM ______________________________________________________________________   Discussed status with Dr. Ranell Patrick on 01/11/20 at 3:17 PM  and received approval for admission today.   Admission Coordinator:  Michel Santee, PT, DPT time 3:17 PM Sudie Grumbling 01/11/20          Cosigned by: Izora Ribas, MD at 01/11/2020  3:30 PM

## 2020-01-12 ENCOUNTER — Inpatient Hospital Stay (HOSPITAL_COMMUNITY): Payer: 59 | Admitting: Physical Therapy

## 2020-01-12 ENCOUNTER — Inpatient Hospital Stay (HOSPITAL_COMMUNITY): Payer: 59

## 2020-01-12 ENCOUNTER — Inpatient Hospital Stay (HOSPITAL_COMMUNITY): Payer: 59 | Admitting: Occupational Therapy

## 2020-01-12 ENCOUNTER — Encounter (HOSPITAL_COMMUNITY): Payer: Self-pay | Admitting: Physical Medicine and Rehabilitation

## 2020-01-12 DIAGNOSIS — L4050 Arthropathic psoriasis, unspecified: Secondary | ICD-10-CM

## 2020-01-12 DIAGNOSIS — E119 Type 2 diabetes mellitus without complications: Secondary | ICD-10-CM

## 2020-01-12 DIAGNOSIS — S22088K Other fracture of T11-T12 vertebra, subsequent encounter for fracture with nonunion: Secondary | ICD-10-CM

## 2020-01-12 DIAGNOSIS — R569 Unspecified convulsions: Secondary | ICD-10-CM

## 2020-01-12 DIAGNOSIS — R140 Abdominal distension (gaseous): Secondary | ICD-10-CM

## 2020-01-12 DIAGNOSIS — S22080D Wedge compression fracture of T11-T12 vertebra, subsequent encounter for fracture with routine healing: Secondary | ICD-10-CM

## 2020-01-12 DIAGNOSIS — G6289 Other specified polyneuropathies: Secondary | ICD-10-CM

## 2020-01-12 LAB — COMPREHENSIVE METABOLIC PANEL
ALT: 18 U/L (ref 0–44)
AST: 18 U/L (ref 15–41)
Albumin: 2.8 g/dL — ABNORMAL LOW (ref 3.5–5.0)
Alkaline Phosphatase: 45 U/L (ref 38–126)
Anion gap: 8 (ref 5–15)
BUN: 11 mg/dL (ref 8–23)
CO2: 28 mmol/L (ref 22–32)
Calcium: 7.7 mg/dL — ABNORMAL LOW (ref 8.9–10.3)
Chloride: 99 mmol/L (ref 98–111)
Creatinine, Ser: 1 mg/dL (ref 0.61–1.24)
GFR calc Af Amer: >60 mL/min (ref >60)
GFR calc non Af Amer: >60 mL/min (ref >60)
Glucose, Bld: 180 mg/dL — ABNORMAL HIGH (ref 70–99)
Potassium: 3.9 mmol/L (ref 3.5–5.1)
Sodium: 135 mmol/L (ref 135–145)
Total Bilirubin: 1.2 mg/dL (ref 0.3–1.2)
Total Protein: 5.4 g/dL — ABNORMAL LOW (ref 6.5–8.1)

## 2020-01-12 LAB — BPAM RBC
Blood Product Expiration Date: 202103222359
Blood Product Expiration Date: 202103222359
ISSUE DATE / TIME: 202102262029
ISSUE DATE / TIME: 202102272247
Unit Type and Rh: 6200
Unit Type and Rh: 6200

## 2020-01-12 LAB — TYPE AND SCREEN
ABO/RH(D): A POS
Antibody Screen: NEGATIVE
Unit division: 0
Unit division: 0

## 2020-01-12 LAB — CBC WITH DIFFERENTIAL/PLATELET
Abs Immature Granulocytes: 0.02 10*3/uL (ref 0.00–0.07)
Basophils Absolute: 0 10*3/uL (ref 0.0–0.1)
Basophils Relative: 0 %
Eosinophils Absolute: 0.1 10*3/uL (ref 0.0–0.5)
Eosinophils Relative: 2 %
HCT: 32.7 % — ABNORMAL LOW (ref 39.0–52.0)
Hemoglobin: 11 g/dL — ABNORMAL LOW (ref 13.0–17.0)
Immature Granulocytes: 0 %
Lymphocytes Relative: 13 %
Lymphs Abs: 0.8 10*3/uL (ref 0.7–4.0)
MCH: 30.3 pg (ref 26.0–34.0)
MCHC: 33.6 g/dL (ref 30.0–36.0)
MCV: 90.1 fL (ref 80.0–100.0)
Monocytes Absolute: 0.7 10*3/uL (ref 0.1–1.0)
Monocytes Relative: 12 %
Neutro Abs: 4.3 10*3/uL (ref 1.7–7.7)
Neutrophils Relative %: 73 %
Platelets: 170 10*3/uL (ref 150–400)
RBC: 3.63 MIL/uL — ABNORMAL LOW (ref 4.22–5.81)
RDW: 12.9 % (ref 11.5–15.5)
WBC: 5.9 10*3/uL (ref 4.0–10.5)
nRBC: 0 % (ref 0.0–0.2)

## 2020-01-12 LAB — GLUCOSE, CAPILLARY
Glucose-Capillary: 132 mg/dL — ABNORMAL HIGH (ref 70–99)
Glucose-Capillary: 122 mg/dL — ABNORMAL HIGH (ref 70–99)
Glucose-Capillary: 153 mg/dL — ABNORMAL HIGH (ref 70–99)
Glucose-Capillary: 184 mg/dL — ABNORMAL HIGH (ref 70–99)

## 2020-01-12 LAB — HEMOGLOBIN A1C
Hgb A1c MFr Bld: 6.7 % — ABNORMAL HIGH (ref 4.8–5.6)
Mean Plasma Glucose: 145.59 mg/dL

## 2020-01-12 MED ORDER — OXYCODONE HCL 5 MG PO TABS
15.0000 mg | ORAL_TABLET | ORAL | Status: DC | PRN
  Administered 2020-01-12 – 2020-01-20 (×17): 15 mg via ORAL
  Filled 2020-01-12 (×18): qty 3

## 2020-01-12 MED ORDER — OXYCODONE HCL 5 MG PO TABS
5.0000 mg | ORAL_TABLET | ORAL | Status: DC | PRN
  Administered 2020-01-13 (×3): 10 mg via ORAL
  Administered 2020-01-13: 5 mg via ORAL
  Administered 2020-01-14: 10 mg via ORAL
  Administered 2020-01-19: 5 mg via ORAL
  Administered 2020-01-19: 10 mg via ORAL
  Administered 2020-01-21: 5 mg via ORAL
  Administered 2020-01-21 – 2020-01-22 (×4): 10 mg via ORAL
  Filled 2020-01-12: qty 2
  Filled 2020-01-12: qty 1
  Filled 2020-01-12 (×5): qty 2
  Filled 2020-01-12: qty 1
  Filled 2020-01-12 (×2): qty 2
  Filled 2020-01-12: qty 1
  Filled 2020-01-12: qty 2
  Filled 2020-01-12: qty 1
  Filled 2020-01-12 (×2): qty 2

## 2020-01-12 MED ORDER — OXYCODONE HCL ER 10 MG PO T12A
10.0000 mg | EXTENDED_RELEASE_TABLET | Freq: Two times a day (BID) | ORAL | Status: DC
  Administered 2020-01-12 – 2020-01-15 (×6): 10 mg via ORAL
  Filled 2020-01-12 (×6): qty 1

## 2020-01-12 NOTE — Plan of Care (Signed)
  Problem: Consults Goal: RH SPINAL CORD INJURY PATIENT EDUCATION Description:  See Patient Education module for education specifics.  Outcome: Progressing Goal: Skin Care Protocol Initiated - if Braden Score 18 or less Description: If consults are not indicated, leave blank or document N/A Outcome: Progressing Goal: Nutrition Consult-if indicated Outcome: Progressing Goal: Diabetes Guidelines if Diabetic/Glucose > 140 Description: If diabetic or lab glucose is > 140 mg/dl - Initiate Diabetes/Hyperglycemia Guidelines & Document Interventions  Outcome: Progressing   Problem: SCI BOWEL ELIMINATION Goal: RH STG MANAGE BOWEL WITH ASSISTANCE Description: STG Manage Bowel with mod I Assistance. Outcome: Progressing Goal: RH STG SCI MANAGE BOWEL WITH MEDICATION WITH ASSISTANCE Description: STG SCI Manage bowel with medication with mod I assistance. Outcome: Progressing Goal: RH STG MANAGE BOWEL W/EQUIPMENT W/ASSISTANCE Description: STG Manage Bowel With Equipment With Assistance Outcome: Progressing Goal: RH STG SCI MANAGE BOWEL PROGRAM W/ASSIST OR AS APPROPRIATE Description: STG SCI Manage bowel program w/assist or as appropriate. Outcome: Progressing Goal: RH OTHER STG BOWEL ELIMINATION GOALS W/ASSIST Description: Other STG Bowel Elimination Goals With Assistance. Outcome: Progressing   Problem: SCI BLADDER ELIMINATION Goal: RH STG MANAGE BLADDER WITH ASSISTANCE Description: STG Manage Bladder With mod I Assistance Outcome: Progressing Goal: RH STG MANAGE BLADDER WITH MEDICATION WITH ASSISTANCE Description: STG Manage Bladder With Medication With mod I Assistance. Outcome: Progressing Goal: RH STG MANAGE BLADDER WITH EQUIPMENT WITH ASSISTANCE Description: STG Manage Bladder With Equipment With Assistance Outcome: Progressing Goal: RH STG SCI MANAGE BLADDER PROGRAM W/ASSISTANCE Outcome: Progressing Goal: RH OTHER STG BLADDER ELIMINATION GOALS W/ASSIST Description: Other STG  Bladder Elimination Goals With Assistance Outcome: Progressing   Problem: RH SKIN INTEGRITY Goal: RH STG SKIN FREE OF INFECTION/BREAKDOWN Outcome: Progressing Goal: RH STG MAINTAIN SKIN INTEGRITY WITH ASSISTANCE Description: STG Maintain Skin Integrity With mod I Assistance. Outcome: Progressing Goal: RH STG ABLE TO PERFORM INCISION/WOUND CARE W/ASSISTANCE Description: STG Able To Perform Incision/Wound Care With Assistance. Outcome: Progressing   Problem: RH SAFETY Goal: RH STG ADHERE TO SAFETY PRECAUTIONS W/ASSISTANCE/DEVICE Description: STG Adhere to Safety Precautions With Assistance/Device. Outcome: Progressing Goal: RH STG DECREASED RISK OF FALL WITH ASSISTANCE Description: STG Decreased Risk of Fall With mod I Assistance. Outcome: Progressing   Problem: RH PAIN MANAGEMENT Goal: RH STG PAIN MANAGED AT OR BELOW PT'S PAIN GOAL Description: Pain scale less than 5/10 Outcome: Progressing   Problem: RH KNOWLEDGE DEFICIT SCI Goal: RH STG INCREASE KNOWLEDGE OF SELF CARE AFTER SCI Outcome: Progressing

## 2020-01-12 NOTE — Progress Notes (Signed)
[facility] PHYSICAL MEDICINE & REHABILITATION PROGRESS NOTE   Subjective/Complaints: Pt reports not doing well today and "survived" last night- pain has been very difficult to control- but per nursing, hasn't taken any prn pain meds since 5:30 last night- so went all night and this AM with no prns oxycodone.   Was asking if can increase pain meds- will do so, but also will discuss with pt need to TAKE meds to be effective.   Hadn't had BM since 2/25- had enema this AM and had 1 large BM this AM. Getting ready to get into shower with PT when entered room.    ROS: Pt denies SOB, CP, abd pain, N/V/C/D  Objective:   DG Abd 1 View  Result Date: 01/11/2020 CLINICAL DATA:  Abdominal distension EXAM: ABDOMEN - 1 VIEW COMPARISON:  CT abdomen pelvis 10/03/2016 FINDINGS: Mild nonspecific air distention of the colon without high-grade obstructive bowel gas pattern within the small bowel. No suspicious calcifications. Prior thoracolumbar fusion is noted as well as postprocedural changes from prior lower lumbar vertebroplasty. Remaining osseous structures are unremarkable. IMPRESSION: Mild nonspecific air distention of the colon without high-grade obstructive bowel gas pattern within the small bowel. Electronically Signed   By: Lovena Le M.D.   On: 01/11/2020 21:06   No results for input(s): WBC, HGB, HCT, PLT in the last 72 hours. Recent Labs    01/10/20 0509  NA 136  K 3.9  CL 103  CO2 26  GLUCOSE 172*  BUN 13  CREATININE 1.03  CALCIUM 8.0*    Intake/Output Summary (Last 24 hours) at 01/12/2020 1026 Last data filed at 01/12/2020 0848 Gross per 24 hour  Intake 100 ml  Output --  Net 100 ml     Physical Exam: Vital Signs Blood pressure (!) 103/49, pulse 79, temperature 98.9 F (37.2 C), resp. rate 18, height 6' (1.829 m), weight 104.3 kg, SpO2 93 %.  Physical Exam Reviewed nursing and vitals and labs General:awake, alert, sitting EOB, appears in pain; rocking back and forth, no  Acute distress, OT at bedside covering back incision.  HEENT:conjugate gaze Neck:supple Heart:RRR Chest: CTA B/L Abdomen:soft, less distended, NT, (+) hyperactive BS Extremities:No clubbing, cyanosis, or edema. Pulses are 2+ Skin:Psoriatic patches on bilateral knees. Spinal incision has a lot of dried blood around it and drainage serosanguinous on bed from incision; no erythema; no active bleeding.  Neuro:alert Fatigued.Motor function is grossly4-/5 as patient is limited by fatigue, nausea, and pain. Musculoskeletal:Full ROM, No pain with AROM or PROM in the neck, trunk, or extremities. Posture appropriate Psych:pt irritable due to pain.     Assessment/Plan: 1. Functional deficits secondary to Vertebral thoracic fx s/p T9-L2 fusion which require 3+ hours per day of interdisciplinary therapy in a comprehensive inpatient rehab setting.  Physiatrist is providing close team supervision and 24 hour management of active medical problems listed below.  Physiatrist and rehab team continue to assess barriers to discharge/monitor patient progress toward functional and medical goals  Care Tool:  Bathing              Bathing assist       Upper Body Dressing/Undressing Upper body dressing   What is the patient wearing?: Hospital gown only    Upper body assist      Lower Body Dressing/Undressing Lower body dressing      What is the patient wearing?: Underwear/pull up     Lower body assist Assist for lower body dressing: Maximal Assistance - Patient 25 - 49%  Toileting Toileting    Toileting assist Assist for toileting: Moderate Assistance - Patient 50 - 74%     Transfers Chair/bed transfer  Transfers assist     Chair/bed transfer assist level: Moderate Assistance - Patient 50 - 74%     Locomotion Ambulation   Ambulation assist              Walk 10 feet activity   Assist           Walk 50 feet activity   Assist            Walk 150 feet activity   Assist           Walk 10 feet on uneven surface  activity   Assist           Wheelchair     Assist               Wheelchair 50 feet with 2 turns activity    Assist            Wheelchair 150 feet activity     Assist          Blood pressure (!) 103/49, pulse 79, temperature 98.9 F (37.2 C), resp. rate 18, height 6' (1.829 m), weight 104.3 kg, SpO2 93 %.  Medical Problem List and Plan: 1.  Impaired mobility and ADLs s/p T9-L2 posterior spinal instrumented fusion following fall due to seizure             -patient may shower but incision site must be covered             -ELOS/Goals: 9-11 days modI in PT and OT and I in SLP             -Use thoracolumbosacral orthotic when ambulating.             Spinal precautions.  2.  Antithrombotics: -DVT/anticoagulation:  Pharmaceutical: Lovenox             -antiplatelet therapy: N/A 3. Pain Management: Now on OxyContin 10 mg bid with oxycodone prn. Consider addition of Gabapentin 335mf TID for neuropathic pain.   3/2- will increase Oxy IR to 5-15 mg q4 hours for now- and monitor- not taking prns often.  4. Mood: LCSW to follow for evaluation and support. Has history of depression.              -antipsychotic agents: N/a 5. Neuropsych: This patient is capable of making decisions on his own behalf. 6. Skin/Wound Care: Monitor wound for healing.  7. Fluids/Electrolytes/Nutrition: Monitor I/O. Check lytes in am.  8. Seizures: History of nocturnal seizures. Breakthrough seizures---patient unable to recall home dose. Now on Keppra 1000 mg bid. 9. Ileus? : Abdominal distension with minimal bowel sounds. He has not had a BM since admission. Will order KUB for work up. Start miralax bid and order enema for today  3/2- had large BM after enema today- will con't to monitor BMs closely so doesn't get impacted 10. Leucocytosis: Low graded fevers noted--will check UA/UCS. Monitor for signs  of infection.  3/2- U/A negative for UTI- labs still pending- drawn late today.  11. Hyperglycemia: Elevated fasting BS--will order ac/hs cbg X 2-3 days and check Hgb A1C.   3/2- A1c is 6.7- so pt meets criteria for DM type 2- will d/w pt possible treatments- also needs to do CBGs, which has been declining intermittently. .  12.  Chronic insomnia: Due to anxiety? Has declined sleep study. Will add xanax  prn  13. Peripheral axonal neuropathy: Numbness bilateral feet and hands as well as LLE.      LOS: 1 days A FACE TO FACE EVALUATION WAS PERFORMED  Zakkiyya Barno 01/12/2020, 10:26 AM

## 2020-01-12 NOTE — Progress Notes (Signed)
Occupational Therapy Session Note  Patient Details  Name: Kylon Kumpf MRN: [id number] Date of Birth: [birth year]  Today's Date: 01/12/2020 OT Individual Time: 1130-1155 OT Individual Time Calculation (min): 25 min    Short Term Goals: Week 1:  OT Short Term Goal 1 (Week 1): Pt with complete bathing with use of AE PRN Min assist OT Short Term Goal 2 (Week 1): Pt will complete toilet transfers Supervision with LRAD OT Short Term Goal 3 (Week 1): Pt will complete toileting tasks with Min assist OT Short Term Goal 4 (Week 1): Pt will maintain standing for 2 grooming tasks to demonstrate increased activity tolerance  Skilled Therapeutic Interventions/Progress Updates:  Pt resting in bed upon arrival.  Pt stated he just awakened and pain in his back was "pretty bad." Pt had difficulty keeping eyes open when engaged.  Discussed role of OT in rehab and LTGs with estimated LOS. Introduced AE and use to assist with BADLs. Pt with difficulty focusing 2/2 pain.  Pt remained in bed with all needs within reach and bed alarm activated.   Therapy Documentation Precautions:  Precautions Precautions: Back Required Braces or Orthoses: Spinal Brace Spinal Brace: Thoracolumbosacral orthotic(when ambulating) Restrictions Weight Bearing Restrictions: No  Pain: Pain Assessment Pain Scale: 0-10 Pain Score: 7 Pain Type: Acute pain;Surgical pain Pain Location: Back Pain Orientation: Medial Pain Descriptors / Indicators: Aching Pain Intervention(s): repositioned and emotional support  Therapy/Group: Individual Therapy  [name] 01/12/2020, 1:54 PM

## 2020-01-12 NOTE — Evaluation (Signed)
Physical Therapy Assessment and Plan  Patient Details  Name: Benjamin [name] MRN: [id number] Date of Birth: 1951/05/27  PT Diagnosis: Abnormal posture, Abnormality of gait, Low back pain and Pain in mid to low back Rehab Potential: Good ELOS: 10-14 days   Today's Date: 01/12/2020 PT Individual Time: 1300-1400 PT Individual Time Calculation (min): 60 min    Problem List:  Patient Active Problem List   Diagnosis Date Noted  . Diabetes ([place]) 01/12/2020  . Abdominal distension   . Thoracic vertebral fracture ([place]) 01/11/2020  . Closed T11 spinal fracture ([place])   . Psychophysiological insomnia 08/18/2018  . Psoriatic arthritis ([place]) 08/18/2018  . Partial symptomatic epilepsy with complex partial seizures, not intractable, without status epilepticus ([place]) 08/18/2018  . Subjective memory complaints 08/15/2017  . Seizures ([place]) 08/15/2017  . Peripheral axonal neuropathy 08/06/2014  . Unspecified hereditary and idiopathic peripheral neuropathy 07/07/2014  . Lumbar stenosis with neurogenic claudication 12/23/2013  . Other forms of epilepsy and recurrent seizures without mention of intractable epilepsy 09/16/2013  . Prostate cancer ([place]) 07/03/2012    Past Medical History:  Past Medical History:  Diagnosis Date  . Acute prostatitis 07/03/12   s/p prostate   . Allergy   . Anxiety   . Arthritis    stenosis lower back  . Back pain   . BPH (benign prostatic hypertrophy) with urinary obstruction   . Depression   . ED (erectile dysfunction)   . Headache(784.0)    "sinus" headaches  . History of ITP    as a child per patient  . Hypercholesterolemia   . Other forms of epilepsy and recurrent seizures without mention of intractable epilepsy 09/16/2013  . Peripheral axonal neuropathy 08/06/2014  . Prostate cancer ([place]) 07/03/12   Adenocarcinoma,gleason=3+3=6,PSA=5.0,volume=20cc  . Seizures ([place])   . Unspecified hereditary and idiopathic peripheral neuropathy 07/07/2014   Foot numbness  starting 2010, hands beginning in 2014- 15 .   Past Surgical History:  Past Surgical History:  Procedure Laterality Date  . BACK SURGERY    . COLONOSCOPY    . KNEE ARTHROSCOPY  2017  . LUMBAR LAMINECTOMY/DECOMPRESSION MICRODISCECTOMY N/A 12/23/2013   Procedure: Lumbar Laminectomy Decompression, Lumbar Two, Three, Four, Five;  Surgeon: Hosie Spangle, MD;  Location: [facility];  Service: Neurosurgery;  Laterality: N/A;  . LYMPHADENECTOMY Bilateral 05/25/2016   Procedure: BILATERAL PELVIC LYMPHADENECTOMY;  Surgeon: Alexis Frock, MD;  Location: [facility];  Service: Urology;  Laterality: Bilateral;  . ROBOT ASSISTED LAPAROSCOPIC RADICAL PROSTATECTOMY N/A 05/25/2016   Procedure: XI ROBOTIC ASSISTED LAPAROSCOPIC RADICAL PROSTATECTOMY WITH INDOCYANINE GREEN DYE;  Surgeon: Alexis Frock, MD;  Location: [facility];  Service: Urology;  Laterality: N/A;  . TOE SURGERY      Assessment & Plan Clinical Impression:  Benjamin [name] is a 69 year old male with history of BPH, DISH,  lumbar stenosis with peripheral neuropathy, psoriasis, seizure disorder who was admitted on 01/08/20 after a fall off a ladder due to seizures.  He was loaded with Keppra and dose increased to 1000 mg bid. He ws found to have unstable oblique coronal T11 vertebral body Fx with acute fractures on L1, L2 and L3 as well as L5-S1 left foraminal stenosis. He was taken to OR emergently for T9- L2 posterior fusion by Dr. Zada Finders and post op cleared to start Lovenox. To wear TLSO prn for comfort.  Oxycontin was added on 03/01 for more consistent pain control. Therapy evaluations showed decline in standing balace with pain affecting mobility and ADLs. CIR recommended due  to functional decline.  Patient transferred to CIR on 01/11/2020 .   Patient currently requires mod with mobility secondary to muscle weakness, decreased cardiorespiratoy endurance and decreased sitting balance, decreased standing balance, decreased postural control and decreased  balance strategies.  Prior to hospitalization, patient was independent  with mobility and lived with Spouse, Family in a House home.  Home access is 4Stairs to enter.  Patient will benefit from skilled PT intervention to maximize safe functional mobility, minimize fall risk and decrease caregiver burden for planned discharge home with intermittent assist.  Anticipate patient will benefit from follow up [facility] at discharge.  PT - End of Session Activity Tolerance: Tolerates 30+ min activity with multiple rests Endurance Deficit: Yes Endurance Deficit Description: frequent rest breaks due to back pain PT Assessment Rehab Potential (ACUTE/IP ONLY): Good PT Barriers to Discharge: Decreased caregiver support;Medical stability;Home environment access/layout PT Patient demonstrates impairments in the following area(s): Balance;Endurance;Pain;Safety PT Transfers Functional Problem(s): Bed Mobility;Bed to Chair;Car;Furniture;Floor PT Locomotion Functional Problem(s): Ambulation;Wheelchair Mobility;Stairs PT Plan PT Intensity: Minimum of 1-2 x/day ,45 to 90 minutes PT Frequency: 5 out of 7 days PT Duration Estimated Length of Stay: 10-14 days PT Treatment/Interventions: Ambulation/gait training;Balance/vestibular training;Community reintegration;Discharge planning;Disease management/prevention;DME/adaptive equipment instruction;Functional mobility training;Neuromuscular re-education;Pain management;Patient/family education;Stair training;Therapeutic Activities;Therapeutic Exercise;UE/LE Strength taining/ROM;UE/LE Coordination activities;Wheelchair propulsion/positioning PT Transfers Anticipated Outcome(s): mod I PT Locomotion Anticipated Outcome(s): mod I with LRAD PT Recommendation Recommendations for Other Services: Therapeutic Recreation consult Therapeutic Recreation Interventions: Stress management Follow Up Recommendations: Home health PT Patient destination: Home Equipment Recommended: To be  determined Equipment Details: TBD pending progress  Skilled Therapeutic Intervention Evaluation completed (see details above and below) with education on PT POC and goals and individual treatment initiated with focus on functional transfer assessment, orientation to rehab unit and schedule, discussion of LOS and estimated goal level, etc. Pt received supine in bed, agreeable to PT session. Pt reports 5/10 pain in mid-low back at rest, increases to 7/10 with mobility. Pt reports he is premedicated prior to start of therapy session. Supine to sit with min A with increased time needed and cues for log-rolling. Pt is max A to don TLSO while seated EOB. Sit to stand with mod A to RW. Stand pivot transfer bed to w/c with RW and min A. Ambulation x 70 ft with RW and min A for balance. Pt ambulates with decreased gait speed, flexed trunk and flexed knee posture. Car transfer with mod A for sit to stand to RW and some assist for BLE management in/out. Pt agreeable to stay seated up in w/c at end of session.  PT Evaluation Precautions/Restrictions Precautions Precautions: Back Precaution Comments: BLT percautions  Required Braces or Orthoses: Spinal Brace Spinal Brace: Thoracolumbosacral orthotic(when ambulating) Restrictions Weight Bearing Restrictions: No Home Living/Prior Functioning Home Living Available Help at Discharge: Family;Available PRN/intermittently Type of Home: House Home Access: Stairs to enter [employer] of Steps: 4 Entrance Stairs-Rails: Right;Left Home Layout: Two level;Able to live on main level with bedroom/bathroom Alternate Level Stairs-Number of Steps: 12 Additional Comments: Does not need to get upstairs, bedroom/bathroom on the first floor  Lives With: Spouse;Family Prior Function Level of Independence: Independent with gait;Independent with transfers  Able to Take Stairs?: Yes Driving: Yes Vocation: Retired Comments: Retired Tour manager; very active  enjoyed exercise bike Vision/Perception  Vision - History Baseline Vision: Wears glasses all the time Perception Perception: Within Functional Limits Praxis Praxis: Intact  Cognition Overall Cognitive Status: Within Functional Limits for tasks assessed Arousal/Alertness: Awake/alert Orientation Level: Oriented X4  Attention: Selective Selective Attention: Appears intact Memory: Appears intact Awareness: Appears intact Problem Solving: Impaired Safety/Judgment: Appears intact Sensation Sensation Light Touch: Appears Intact(reports N/T in BLE prior to this injury) Proprioception: Appears Intact Coordination Gross Motor Movements are Fluid and Coordinated: No Fine Motor Movements are Fluid and Coordinated: No Coordination and Movement Description: impaired 2/2 pain Motor  Motor Motor: Abnormal postural alignment and control Motor - Skilled Clinical Observations: impaired 2/2 pain  Mobility Bed Mobility Bed Mobility: Rolling Right;Rolling Left;Supine to Sit;Sit to Supine Rolling Right: Minimal Assistance - Patient > 75% Rolling Left: Minimal Assistance - Patient > 75% Supine to Sit: Minimal Assistance - Patient > 75% Sit to Supine: Minimal Assistance - Patient > 75% Transfers Transfers: Sit to Stand;Stand to Sit Sit to Stand: Moderate Assistance - Patient 50-74% Stand to Sit: Moderate Assistance - Patient 50-74% Transfer (Assistive device): Rolling walker Locomotion  Gait Gait Distance (Feet): 70 Feet Assistive device: Rolling walker Gait Gait Pattern: Impaired(flexed trunk, flexed knees) Gait velocity: decreased Stairs / Additional Locomotion Stairs: Yes Stairs Assistance: Minimal Assistance - Patient > 75% Stair Management Technique: Two rails;Step to pattern Number of Stairs: 4 Height of Stairs: 6 Wheelchair Mobility Wheelchair Mobility: No  Trunk/Postural Assessment  Cervical Assessment Cervical Assessment: Exceptions to WFL(forward head) Thoracic  Assessment Thoracic Assessment: Exceptions to WFL(spinal precautions; rounded shoulders) Lumbar Assessment Lumbar Assessment: Exceptions to WFL(spinal precautions; posterior pelvic tilt) Postural Control Postural Control: Deficits on evaluation(delayed)  Balance Balance Balance Assessed: Yes Static Sitting Balance Static Sitting - Balance Support: No upper extremity supported;Feet supported Static Sitting - Level of Assistance: 5: Stand by assistance Dynamic Sitting Balance Dynamic Sitting - Balance Support: No upper extremity supported;Feet supported;During functional activity Dynamic Sitting - Level of Assistance: 4: Min Insurance risk surveyor Standing - Balance Support: Bilateral upper extremity supported;During functional activity Static Standing - Level of Assistance: 4: Min assist Dynamic Standing Balance Dynamic Standing - Balance Support: Bilateral upper extremity supported;During functional activity Dynamic Standing - Level of Assistance: 4: Min assist Extremity Assessment   RLE Assessment RLE Assessment: Within Functional Limits General Strength Comments: 4/5 grossly LLE Assessment LLE Assessment: Within Functional Limits General Strength Comments: 4+/5 grossly    Refer to Care Plan for Long Term Goals  Recommendations for other services: Therapeutic Recreation  Stress management  Discharge Criteria: Patient will be discharged from PT if patient refuses treatment 3 consecutive times without medical reason, if treatment goals not met, if there is a change in medical status, if patient makes no progress towards goals or if patient is discharged from hospital.  The above assessment, treatment plan, treatment alternatives and goals were discussed and mutually agreed upon: by patient   Excell Seltzer, PT, DPT 01/12/2020, 2:42 PM

## 2020-01-12 NOTE — Evaluation (Signed)
Occupational Therapy Assessment and Plan  Patient Details  Name: Benjamin [name] MRN: [id number] Date of Birth: 04-15-1951  OT Diagnosis: abnormal posture, acute pain, lumbago (low back pain), muscle weakness (generalized) and pain in joint Rehab Potential: Rehab Potential (ACUTE ONLY): Good ELOS: 10-14 days   Today's Date: 01/12/2020 OT Individual Time: 3875-6433 OT Individual Time Calculation (min): 74 min     Problem List:  Patient Active Problem List   Diagnosis Date Noted  . Diabetes ([place]) 01/12/2020  . Abdominal distension   . Thoracic vertebral fracture ([place]) 01/11/2020  . Closed T11 spinal fracture ([place])   . Psychophysiological insomnia 08/18/2018  . Psoriatic arthritis ([place]) 08/18/2018  . Partial symptomatic epilepsy with complex partial seizures, not intractable, without status epilepticus ([place]) 08/18/2018  . Subjective memory complaints 08/15/2017  . Seizures ([place]) 08/15/2017  . Peripheral axonal neuropathy 08/06/2014  . Unspecified hereditary and idiopathic peripheral neuropathy 07/07/2014  . Lumbar stenosis with neurogenic claudication 12/23/2013  . Other forms of epilepsy and recurrent seizures without mention of intractable epilepsy 09/16/2013  . Prostate cancer ([place]) 07/03/2012    Past Medical History:  Past Medical History:  Diagnosis Date  . Acute prostatitis 07/03/12   s/p prostate   . Allergy   . Anxiety   . Arthritis    stenosis lower back  . Back pain   . BPH (benign prostatic hypertrophy) with urinary obstruction   . Depression   . ED (erectile dysfunction)   . Headache(784.0)    "sinus" headaches  . History of ITP    as a child per patient  . Hypercholesterolemia   . Other forms of epilepsy and recurrent seizures without mention of intractable epilepsy 09/16/2013  . Peripheral axonal neuropathy 08/06/2014  . Prostate cancer ([place]) 07/03/12   Adenocarcinoma,gleason=3+3=6,PSA=5.0,volume=20cc  . Seizures ([place])   . Unspecified hereditary and idiopathic  peripheral neuropathy 07/07/2014   Foot numbness starting 2010, hands beginning in 2014- 15 .   Past Surgical History:  Past Surgical History:  Procedure Laterality Date  . BACK SURGERY    . COLONOSCOPY    . KNEE ARTHROSCOPY  2017  . LUMBAR LAMINECTOMY/DECOMPRESSION MICRODISCECTOMY N/A 12/23/2013   Procedure: Lumbar Laminectomy Decompression, Lumbar Two, Three, Four, Five;  Surgeon: Hosie Spangle, MD;  Location: [facility];  Service: Neurosurgery;  Laterality: N/A;  . LYMPHADENECTOMY Bilateral 05/25/2016   Procedure: BILATERAL PELVIC LYMPHADENECTOMY;  Surgeon: Alexis Frock, MD;  Location: [facility];  Service: Urology;  Laterality: Bilateral;  . ROBOT ASSISTED LAPAROSCOPIC RADICAL PROSTATECTOMY N/A 05/25/2016   Procedure: XI ROBOTIC ASSISTED LAPAROSCOPIC RADICAL PROSTATECTOMY WITH INDOCYANINE GREEN DYE;  Surgeon: Alexis Frock, MD;  Location: [facility];  Service: Urology;  Laterality: N/A;  . TOE SURGERY      Assessment & Plan Clinical Impression: Patient is a 69 y.o. year old male with history of BPH, DISH,  lumbar stenosis with peripheral neuropathy, psoriasis, seizure disorder who was admitted on 01/08/20 after a fall off a ladder due to seizures.  He was loaded with Keppra and dose increased to 1000 mg bid. He ws found to have unstable oblique coronal T11 vertebral body Fx with acute fractures on L1, L2 and L3 as well as L5-S1 left foraminal stenosis. He was taken to OR emergently for T9- L2 posterior fusion by Dr. Zada Finders and post op cleared to start Lovenox. To wear TLSO prn for comfort.  Oxycontin was added on 03/01 for more consistent pain control. Therapy evaluations showed decline in standing balace with pain affecting mobility and  ADLs. CIR recommended due to functional decline.  Patient transferred to CIR on 01/11/2020 .    Patient currently requires max with basic self-care skills secondary to muscle weakness and muscle joint tightness, decreased cardiorespiratoy endurance and  decreased standing balance, difficulty maintaining precautions and pain.  Prior to hospitalization, patient could complete ADLs with independent .  Patient will benefit from skilled intervention to decrease level of assist with basic self-care skills prior to discharge home with care partner.  Anticipate patient will require intermittent supervision and follow up home health.  OT - End of Session Activity Tolerance: Tolerates 30+ min activity with multiple rests Endurance Deficit: Yes Endurance Deficit Description: frequent rest breaks due to back pain OT Assessment Rehab Potential (ACUTE ONLY): Good OT Patient demonstrates impairments in the following area(s): Balance;Endurance;Motor;Pain;Safety OT Basic ADL's Functional Problem(s): Grooming;Bathing;Dressing;Toileting OT Advanced ADL's Functional Problem(s): Laundry OT Transfers Functional Problem(s): Toilet;Tub/Shower OT Additional Impairment(s): None OT Plan OT Intensity: Minimum of 1-2 x/day, 45 to 90 minutes OT Duration/Estimated Length of Stay: 10-14 days OT Treatment/Interventions: Medical illustrator training;Community reintegration;Discharge planning;Disease mangement/prevention;DME/adaptive equipment instruction;Functional mobility training;Neuromuscular re-education;Pain management;Patient/family education;Psychosocial support;Self Care/advanced ADL retraining;Skin care/wound managment;Therapeutic Activities;Therapeutic Exercise;UE/LE Strength taining/ROM;UE/LE Coordination activities OT Basic Self-Care Anticipated Outcome(s): Mod i OT Toileting Anticipated Outcome(s): Mod I OT Bathroom Transfers Anticipated Outcome(s): Mod I toilet, Supervision shower OT Recommendation Patient destination: Home Follow Up Recommendations: Home health OT Equipment Recommended: Tub/shower bench;3 in 1 bedside comode   Skilled Therapeutic Intervention OT eval completed with discussion of rehab process, OT purpose, POC, ELOS, and goals.  ADL  assessment completed with focus on sit > stand, functional transfers, and activity tolerance during self-care tasks.  Pt expressing desire to shower, therefore covered incision prior to shower.  Pt required mod assist for log rolling technique, sidelying to sitting, and sit > stand from lower surface.  Donned TLSO in standing prior to ambulating in room.  Completed toilet transfer with Mod assist to [facility] over toilet for increased height.  Pt required assistance with controlled descent to sit on [facility] and shower seat.  Pt completed UB bathing with min assist and max- total assist for LB dressing.  Total assist for LB dressing at sit > stand from EOB.  Pt with increased pain as session continued, requiring return to sidelying for pain relief.  Pt requires frequent rest breaks and encouragement, will benefit from scheduled pain meds if not already.   OT Evaluation Precautions/Restrictions  Precautions Precautions: Back Required Braces or Orthoses: Spinal Brace Spinal Brace: Thoracolumbosacral orthotic(when ambulating)  Pain Pain Assessment Pain Scale: 0-10 Pain Score: 8  Pain Type: Acute pain;Surgical pain Pain Location: Back Pain Orientation: Medial Pain Descriptors / Indicators: Aching Pain Intervention(s): Medication (See eMAR) Home Living/Prior Functioning Home Living Family/patient expects to be discharged to:: Private residence Living Arrangements: Spouse/significant other Available Help at Discharge: Family, Available 24 hours/day Type of Home: House Home Access: Stairs to enter Technical brewer of Steps: 4 Entrance Stairs-Rails: Right, Left Home Layout: Two level, Able to live on main level with bedroom/bathroom Alternate Level Stairs-Number of Steps: 12 Bathroom Shower/Tub: Tub/shower unit, Architectural technologist: Handicapped height Additional Comments: Does not need to get upstairs, bedroom/bathroom on the first floor  Lives With: Spouse, Family IADL History Homemaking  Responsibilities: Yes Laundry Responsibility: Primary Current License: Yes Prior Function Level of Independence: Independent with basic ADLs, Independent with homemaking with ambulation, Independent with gait  Able to Take Stairs?: Yes Driving: Yes Vocation: Retired Comments: Patient was very active. he reports the day before  his fall he went 10 miles on an exercise bike ADL ADL Upper Body Bathing: Minimal assistance Where Assessed-Upper Body Bathing: Shower Lower Body Bathing: Maximal assistance Where Assessed-Lower Body Bathing: Shower Upper Body Dressing: Minimal assistance Where Assessed-Upper Body Dressing: Edge of bed Lower Body Dressing: Dependent Where Assessed-Lower Body Dressing: Edge of bed Toileting: Maximal assistance Where Assessed-Toileting: Toilet(BSC over toilet) Toilet Transfer: Minimal assistance, Moderate assistance(Min ambulating, Mod to stand from toilet surface) Toilet Transfer Method: Insurance claims handler Equipment: Bedside commode(over toilet) Social research officer, government: Moderate assistance Social research officer, government Method: Print production planner with back, Grab bars Vision Baseline Vision/History: Wears glasses Wears Glasses: At all times Patient Visual Report: No change from baseline Vision Assessment?: No apparent visual deficits Perception  Perception: Within Functional Limits Cognition Overall Cognitive Status: Within Functional Limits for tasks assessed Orientation Level: Person Year: 2021 Month: March Day of Week: Incorrect(Wednesday) Memory: Appears intact Immediate Memory Recall: Sock;Blue;Bed Memory Recall Sock: Without Cue Memory Recall Blue: Without Cue Memory Recall Bed: Not able to recall Attention: Selective Selective Attention: Appears intact Awareness: Appears intact Problem Solving: Impaired Safety/Judgment: Appears intact Sensation Sensation Light Touch: Appears Intact Proprioception: Appears  Intact Coordination Gross Motor Movements are Fluid and Coordinated: No Fine Motor Movements are Fluid and Coordinated: No Coordination and Movement Description: pain impacting assessment Mobility  Bed Mobility Bed Mobility: Rolling Right;Rolling Left;Left Sidelying to Sit Rolling Right: Minimal Assistance - Patient > 75% Rolling Left: Minimal Assistance - Patient > 75% Left Sidelying to Sit: Moderate Assistance - Patient 50-74% Transfers Sit to Stand: Moderate Assistance - Patient 50-74% Stand to Sit: Moderate Assistance - Patient 50-74%  Extremity/Trunk Assessment RUE Assessment RUE Assessment: Within Functional Limits General Strength Comments: 4-/5 overall, limited by pain LUE Assessment LUE Assessment: Within Functional Limits General Strength Comments: 4-/5 overall, limited by pain     Refer to Care Plan for Long Term Goals  Recommendations for other services: None    Discharge Criteria: Patient will be discharged from OT if patient refuses treatment 3 consecutive times without medical reason, if treatment goals not met, if there is a change in medical status, if patient makes no progress towards goals or if patient is discharged from hospital.  The above assessment, treatment plan, treatment alternatives and goals were discussed and mutually agreed upon: by patient  [place] Dense [facility] 01/12/2020, 10:41 AM

## 2020-01-12 NOTE — Patient Care Conference (Signed)
Inpatient RehabilitationTeam Conference and Plan of Care Update Date: 01/12/2020   Time: 11:20 AM    Patient Name: Benjamin [name]      Medical Record Number: [id number]  Date of Birth: 1951/02/25 Sex: Male         Room/Bed: 4W09C/4W09C-01 Payor Info: Payor: [employer] / Plan: Electrical engineer / Product Type: *No Product type* /    Admit Date/Time:  01/11/2020  5:46 PM  Primary Diagnosis:  Thoracic vertebral fracture [facility])  Patient Active Problem List   Diagnosis Date Noted  . Diabetes ([place]) 01/12/2020  . Abdominal distension   . Thoracic vertebral fracture ([place]) 01/11/2020  . Closed T11 spinal fracture ([place])   . Psychophysiological insomnia 08/18/2018  . Psoriatic arthritis ([place]) 08/18/2018  . Partial symptomatic epilepsy with complex partial seizures, not intractable, without status epilepticus ([place]) 08/18/2018  . Subjective memory complaints 08/15/2017  . Seizures ([place]) 08/15/2017  . Peripheral axonal neuropathy 08/06/2014  . Unspecified hereditary and idiopathic peripheral neuropathy 07/07/2014  . Lumbar stenosis with neurogenic claudication 12/23/2013  . Other forms of epilepsy and recurrent seizures without mention of intractable epilepsy 09/16/2013  . Prostate cancer ([place]) 07/03/2012    Expected Discharge Date: Expected Discharge Date: (TBD)  Team Members Present: Physician leading conference: Dr. Courtney Heys Care Coodinator Present: Loralee Pacas, LCSWA;Genie Masayo Fera, RN, MSN Nurse Present: Artis Flock, LPN PT Present: Excell Seltzer, PT OT Present: Willeen Cass, OT;Roanna Epley, COTA PPS Coordinator present : Gunnar Fusi, Novella Olive, PT     Current Status/Progress Goal Weekly Team Focus  Bowel/Bladder   pt is continent of b/b, LBM 01/07/20. Pt c/o excessive gas, PRN medication given. Pt agreed to Miralax.  Pt will remain continent of b/b, with normal bowel pattern  PRN medications given to help regulate BM, Q2h toileting.   Swallow/Nutrition/ Hydration              ADL's   Mod A bed mobility and sit > stand, Min-mod A transfers with RW, Max assist bathing, Min A UB dressing, Total assist LB dressing - limited by pain  Mod I overall, Supervision shower transfer  ADL retraining, pain management, activity tolerance, AE education   Mobility   PT eval pending  PT eval pending  PT eval pending   Communication             Safety/Cognition/ Behavioral Observations            Pain   Pt c/o of 8/10 back pain. PRN oxy and robax given. Scheduled Oxycotin 10 mg.  pain will be <4  Assess pain qshift/ PRN   Skin                *See Care Plan and progress notes for long and short-term goals.     Barriers to Discharge  Current Status/Progress Possible Resolutions Date Resolved   Nursing                  PT  Decreased caregiver support;Medical stability;Home environment access/layout                 OT                  SLP                SW                Discharge Planning/Teaching Needs: Pt wife reports she does not work far from home and will check in on pt periodically  during the day. She also states their adult dtr lives in the home. States there are also neighbors that can help as well. Will finalize the best plan of care. Family ed to be arranged per therapy recommendations.          Team Discussion: Seizure up on a ladder, T9-L2 fusion, BM today, low grade temp, UA neg, labs ordered, has DM, Hgb A1C 6.7, will need to treat DM.  RN oxy given, cont B/B, BS 132, refusing insulin.  PT eval pending.  OT tot LB B/D, sit to stand mod A, goals S/mod I.   Revisions to Treatment Plan: N/A     Medical Summary Current Status: pain improved with pain meds; LBM this AM- continent- BG was 132- he refused 1 unit insulin. Weekly Focus/Goal: OT- total assist LB bathing/dressing; set goals of supervision/mod I. pain was really limiting.  Barriers to Discharge: Behavior;Decreased family/caregiver support;Home enviroment access/layout;Medical  stability;Medication compliance;Weight;Weight bearing restrictions;Other (comments)  Barriers to Discharge Comments: TLSO Possible Resolutions to Barriers: PT eval this afternoon   Continued Need for Acute Rehabilitation Level of Care: The patient requires daily medical management by a physician with specialized training in physical medicine and rehabilitation for the following reasons: Direction of a multidisciplinary physical rehabilitation program to maximize functional independence : Yes Medical management of patient stability for increased activity during participation in an intensive rehabilitation regime.: Yes Analysis of laboratory values and/or radiology reports with any subsequent need for medication adjustment and/or medical intervention. : Yes   I attest that I was present, lead the team conference, and concur with the assessment and plan of the team.   Retta Diones 01/15/2020, 9:32 PM   Team conference was held via web/ teleconference due to [place] - 19

## 2020-01-12 NOTE — Progress Notes (Signed)
Physical Therapy Session Note  Patient Details  Name: Benjamin [name] MRN: [id number] Date of Birth: 1950-12-16  Today's Date: 01/12/2020 PT Individual Time: 1415-1440 PT Individual Time Calculation (min): 25 min   Short Term Goals: Week 1:  PT Short Term Goal 1 (Week 1): Pt will complete transfers at Supervision level consistently PT Short Term Goal 2 (Week 1): Pt will ambulate x 150 ft with min A and LRAD PT Short Term Goal 3 (Week 1): Pt will navigate 4 stairs with one handrail and min A  Skilled Therapeutic Interventions/Progress Updates:  Pt received seated in w/c in room, agreeable to PT session. Pt reports 5/10 pain at rest, increases to 7/10 with mobility but decreases again at rest. Dependent transport via w/c to therapy gym for time conservation. Ascend/descend 4 x 6" stairs with 2 handrails and min A, step-to gait pattern. Pt then reports feeling fatigued and requests to return to room. Stand pivot transfer w/c to bed with RW and min to mod A. Sit to supine mod A for BLE management. Pt left semi-reclined in bed with needs in reach at end of session.  Therapy Documentation Precautions:  Precautions Precautions: Back Precaution Comments: BLT percautions  Required Braces or Orthoses: Spinal Brace Spinal Brace: Thoracolumbosacral orthotic(when ambulating) Restrictions Weight Bearing Restrictions: No   Therapy/Group: Individual Therapy   Benjamin [name], PT, DPT  01/12/2020, 3:14 PM

## 2020-01-12 NOTE — Progress Notes (Signed)
Inpatient Rehabilitation  Patient information reviewed and entered into eRehab system by Wetona Viramontes M. Lashaunta Sicard, M.A., CCC/SLP, PPS Coordinator.  Information including medical coding, functional ability and quality indicators will be reviewed and updated through discharge.    

## 2020-01-13 ENCOUNTER — Encounter: Payer: Self-pay | Admitting: *Deleted

## 2020-01-13 ENCOUNTER — Inpatient Hospital Stay (HOSPITAL_COMMUNITY): Payer: Medicare Other | Admitting: Physical Therapy

## 2020-01-13 ENCOUNTER — Inpatient Hospital Stay (HOSPITAL_COMMUNITY): Payer: 59

## 2020-01-13 ENCOUNTER — Inpatient Hospital Stay (HOSPITAL_COMMUNITY): Payer: Medicare Other

## 2020-01-13 LAB — GLUCOSE, CAPILLARY
Glucose-Capillary: 127 mg/dL — ABNORMAL HIGH (ref 70–99)
Glucose-Capillary: 143 mg/dL — ABNORMAL HIGH (ref 70–99)
Glucose-Capillary: 169 mg/dL — ABNORMAL HIGH (ref 70–99)
Glucose-Capillary: 149 mg/dL — ABNORMAL HIGH (ref 70–99)

## 2020-01-13 MED ORDER — SENNA 8.6 MG PO TABS
2.0000 | ORAL_TABLET | Freq: Every day | ORAL | Status: DC
  Administered 2020-01-19 – 2020-01-22 (×4): 17.2 mg via ORAL
  Filled 2020-01-13 (×7): qty 2

## 2020-01-13 NOTE — Progress Notes (Signed)
Physical Therapy Session Note  Patient Details  Name: Benjamin [name] MRN: [id number] Date of Birth: 1951/09/28  Today's Date: 01/13/2020 PT Individual Time: 1445-1530 PT Individual Time Calculation (min): 45 min   Short Term Goals: Week 1:  PT Short Term Goal 1 (Week 1): Pt will complete transfers at Supervision level consistently PT Short Term Goal 2 (Week 1): Pt will ambulate x 150 ft with min A and LRAD PT Short Term Goal 3 (Week 1): Pt will navigate 4 stairs with one handrail and min A  Skilled Therapeutic Interventions/Progress Updates:    Pt received seated in recliner in room, agreeable to PT session. Pt reports 5/10 pain in back at rest that does not increase during session. Sit to stand with min A to RW. Stand pivot transfer recliner to w/c with RW and min A. TUG test: 39.8 sec, 35.8 sec, and 32.4 sec with average score of 36 sec. Pt is in high fall risk category based on this TUG score. Discussed results and functional implications with patient. Pt is min A for standing balance with RW during gait during TUG test. Pt left seated in recliner in room with needs in reach at end of session, quick release belt and chair alarm in place.  Therapy Documentation Precautions:  Precautions Precautions: Back Precaution Comments: BLT percautions  Required Braces or Orthoses: Spinal Brace Spinal Brace: Thoracolumbosacral orthotic(when ambulating) Restrictions Weight Bearing Restrictions: No    Therapy/Group: Individual Therapy   Excell Seltzer, PT, DPT  01/13/2020, 3:58 PM

## 2020-01-13 NOTE — OR Nursing (Signed)
Addendum created. Implant (Rod) added to implant section.

## 2020-01-13 NOTE — Progress Notes (Signed)
Physical Therapy Session Note  Patient Details  Name: Benjamin [name] MRN: [id number] Date of Birth: [birth year]  Today's Date: 01/13/2020 PT Individual Time: 1010-1110 PT Individual Time Calculation (min): 60 min   Short Term Goals: Week 1:  PT Short Term Goal 1 (Week 1): Pt will complete transfers at Supervision level consistently PT Short Term Goal 2 (Week 1): Pt will ambulate x 150 ft with min A and LRAD PT Short Term Goal 3 (Week 1): Pt will navigate 4 stairs with one handrail and min A Week 2:    Week 3:     Skilled Therapeutic Interventions/Progress Updates:     PAIN 5/10, treatment to tolerance, rest breaks as needed, supportive seating surfaces for rest breaks.  Increased to 7/10 thru session and meds provided by nursing.  Session ended 15 min early due to pain limiting tolerance.  Pt initially OOB in recliner, wearing TLSO and agreeable to treatment session w/ focus on general strength, functional mobility, balance  Pt STS from recliner w/min assist, moves very slowly but carefully.  Recliner to wc SPT w/RW and cga.  wc propulsion mod I but slowly x 152ft including two turns.  STS from wc w/min assist.  Gait 80ft w/RW and cga, very slow cadence.  Transfers to straight back arm chair w/cga and cues for hand placement.   Rests 2-3 min.  STS from chair w/cga and cues to RW.  Standing therex including: Heel raises x 15 Hamstring curls x 10 each side/alternating  Rests several min seated in straight back chair.  Gait 32ft w/RW w/cga, very slow candence.  Pt instructed w/and performed bilat HS stretching from wc w/foot propped on stool 2x45 each. STS from wc w/cues for safety/hand placement. Stairs: Ascends/descends 4 stairs w/2 rails w/min assist, slowly.  Pt c/o increased pain 7/10 and stated he needed to stop session.  Discussed pain meds  Pt taken back to nurses station and discussed w/nursing who provided meds and discussed options/pain control.  Pt transported to room.   STS from wc w/cues for safety/hand placment.  Transferred to reclineer w/rw and min to cga, cues. Repositioned for comfort.  Pt left oob in recliner w/chair alarm set and needs in reach.    Therapy Documentation Precautions:  Precautions Precautions: Back Precaution Comments: BLT percautions  Required Braces or Orthoses: Spinal Brace Spinal Brace: Thoracolumbosacral orthotic(when ambulating) Restrictions Weight Bearing Restrictions: No    Therapy/Group: Individual Therapy  Callie Fielding, [place] 01/13/2020, 1:00 PM

## 2020-01-13 NOTE — Progress Notes (Signed)
Occupational Therapy Session Note  Patient Details  Name: Benjamin [name] MRN: [id number] Date of Birth: [birth year]  Today's Date: 01/13/2020 OT Individual Time: [id number] OT Individual Time Calculation (min): 75 min    Short Term Goals: Week 1:  OT Short Term Goal 1 (Week 1): Pt with complete bathing with use of AE PRN Min assist OT Short Term Goal 2 (Week 1): Pt will complete toilet transfers Supervision with LRAD OT Short Term Goal 3 (Week 1): Pt will complete toileting tasks with Min assist OT Short Term Goal 4 (Week 1): Pt will maintain standing for 2 grooming tasks to demonstrate increased activity tolerance  Skilled Therapeutic Interventions/Progress Updates:    Pt sitting on toilet upon arrival with RN present. Pt with incontinent BM prior to entering room.  Pt continued to have loose BM after sitting on shower seat and returned to toilet.  OT intervention with focus on BADL training, functional amb with RW, standing balance, activity tolerane, and safety awareness to increase independence with BADLs. See Care Tool for assist levels.  Pt requires assistance donning TLSO. Pt amb with RW at CGA/supervision. Pt educated on use of reacher to assist with threading pants. Pt returned to recliner and remained seated with belt alarm activated. All needs within reach.   Therapy Documentation Precautions:  Precautions Precautions: Back Precaution Comments: BLT percautions  Required Braces or Orthoses: Spinal Brace Spinal Brace: Thoracolumbosacral orthotic(when ambulating) Restrictions Weight Bearing Restrictions: No   Pain: Pain Assessment Pain Scale: 0-10 Pain Score: 7  Pain Type: Surgical pain Pain Location: Back Pain Orientation: Mid Pain Descriptors / Indicators: Aching Pain Frequency: Constant Pain Intervention(s): meds admin at beginning of session   Therapy/Group: Individual Therapy  [name] 01/13/2020, 9:38 AM

## 2020-01-13 NOTE — Progress Notes (Signed)
Benjamin [name]   Subjective/Complaints:   Pt reports large, very large multiple BMs- loose this AM- currently on toilet having another BM.   Refused miralax due to loose stools-   Admitted he was unaware that pain meds were prn and had ot be asked for- explained will will ask nursing to give prns/encourage asking pt to give them- when receiving meds, pain doing OK.     ROS: Pt denies SOB, CP, abd pain, N/V/C- having some loose stools since finally getting cleaned out.   Objective:   DG Abd 1 View  Result Date: 01/11/2020 CLINICAL DATA:  Abdominal distension EXAM: ABDOMEN - 1 VIEW COMPARISON:  CT abdomen pelvis 10/03/2016 FINDINGS: Mild nonspecific air distention of the colon without high-grade obstructive bowel gas pattern within the small bowel. No suspicious calcifications. Prior thoracolumbar fusion is noted as well as postprocedural changes from prior lower lumbar vertebroplasty. Remaining osseous structures are unremarkable. IMPRESSION: Mild nonspecific air distention of the colon without high-grade obstructive bowel gas pattern within the small bowel. Electronically Signed   By: Lovena Le M.D.   On: 01/11/2020 21:06   Recent Labs    01/12/20 0909  WBC 5.9  HGB 11.0*  HCT 32.7*  PLT 170   Recent Labs    01/12/20 0909  NA 135  K 3.9  CL 99  CO2 28  GLUCOSE 180*  BUN 11  CREATININE 1.00  CALCIUM 7.7*    Intake/Output Summary (Last 24 hours) at 01/13/2020 1231 Last data filed at 01/13/2020 0850 Gross per 24 hour  Intake 780 ml  Output 100 ml  Net 680 ml     Physical Exam: Vital Signs Blood pressure 138/86, pulse 88, temperature 98.3 F (36.8 C), temperature source Oral, resp. rate 20, height 6' (1.829 m), weight 104.3 kg, SpO2 94 %.  Physical Exam Reviewed nursing and vitals and labs General: awake, alert, appropriate, on toilet with many staff in room, NAD  HEENT:conjugate gaze  still Neck:supple Heart:RRR- no M/R/G Chest: CTA B/L- good air movement; no accessory muscle use Abdomen:soft, NT, ND, hyperactive BS- having BM on toilet currently.  Extremities:No clubbing, cyanosis, or edema. Pulses 2+ and B/L Skin:Psoriatic patches on bilateral knees. Spinal incision has a lot of dried blood around it and drainage serosanguinous on bed from incision; no erythema; no active bleeding.  Neuro:alert Fatigued.Motor function is grossly4-/5 as patient is limited by fatigue, nausea, and pain. Musculoskeletal:Full ROM, No pain with AROM or PROM in the neck, trunk, or extremities. Posture appropriate Psych:pt irritable by people in room.      Assessment/Plan: 1. Functional deficits secondary to Vertebral thoracic fx s/p T9-L2 fusion which require 3+ hours per day of interdisciplinary therapy in a comprehensive inpatient rehab setting.  Physiatrist is providing close team supervision and 24 hour management of active medical problems listed below.  Physiatrist and rehab team continue to assess barriers to discharge/monitor patient progress toward functional and medical goals  Care Tool:  Bathing    Body parts bathed by patient: Right arm, Left arm, Chest, Abdomen, Front perineal area, Face   Body parts bathed by helper: Buttocks, Right upper leg, Left upper leg, Right lower leg, Left lower leg     Bathing assist Assist Level: Maximal Assistance - Patient 24 - 49%     Upper Body Dressing/Undressing Upper body dressing   What is the patient wearing?: Pull over shirt    Upper body assist Assist Level: Supervision/Verbal cueing    Lower  Body Dressing/Undressing Lower body dressing      What is the patient wearing?: Underwear/pull up, Pants     Lower body assist Assist for lower body dressing: Moderate Assistance - Patient 50 - 74%     Toileting Toileting    Toileting assist Assist for toileting: Maximal Assistance - Patient 25 - 49%      Transfers Chair/bed transfer  Transfers assist     Chair/bed transfer assist level: Contact Guard/Touching assist     Locomotion Ambulation   Ambulation assist      Assist level: Contact Guard/Touching assist Assistive device: Walker-rolling Max distance: 70'   Walk 10 feet activity   Assist     Assist level: Minimal Assistance - Patient > 75% Assistive device: Walker-rolling   Walk 50 feet activity   Assist    Assist level: Minimal Assistance - Patient > 75% Assistive device: Walker-rolling    Walk 150 feet activity   Assist Walk 150 feet activity did not occur: Safety/medical concerns         Walk 10 feet on uneven surface  activity   Assist Walk 10 feet on uneven surfaces activity did not occur: Safety/medical concerns         Wheelchair     Assist Will patient use wheelchair at discharge?: No             Wheelchair 50 feet with 2 turns activity    Assist            Wheelchair 150 feet activity     Assist          Blood pressure 138/86, pulse 88, temperature 98.3 F (36.8 C), temperature source Oral, resp. rate 20, height 6' (1.829 m), weight 104.3 kg, SpO2 94 %.  Medical Problem List and Plan: 1.  Impaired mobility and ADLs s/p T9-L2 posterior spinal instrumented fusion following fall due to seizure             -patient may shower but incision site must be covered             -ELOS/Goals: 9-11 days modI in PT and OT and I in SLP             -Use thoracolumbosacral orthotic when ambulating.             Spinal precautions.  2.  Antithrombotics: -DVT/anticoagulation:  Pharmaceutical: Lovenox             -antiplatelet therapy: N/A 3. Pain Management: Now on OxyContin 10 mg bid with oxycodone prn. Consider addition of Gabapentin 300mg  TID for neuropathic pain.   3/2- will increase Oxy IR to 5-15 mg q4 hours for now- and monitor- not taking prns often.  3/3- encouraged pt to ASK for prn pain meds- hasn't been.    4. Mood: LCSW to follow for evaluation and support. Has history of depression.              -antipsychotic agents: N/a 5. Neuropsych: This patient is capable of making decisions on his own behalf. 6. Skin/Wound Care: Monitor wound for healing.  7. Fluids/Electrolytes/Nutrition: Monitor I/O. Check lytes in am.   3/3- labs came back late yesterday- BUN 11 and Cr 1.0- K+ 3.8 8. Seizures: History of nocturnal seizures. Breakthrough seizures---patient unable to recall home dose. Now on Keppra 1000 mg bid. 9. Ileus? : Abdominal distension with minimal bowel sounds. He has not had a BM since admission. Will order KUB for work up. Start miralax bid and order  enema for today  3/2- had large BM after enema today- will con't to monitor BMs closely so doesn't get impacted 10. Leucocytosis: Low graded fevers noted--will check UA/UCS. Monitor for signs of infection.  3/2- U/A negative for UTI- labs still pending- drawn late today.  3/3- WBC 5.7- no signs of infection currently.   11. Hyperglycemia: Elevated fasting BS--will order ac/hs cbg X 2-3 days and check Hgb A1C.   3/2- A1c is 6.7- so pt meets criteria for DM type 2- will d/w pt possible treatments- also needs to do CBGs, which has been declining intermittently. .  12.  Chronic insomnia: Due to anxiety? Has declined sleep study. Will add xanax prn  13. Peripheral axonal neuropathy: Numbness bilateral feet and hands as well as LLE.  14. Previous constipation  3/3- added senokot 2 tabs daily to stop pt from getting backed up again. Will start tomorrow.     LOS: 2 days A FACE TO FACE EVALUATION WAS PERFORMED  Benjamin [name] 01/13/2020, 12:31 PM

## 2020-01-14 ENCOUNTER — Inpatient Hospital Stay (HOSPITAL_COMMUNITY): Payer: Medicare Other

## 2020-01-14 ENCOUNTER — Inpatient Hospital Stay (HOSPITAL_COMMUNITY): Payer: Medicare Other | Admitting: Physical Therapy

## 2020-01-14 LAB — GLUCOSE, CAPILLARY
Glucose-Capillary: 114 mg/dL — ABNORMAL HIGH (ref 70–99)
Glucose-Capillary: 108 mg/dL — ABNORMAL HIGH (ref 70–99)
Glucose-Capillary: 138 mg/dL — ABNORMAL HIGH (ref 70–99)
Glucose-Capillary: 149 mg/dL — ABNORMAL HIGH (ref 70–99)

## 2020-01-14 NOTE — IPOC Note (Addendum)
Overall Plan of Care [facility]) Patient Details Name: Benjamin [name] MRN: [id number] DOB: November 01, 1951  Admitting Diagnosis: Thoracic vertebral fracture [facility])  Hospital Problems: Principal Problem:   Thoracic vertebral fracture ([place]) Active Problems:   Peripheral axonal neuropathy   Seizures ([place])   Psoriatic arthritis ([place])   Closed T11 spinal fracture ([facility])   Diabetes ([place])   Abdominal distension     Functional Problem List: Nursing Bladder, Bowel, Endurance, Medication Management, Nutrition, Motor, Pain, Safety, Skin Integrity  PT Balance, Endurance, Pain, Safety  OT Balance, Endurance, Motor, Pain, Safety  SLP    TR         Basic ADL's: OT Grooming, Bathing, Dressing, Toileting     Advanced  ADL's: OT Laundry     Transfers: PT Bed Mobility, Bed to Chair, Car, Furniture, Futures trader, Metallurgist: PT Ambulation, Emergency planning/management officer, Stairs     Additional Impairments: OT None  SLP        TR      Anticipated Outcomes Item Anticipated Outcome  Self Feeding    Swallowing      Basic self-care  Mod i  Toileting  Mod I   Bathroom Transfers Mod I toilet, Supervision shower  Bowel/Bladder  continent of bowel and bladder with min assist  Transfers  mod I  Locomotion  mod I with LRAD  Communication     Cognition     Pain  pain less than or equal to 4/10 with min assist  Safety/Judgment  free from falls/injury and displaying appropriate safety decisions with min assist   Therapy Plan: PT Intensity: Minimum of 1-2 x/day ,45 to 90 minutes PT Frequency: 5 out of 7 days PT Duration Estimated Length of Stay: 10-14 days OT Intensity: Minimum of 1-2 x/day, 45 to 90 minutes OT Frequency: 5 out of 7 days OT Duration/Estimated Length of Stay: 10-14 days     Due to the current state of emergency, patients may not be receiving their 3-hours of Medicare-mandated therapy.   Team Interventions: Nursing Interventions Patient/Family Education,  Bladder Management, Bowel Management, Disease Management/Prevention, Pain Management, Medication Management, Skin Care/Wound Management, Discharge Planning, Psychosocial Support  PT interventions Ambulation/gait training, Training and development officer, Community reintegration, Discharge planning, Disease management/prevention, DME/adaptive equipment instruction, Functional mobility training, Neuromuscular re-education, Pain management, Patient/family education, Stair training, Therapeutic Activities, Therapeutic Exercise, UE/LE Strength taining/ROM, UE/LE Coordination activities, Wheelchair propulsion/positioning  OT Interventions Training and development officer, Academic librarian, Discharge planning, Disease mangement/prevention, Engineer, drilling, Functional mobility training, Neuromuscular re-education, Pain management, Patient/family education, Psychosocial support, Self Care/advanced ADL retraining, Skin care/wound managment, Therapeutic Activities, Therapeutic Exercise, UE/LE Strength taining/ROM, UE/LE Coordination activities  SLP Interventions    TR Interventions    SW/CM Interventions     Barriers to Discharge MD  Medical stability, Home enviroment access/loayout, New diabetic, Wound care, Lack of/limited family support and Weight bearing restrictions  Nursing      PT Decreased caregiver support, Medical stability, Home environment access/layout    OT      SLP      SW       Team Discharge Planning: Destination: PT-Home ,OT- Home , SLP-  Projected Follow-up: PT-Home health PT, OT-  Home health OT, SLP-  Projected Equipment Needs: PT-To be determined, OT- Tub/shower bench, 3 in 1 bedside comode, SLP-  Equipment Details: PT-TBD pending progress, OT-  Patient/family involved in discharge planning: PT- Patient,  OT-Patient, SLP-   MD ELOS: 10-14 days Medical Rehab Prognosis:  Good Assessment:  Pt is  a 69 yr old male with hx of seizures who fell due to seizure and  had thoracic compression fracture- s/p T9-L2 posterior fusion and needs to follow spinal precautions.  Also had ileus when came- is resolved-  Pain has been an issue but got it better controlled with meds.  Having intermittent high blood pressure- might require tx- also dx'd new DM- with A1c of 6.7.  Goals Mod I with endurance, strengthening, transfers, and ADLs.    See Team Conference Notes for weekly updates to the plan of care

## 2020-01-14 NOTE — Progress Notes (Signed)
Physical Therapy Session Note  Patient Details  Name: Benjamin [name] MRN: [id number] Date of Birth: 1951/11/05  Today's Date: 01/14/2020 PT Individual Time: 1130-1200 PT Individual Time Calculation (min): 30 min   Short Term Goals: Week 1:  PT Short Term Goal 1 (Week 1): Pt will complete transfers at Supervision level consistently PT Short Term Goal 2 (Week 1): Pt will ambulate x 150 ft with min A and LRAD PT Short Term Goal 3 (Week 1): Pt will navigate 4 stairs with one handrail and min A Week 2:    Week 3:     Skilled Therapeutic Interventions/Progress Updates:     PAIN Back pain 6/10, nursing contacted re pt requesting pain meds, treatment to tolerance Pt initially OOB in recliner and agreeable to treatment session with focus on  Cardiovascular conditioning/transfers.  TLSO donned by total assist of PT. Pt STS from recliner to rw w/cga, safe hand placement demonstrated without cues.  Gait 65ft w/RW to/from commode including negotiation of uneven surface at doorway w/cga,   Stood at commode to void w/cga, turns/backs w/cga, transfers to wc w/cga.  Pt transported to gym for continued session. Pt required set up at [facility] and performed bilat LE strengthening/cardiovascular conditioning at 70*/sec 3 min, 2 sets w/2 min recovery between sets. Pt again requesting pain meds.  Transported back to room.  STS from wc and SPT to recliner w/RW and cga, improved safety w/sequencing and hand placement today.  Pt left seated in recliner w/NT in room for further asssit.    Therapy Documentation Precautions:  Precautions Precautions: Back Precaution Comments: BLT percautions  Required Braces or Orthoses: Spinal Brace Spinal Brace: Thoracolumbosacral orthotic(when ambulating) Restrictions Weight Bearing Restrictions: No    Therapy/Group: Individual Therapy  Callie Fielding, [place] 01/14/2020, 12:23 PM

## 2020-01-14 NOTE — Progress Notes (Signed)
[facility] PHYSICAL MEDICINE & REHABILITATION PROGRESS NOTE   Subjective/Complaints:   Pt reports bowels much better- "cleaned out"- was also having difficulties with gas last night- very painful- was given some kind of med- Gas-X? And improved pain.   Pain regimen finally working- understand has to [facility] for meds now.    ROS: Pt denies SOB, CP, Abd pain, N/V/C/D   Objective:   No results found. Recent Labs    01/12/20 0909  WBC 5.9  HGB 11.0*  HCT 32.7*  PLT 170   Recent Labs    01/12/20 0909  NA 135  K 3.9  CL 99  CO2 28  GLUCOSE 180*  BUN 11  CREATININE 1.00  CALCIUM 7.7*    Intake/Output Summary (Last 24 hours) at 01/14/2020 1109 Last data filed at 01/14/2020 0825 Gross per 24 hour  Intake 1020 ml  Output --  Net 1020 ml     Physical Exam: Vital Signs Blood pressure (!) 157/95, pulse 87, temperature 98.4 F (36.9 C), temperature source Oral, resp. rate 18, height 6' (1.829 m), weight 104.3 kg, SpO2 94 %.  Physical Exam Gen: sitting up in manual w/c, shaving; OT at side; needing to shave multiple times same areas, NAD HEENT:no facial droop; EOMI B/L Neck:supple Heart:RRR Chest: CTA B/L- no resp distress; breathing comfortably.  Abdomen:soft, NT, ND, (+)BS- regular/active.  Extremities:No clubbing, cyanosis, or edema. Pulses 2+ and B/L Skin:Psoriatic patches on bilateral knees.  Spinal incision C/D/I Neuro:alert Fatigued.Motor function is grossly4-/5 as patient is limited by fatigue, nausea, and pain. Musculoskeletal:full ROM of extremities/joints Psych:  appropriate   Assessment/Plan: 1. Functional deficits secondary to Vertebral thoracic fx s/p T9-L2 fusion which require 3+ hours per day of interdisciplinary therapy in a comprehensive inpatient rehab setting.  Physiatrist is providing close team supervision and 24 hour management of active medical problems listed below.  Physiatrist and rehab team continue to assess barriers to  discharge/monitor patient progress toward functional and medical goals  Care Tool:  Bathing    Body parts bathed by patient: Right arm, Left arm, Chest, Abdomen, Front perineal area, Right upper leg, Left upper leg, Face   Body parts bathed by helper: Buttocks, Right lower leg, Left lower leg     Bathing assist Assist Level: Moderate Assistance - Patient 50 - 74%     Upper Body Dressing/Undressing Upper body dressing   What is the patient wearing?: Pull over shirt    Upper body assist Assist Level: Set up assist    Lower Body Dressing/Undressing Lower body dressing      What is the patient wearing?: Pants, Underwear/pull up     Lower body assist Assist for lower body dressing: Moderate Assistance - Patient 50 - 74%     Toileting Toileting    Toileting assist Assist for toileting: Contact Guard/Touching assist     Transfers Chair/bed transfer  Transfers assist     Chair/bed transfer assist level: Minimal Assistance - Patient > 75%     Locomotion Ambulation   Ambulation assist      Assist level: Contact Guard/Touching assist Assistive device: Walker-rolling Max distance: 75   Walk 10 feet activity   Assist     Assist level: Contact Guard/Touching assist Assistive device: Walker-rolling   Walk 50 feet activity   Assist    Assist level: Contact Guard/Touching assist Assistive device: Walker-rolling    Walk 150 feet activity   Assist Walk 150 feet activity did not occur: Safety/medical concerns  Walk 10 feet on uneven surface  activity   Assist Walk 10 feet on uneven surfaces activity did not occur: Safety/medical concerns         Wheelchair     Assist Will patient use wheelchair at discharge?: No Type of Wheelchair: Manual    Wheelchair assist level: Supervision/Verbal cueing      Wheelchair 50 feet with 2 turns activity    Assist        Assist Level: Supervision/Verbal cueing   Wheelchair 150  feet activity     Assist      Assist Level: Supervision/Verbal cueing   Blood pressure (!) 157/95, pulse 87, temperature 98.4 F (36.9 C), temperature source Oral, resp. rate 18, height 6' (1.829 m), weight 104.3 kg, SpO2 94 %.  Medical Problem List and Plan: 1.  Impaired mobility and ADLs s/p T9-L2 posterior spinal instrumented fusion following fall due to seizure             -patient may shower but incision site must be covered             -ELOS/Goals: 9-11 days modI in PT and OT and I in SLP             -Use thoracolumbosacral orthotic when ambulating.             Spinal precautions.  2.  Antithrombotics: -DVT/anticoagulation:  Pharmaceutical: Lovenox             -antiplatelet therapy: N/A 3. Pain Management: Now on OxyContin 10 mg bid with oxycodone prn. Consider addition of Gabapentin 300mg  TID for neuropathic pain.   3/2- will increase Oxy IR to 5-15 mg q4 hours for now- and monitor- not taking prns often.  3/3- encouraged pt to ASK for prn pain meds- hasn't been.    3/4- pain better controlled now that ASKING for prns- working much better 4. Mood: LCSW to follow for evaluation and support. Has history of depression.              -antipsychotic agents: N/a 5. Neuropsych: This patient is capable of making decisions on his own behalf. 6. Skin/Wound Care: Monitor wound for healing.  7. Fluids/Electrolytes/Nutrition: Monitor I/O. Check lytes in am.   3/3- labs came back late yesterday- BUN 11 and Cr 1.0- K+ 3.8 8. Seizures: History of nocturnal seizures. Breakthrough seizures---patient unable to recall home dose. Now on Keppra 1000 mg bid.  3/4- no seizure activity- con't regimen 9. Ileus? : Abdominal distension with minimal bowel sounds. He has not had a BM since admission. Will order KUB for work up. Start miralax bid and order enema for today  3/2- had large BM after enema today- will con't to monitor BMs closely so doesn't get impacted  3/4- started Senokot 2 tabs daily-  will hold off miralax/additonal meds for now- monitor 10. Leucocytosis: Low graded fevers noted--will check UA/UCS. Monitor for signs of infection.  3/2- U/A negative for UTI- labs still pending- drawn late today.  3/3- WBC 5.7- no signs of infection currently.   11. Hyperglycemia: Elevated fasting BS--will order ac/hs cbg X 2-3 days and check Hgb A1C.   3/2- A1c is 6.7- so pt meets criteria for DM type 2- will d/w pt possible treatments- also needs to do CBGs, which has been declining intermittently.   CBG (last 3)  Recent Labs    01/13/20 1704 01/13/20 2222 01/14/20 0649  GLUCAP 169* 149* 114*   3/4- fair controlled overall- will d/w pt starting metformin  to get even lower/better control 12.  Chronic insomnia: Due to anxiety? Has declined sleep study. Will add xanax prn  13. Peripheral axonal neuropathy: Numbness bilateral feet and hands as well as LLE.  14. Previous constipation  3/3- added senokot 2 tabs daily to stop pt from getting backed up again. Will start tomorrow.   15. High Blood Pressure- was 157/95 this AM- is normally 120s-130s over 80s- might be due to too small cuff? Will monitor trends and treat if needed.     LOS: 3 days A FACE TO FACE EVALUATION WAS PERFORMED  Rhianna Raulerson 01/14/2020, 11:09 AM

## 2020-01-14 NOTE — Progress Notes (Signed)
Occupational Therapy Session Note  Patient Details  Name: Terick Hatheway MRN: [id number] Date of Birth: Dec 20, 1950  Today's Date: 01/14/2020 OT Individual Time: 1400-1440 OT Individual Time Calculation (min): 40 min    Short Term Goals: Week 1:  OT Short Term Goal 1 (Week 1): Pt with complete bathing with use of AE PRN Min assist OT Short Term Goal 2 (Week 1): Pt will complete toilet transfers Supervision with LRAD OT Short Term Goal 3 (Week 1): Pt will complete toileting tasks with Min assist OT Short Term Goal 4 (Week 1): Pt will maintain standing for 2 grooming tasks to demonstrate increased activity tolerance  Skilled Therapeutic Interventions/Progress Updates:    Pt resting in recliner upon arrival and agreeable to therapy.  OT intervention with focus on discharge planning, DME requirements, and practicing TTB transfers.  Pt practiced TTB transfers X 3 with CGA. Pt requested to use toilet and stood at toilet to void.  Pt stood at sink to wash hands. Pt returned to recliner and remained in w/c with all needs within reach. Belt alarm activated.  Therapy Documentation Precautions:  Precautions Precautions: Back Precaution Comments: BLT percautions  Required Braces or Orthoses: Spinal Brace Spinal Brace: Thoracolumbosacral orthotic(when ambulating) Restrictions Weight Bearing Restrictions: No  Pain: Pt c/o 7/10 back pain; RN admin meds during session Therapy/Group: Individual Therapy  [name] 01/14/2020, 2:46 PM

## 2020-01-14 NOTE — Progress Notes (Signed)
Occupational Therapy Session Note  Patient Details  Name: Benjamin [name] MRN: [id number] Date of Birth: [birth year]  Today's Date: 01/14/2020 OT Individual Time: 0800-0900 OT Individual Time Calculation (min): 60 min    Short Term Goals: Week 1:  OT Short Term Goal 1 (Week 1): Pt with complete bathing with use of AE PRN Min assist OT Short Term Goal 2 (Week 1): Pt will complete toilet transfers Supervision with LRAD OT Short Term Goal 3 (Week 1): Pt will complete toileting tasks with Min assist OT Short Term Goal 4 (Week 1): Pt will maintain standing for 2 grooming tasks to demonstrate increased activity tolerance  Skilled Therapeutic Interventions/Progress Updates:    Pt resting in bed upon arrival and agreeable to getting OOB. Supine>sit EOB with supervision using bed rails.  Pt requested to use toilet and amb with RW with CGA.  Pt stood at toilet to void. Pt returned to room and stood at sink to wash hands before sitting in w/c to complete grooming tasks.  Bathing/dressing with sit<>stand at sink secondary to time constraints. Sit<>stand with supervision.  Pt required assistance with cleaning buttocks while standing.  Pt required assistance threading pants without use of reacher but able to pull over hips while standing-CGA. Pt requires more than a reasonable amount of time to complete tasks and is quite chatty at times. Pt transferred to recliner.  Pt remained in recliner with all needs within reach and belt alarm activated.   Therapy Documentation Precautions:  Precautions Precautions: Back Precaution Comments: BLT percautions  Required Braces or Orthoses: Spinal Brace Spinal Brace: Thoracolumbosacral orthotic(when ambulating) Restrictions Weight Bearing Restrictions: No  Pain:  5/10 at rest; 7/10 with mobility-back pain; repositioned   Therapy/Group: Individual Therapy  [name] 01/14/2020, 10:17 AM

## 2020-01-14 NOTE — Progress Notes (Signed)
Physical Therapy Session Note  Patient Details  Name: Benjamin [name] MRN: [id number] Date of Birth: 1951-04-29  Today's Date: 01/14/2020 PT Individual Time: 1000-1100 PT Individual Time Calculation (min): 60 min   Short Term Goals: Week 1:  PT Short Term Goal 1 (Week 1): Pt will complete transfers at Supervision level consistently PT Short Term Goal 2 (Week 1): Pt will ambulate x 150 ft with min A and LRAD PT Short Term Goal 3 (Week 1): Pt will navigate 4 stairs with one handrail and min A  Skilled Therapeutic Interventions/Progress Updates:    Pt received seated in recliner in room, agreeable to PT session. Pt reports 8-9/10 pain in back at rest, RN notified and able to provide pt with pain medication at beginning of session. Also demonstrated different positions that pt can sit in in recliner for improved comfort. Sit to stand with min A to RW throughout session. Ambulation x 200 ft with RW and CGA, focus on upright posture as pt tends to stand with flexed neck and trunk. Pt also exhibits B shoulder elevation during gait which he is able to correct with cueing. Pt exhibits improved tolerance for gait training this date. Demonstrated how to safely navigate stairs with one handrail laterally in order to simulate pt's home environment. Pt is able to perform 4 x 6" stairs laterally with R handrail and min A for balance. Pt reports increase in back pain while descending stairs that decreases once seated in w/c again. Stand pivot transfer w/c to recliner with RW and CGA. Pt left seated in recliner in room with needs in reach at end of session.  Therapy Documentation Precautions:  Precautions Precautions: Back Precaution Comments: BLT percautions  Required Braces or Orthoses: Spinal Brace Spinal Brace: Thoracolumbosacral orthotic(when ambulating) Restrictions Weight Bearing Restrictions: No    Therapy/Group: Individual Therapy   Excell Seltzer, PT, DPT  01/14/2020, 12:13 PM

## 2020-01-14 NOTE — Plan of Care (Signed)
  Problem: Consults Goal: RH SPINAL CORD INJURY PATIENT EDUCATION Description:  See Patient Education module for education specifics.  Outcome: Progressing Goal: Skin Care Protocol Initiated - if Braden Score 18 or less Description: If consults are not indicated, leave blank or document N/A Outcome: Progressing Goal: Nutrition Consult-if indicated Outcome: Progressing Goal: Diabetes Guidelines if Diabetic/Glucose > 140 Description: If diabetic or lab glucose is > 140 mg/dl - Initiate Diabetes/Hyperglycemia Guidelines & Document Interventions  Outcome: Progressing   Problem: SCI BOWEL ELIMINATION Goal: RH STG MANAGE BOWEL WITH ASSISTANCE Description: STG Manage Bowel with mod I Assistance. Outcome: Progressing Goal: RH STG SCI MANAGE BOWEL WITH MEDICATION WITH ASSISTANCE Description: STG SCI Manage bowel with medication with mod I assistance. Outcome: Progressing Goal: RH STG MANAGE BOWEL W/EQUIPMENT W/ASSISTANCE Description: STG Manage Bowel With Equipment With Assistance Outcome: Progressing Goal: RH STG SCI MANAGE BOWEL PROGRAM W/ASSIST OR AS APPROPRIATE Description: STG SCI Manage bowel program w/assist or as appropriate. Outcome: Progressing Goal: RH OTHER STG BOWEL ELIMINATION GOALS W/ASSIST Description: Other STG Bowel Elimination Goals With Assistance. Outcome: Progressing   Problem: SCI BLADDER ELIMINATION Goal: RH STG MANAGE BLADDER WITH ASSISTANCE Description: STG Manage Bladder With mod I Assistance Outcome: Progressing Goal: RH STG MANAGE BLADDER WITH MEDICATION WITH ASSISTANCE Description: STG Manage Bladder With Medication With mod I Assistance. Outcome: Progressing Goal: RH STG MANAGE BLADDER WITH EQUIPMENT WITH ASSISTANCE Description: STG Manage Bladder With Equipment With Assistance Outcome: Progressing Goal: RH STG SCI MANAGE BLADDER PROGRAM W/ASSISTANCE Outcome: Progressing Goal: RH OTHER STG BLADDER ELIMINATION GOALS W/ASSIST Description: Other STG  Bladder Elimination Goals With Assistance Outcome: Progressing   Problem: RH SKIN INTEGRITY Goal: RH STG SKIN FREE OF INFECTION/BREAKDOWN Outcome: Progressing Goal: RH STG MAINTAIN SKIN INTEGRITY WITH ASSISTANCE Description: STG Maintain Skin Integrity With mod I Assistance. Outcome: Progressing Goal: RH STG ABLE TO PERFORM INCISION/WOUND CARE W/ASSISTANCE Description: STG Able To Perform Incision/Wound Care With Assistance. Outcome: Progressing   Problem: RH SAFETY Goal: RH STG ADHERE TO SAFETY PRECAUTIONS W/ASSISTANCE/DEVICE Description: STG Adhere to Safety Precautions With Assistance/Device. Outcome: Progressing Goal: RH STG DECREASED RISK OF FALL WITH ASSISTANCE Description: STG Decreased Risk of Fall With mod I Assistance. Outcome: Progressing   Problem: RH PAIN MANAGEMENT Goal: RH STG PAIN MANAGED AT OR BELOW PT'S PAIN GOAL Description: Pain scale less than 5/10 Outcome: Progressing   Problem: RH KNOWLEDGE DEFICIT SCI Goal: RH STG INCREASE KNOWLEDGE OF SELF CARE AFTER SCI Outcome: Progressing

## 2020-01-15 ENCOUNTER — Inpatient Hospital Stay (HOSPITAL_COMMUNITY): Payer: Medicare Other | Admitting: Physical Therapy

## 2020-01-15 ENCOUNTER — Inpatient Hospital Stay (HOSPITAL_COMMUNITY): Payer: Medicare Other

## 2020-01-15 ENCOUNTER — Inpatient Hospital Stay (HOSPITAL_COMMUNITY): Payer: Medicare Other | Admitting: Occupational Therapy

## 2020-01-15 LAB — GLUCOSE, CAPILLARY
Glucose-Capillary: 126 mg/dL — ABNORMAL HIGH (ref 70–99)
Glucose-Capillary: 120 mg/dL — ABNORMAL HIGH (ref 70–99)
Glucose-Capillary: 145 mg/dL — ABNORMAL HIGH (ref 70–99)

## 2020-01-15 MED ORDER — OXYCODONE HCL ER 15 MG PO T12A
15.0000 mg | EXTENDED_RELEASE_TABLET | Freq: Two times a day (BID) | ORAL | Status: DC
  Administered 2020-01-15 – 2020-01-20 (×10): 15 mg via ORAL
  Filled 2020-01-15 (×10): qty 1

## 2020-01-15 NOTE — Progress Notes (Signed)
Occupational Therapy Session Note  Patient Details  Name: Benjamin [name] MRN: [id number] Date of Birth: October 01, 1951  Today's Date: 01/15/2020 OT Individual Time: [id number] OT Individual Time Calculation (min): 72 min    Short Term Goals: Week 1:  OT Short Term Goal 1 (Week 1): Pt with complete bathing with use of AE PRN Min assist OT Short Term Goal 2 (Week 1): Pt will complete toilet transfers Supervision with LRAD OT Short Term Goal 3 (Week 1): Pt will complete toileting tasks with Min assist OT Short Term Goal 4 (Week 1): Pt will maintain standing for 2 grooming tasks to demonstrate increased activity tolerance  Skilled Therapeutic Interventions/Progress Updates:    OT intervention with focus on sit<>stand, standing balance, functional amb with RW, bathng at shower level, dressing with sit<>stand from recliner, activity tolerance, and safety awareness to increase independence with BADLs.  Pt dependent for donning/doffing TLSO. Functional amb with RW at [facility] to bathroom for toileting and shower.  Pt stands at toilet to urinate-CGA. Pt requires assistance to bathe buttocks and lower legs without AE.  Pt uses reacher for donning Depends.  Sock aid introduced and pt dons socks with supervision.  Pt requires more than a reasonable amount of time to complete tasks with rest breaks.  See Care Tool. Pt remained in w/c with all needs within reach.   Therapy Documentation Precautions:  Precautions Precautions: Back Precaution Comments: BLT percautions  Required Braces or Orthoses: Spinal Brace Spinal Brace: Thoracolumbosacral orthotic(when ambulating) Restrictions Weight Bearing Restrictions: No   Pain:  Pt c/o 5/10 pain in back; shower and repositioned   Therapy/Group: Individual Therapy  [name] 01/15/2020, 8:15 AM

## 2020-01-15 NOTE — Progress Notes (Signed)
Occupational Therapy Session Note  Patient Details  Name: Benjamin [name] MRN: [id number] Date of Birth: 05-23-1951  Today's Date: 01/15/2020 OT Individual Time: 1430-1500 OT Individual Time Calculation (min): 30 min    Short Term Goals: Week 1:  OT Short Term Goal 1 (Week 1): Pt with complete bathing with use of AE PRN Min assist OT Short Term Goal 2 (Week 1): Pt will complete toilet transfers Supervision with LRAD OT Short Term Goal 3 (Week 1): Pt will complete toileting tasks with Min assist OT Short Term Goal 4 (Week 1): Pt will maintain standing for 2 grooming tasks to demonstrate increased activity tolerance  Skilled Therapeutic Interventions/Progress Updates:    1:! Pt in recliner when arrived. Pt donned TLSO with mod A. Ambulated to the bathroom with contact guard and performed voiding in standing with contact guard. Pt sat on seat to change clean brief and don pants again with reacher. PT required extra time and instructional cues. Pt able to ambulated out of the bathroom and perform hand hygiene at the sink with contact guard. Pt taken out of the room and to the dayroom to look outside. Ambulated about ~30 feet before wanting to rest and return to the room. Left resting in the the w/c at home with chair alarm.   Therapy Documentation Precautions:  Precautions Precautions: Back Precaution Comments: BLT percautions  Required Braces or Orthoses: Spinal Brace Spinal Brace: Thoracolumbosacral orthotic(when ambulating) Restrictions Weight Bearing Restrictions: No General:   Vital Signs: Therapy Vitals Temp: (!) 100.9 F (38.3 C) Pulse Rate: 88 Resp: 20 BP: (!) 150/93 Patient Position (if appropriate): Lying Oxygen Therapy SpO2: 98 % O2 Device: Room Air Pain:  ongoing discomfort in back and wanting to get up and move around to assist with his discomfort. At end of session appreciated moving around   Therapy/Group: Individual Therapy  Willeen Cass [facility] 01/15/2020, 6:16  PM

## 2020-01-15 NOTE — Progress Notes (Signed)
Physical Therapy Session Note  Patient Details  Name: Benjamin [name] MRN: [id number] Date of Birth: 05-13-1951  Today's Date: 01/15/2020 PT Individual Time: 0900-1000 PT Individual Time Calculation (min): 60 min   Short Term Goals: Week 1:  PT Short Term Goal 1 (Week 1): Pt will complete transfers at Supervision level consistently PT Short Term Goal 2 (Week 1): Pt will ambulate x 150 ft with min A and LRAD PT Short Term Goal 3 (Week 1): Pt will navigate 4 stairs with one handrail and min A  Skilled Therapeutic Interventions/Progress Updates:    Pt received seated in recliner, agreeable to PT session. Pt reports some pain in back at rest, not rated. Pt's pain increases to 8/10 during session and limits his ability to participate functionally. Pt reports being premedicated prior to start of therapy session and declines any other intervention. Sit to stand with CGA to min A to RW throughout session. Stand pivot transfer recliner to w/c with RW and CGA. Manual w/c propulsion x 50 ft with use of BUE at Supervision level with cueing for correct hand placement on w/c for global endurance training. Pt reports increase in back pain after 50 ft of propulsion and declines any further w/c mobility. Sit to stand x 5 reps from elevated mat to RW with CGA with focus on safe hand placement. Attempt sit to stand with no UE support but pt reports significant increase in back pain. Attempt standing mini-squats with use of RW for support, pt again has significant increase in back pain so exercise deferred. Attempt to perform semi-reclined therex on therapy mat table, pt unable to tolerate positioning even with therapy wedge. Assisted pt back to room to perform therex at bed level. Pt is min A for sit to semi-reclined in bed. Semi-reclined BLE strengthening therex: heel slides, hip abd, SAQ x 20 reps each. Pt requests to return to recliner at end of session. Semi-reclined to sitting EOB with min A. Stand pivot transfer bed to  recliner with RW and CGA. Pt left seated in recliner in room with needs in reach, quick release belt and chair alarm in place at end of session.  Therapy Documentation Precautions:  Precautions Precautions: Back Precaution Comments: BLT percautions  Required Braces or Orthoses: Spinal Brace Spinal Brace: Thoracolumbosacral orthotic(when ambulating) Restrictions Weight Bearing Restrictions: No    Therapy/Group: Individual Therapy   Excell Seltzer, PT, DPT  01/15/2020, 4:02 PM

## 2020-01-15 NOTE — Plan of Care (Signed)
  Problem: Consults Goal: RH SPINAL CORD INJURY PATIENT EDUCATION Description:  See Patient Education module for education specifics.  Outcome: Progressing Goal: Skin Care Protocol Initiated - if Braden Score 18 or less Description: If consults are not indicated, leave blank or document N/A Outcome: Progressing Goal: Nutrition Consult-if indicated Outcome: Progressing Goal: Diabetes Guidelines if Diabetic/Glucose > 140 Description: If diabetic or lab glucose is > 140 mg/dl - Initiate Diabetes/Hyperglycemia Guidelines & Document Interventions  Outcome: Progressing   Problem: SCI BOWEL ELIMINATION Goal: RH STG MANAGE BOWEL WITH ASSISTANCE Description: STG Manage Bowel with mod I Assistance. Outcome: Progressing Goal: RH STG SCI MANAGE BOWEL WITH MEDICATION WITH ASSISTANCE Description: STG SCI Manage bowel with medication with mod I assistance. Outcome: Progressing Goal: RH STG MANAGE BOWEL W/EQUIPMENT W/ASSISTANCE Description: STG Manage Bowel With Equipment With Assistance Outcome: Progressing Goal: RH STG SCI MANAGE BOWEL PROGRAM W/ASSIST OR AS APPROPRIATE Description: STG SCI Manage bowel program w/assist or as appropriate. Outcome: Progressing Goal: RH OTHER STG BOWEL ELIMINATION GOALS W/ASSIST Description: Other STG Bowel Elimination Goals With Assistance. Outcome: Progressing   Problem: SCI BLADDER ELIMINATION Goal: RH STG MANAGE BLADDER WITH ASSISTANCE Description: STG Manage Bladder With mod I Assistance Outcome: Progressing Goal: RH STG MANAGE BLADDER WITH MEDICATION WITH ASSISTANCE Description: STG Manage Bladder With Medication With mod I Assistance. Outcome: Progressing Goal: RH STG MANAGE BLADDER WITH EQUIPMENT WITH ASSISTANCE Description: STG Manage Bladder With Equipment With Assistance Outcome: Progressing Goal: RH STG SCI MANAGE BLADDER PROGRAM W/ASSISTANCE Outcome: Progressing Goal: RH OTHER STG BLADDER ELIMINATION GOALS W/ASSIST Description: Other STG  Bladder Elimination Goals With Assistance Outcome: Progressing   Problem: RH SKIN INTEGRITY Goal: RH STG SKIN FREE OF INFECTION/BREAKDOWN Outcome: Progressing Goal: RH STG MAINTAIN SKIN INTEGRITY WITH ASSISTANCE Description: STG Maintain Skin Integrity With mod I Assistance. Outcome: Progressing Goal: RH STG ABLE TO PERFORM INCISION/WOUND CARE W/ASSISTANCE Description: STG Able To Perform Incision/Wound Care With Assistance. Outcome: Progressing   Problem: RH SAFETY Goal: RH STG ADHERE TO SAFETY PRECAUTIONS W/ASSISTANCE/DEVICE Description: STG Adhere to Safety Precautions With Assistance/Device. Outcome: Progressing Goal: RH STG DECREASED RISK OF FALL WITH ASSISTANCE Description: STG Decreased Risk of Fall With mod I Assistance. Outcome: Progressing   Problem: RH PAIN MANAGEMENT Goal: RH STG PAIN MANAGED AT OR BELOW PT'S PAIN GOAL Description: Pain scale less than 5/10 Outcome: Progressing   Problem: RH KNOWLEDGE DEFICIT SCI Goal: RH STG INCREASE KNOWLEDGE OF SELF CARE AFTER SCI Outcome: Progressing

## 2020-01-15 NOTE — Progress Notes (Signed)
Social Work Assessment and Plan   Patient Details  Name: Benjamin [name] MRN: [id number] Date of Birth: 30-Nov-1950  Today's Date: 01/15/2020  Problem List:  Patient Active Problem List   Diagnosis Date Noted  . Diabetes ([place]) 01/12/2020  . Abdominal distension   . Thoracic vertebral fracture ([place]) 01/11/2020  . Closed T11 spinal fracture ([place])   . Psychophysiological insomnia 08/18/2018  . Psoriatic arthritis ([place]) 08/18/2018  . Partial symptomatic epilepsy with complex partial seizures, not intractable, without status epilepticus ([place]) 08/18/2018  . Subjective memory complaints 08/15/2017  . Seizures ([place]) 08/15/2017  . Peripheral axonal neuropathy 08/06/2014  . Unspecified hereditary and idiopathic peripheral neuropathy 07/07/2014  . Lumbar stenosis with neurogenic claudication 12/23/2013  . Other forms of epilepsy and recurrent seizures without mention of intractable epilepsy 09/16/2013  . Prostate cancer ([place]) 07/03/2012   Past Medical History:  Past Medical History:  Diagnosis Date  . Acute prostatitis 07/03/12   s/p prostate   . Allergy   . Anxiety   . Arthritis    stenosis lower back  . Back pain   . BPH (benign prostatic hypertrophy) with urinary obstruction   . Depression   . ED (erectile dysfunction)   . Headache(784.0)    "sinus" headaches  . History of ITP    as a child per patient  . Hypercholesterolemia   . Other forms of epilepsy and recurrent seizures without mention of intractable epilepsy 09/16/2013  . Peripheral axonal neuropathy 08/06/2014  . Prostate cancer ([place]) 07/03/12   Adenocarcinoma,gleason=3+3=6,PSA=5.0,volume=20cc  . Seizures ([place])   . Unspecified hereditary and idiopathic peripheral neuropathy 07/07/2014   Foot numbness starting 2010, hands beginning in 2014- 15 .   Past Surgical History:  Past Surgical History:  Procedure Laterality Date  . BACK SURGERY    . COLONOSCOPY    . KNEE ARTHROSCOPY  2017  . LUMBAR LAMINECTOMY/DECOMPRESSION  MICRODISCECTOMY N/A 12/23/2013   Procedure: Lumbar Laminectomy Decompression, Lumbar Two, Three, Four, Five;  Surgeon: Hosie Spangle, MD;  Location: [facility];  Service: Neurosurgery;  Laterality: N/A;  . LYMPHADENECTOMY Bilateral 05/25/2016   Procedure: BILATERAL PELVIC LYMPHADENECTOMY;  Surgeon: Alexis Frock, MD;  Location: [facility];  Service: Urology;  Laterality: Bilateral;  . POSTERIOR LUMBAR FUSION 4 LEVEL N/A 01/08/2020   Procedure: THORACIC NINE TO LUMBAR TWO POSTERIOR SPINAL INSTRUMENTED FUSION;  Surgeon: Judith Part, MD;  Location: [facility];  Service: Neurosurgery;  Laterality: N/A;  . ROBOT ASSISTED LAPAROSCOPIC RADICAL PROSTATECTOMY N/A 05/25/2016   Procedure: XI ROBOTIC ASSISTED LAPAROSCOPIC RADICAL PROSTATECTOMY WITH INDOCYANINE GREEN DYE;  Surgeon: Alexis Frock, MD;  Location: [facility];  Service: Urology;  Laterality: N/A;  . TOE SURGERY     Social History:  reports that he has never smoked. He has never used smokeless tobacco. He reports current alcohol use. He reports that he does not use drugs.  Family / Support Systems Marital Status: Married How Long?: 25 years Patient Roles: Spouse, Partner Spouse/Significant Other: [name] (wife): [phone] Children: Adult dtr (20 yr old) Other Supports: Jilda Roche will check in intermittently Anticipated Caregiver: Pt primary caregiver will be his wife. Ability/Limitations of Caregiver: Wife works during the day and his adult dtr works 3rd shift Caregiver Availability: 24/7 Family Dynamics: Pt lives with his wife, and their adult dtr who is currently in college and works 3rd shift  Social History Preferred language: English Religion: Christian Cultural Background: Pt worked with [employer] for 35 years Education: high school Read: Yes Write: Yes Employment Status: Retired Date Retired/Disabled/Unemployed:  2019 Legal History/Current Legal Issues: Denies Guardian/Conservator: Denies   Abuse/Neglect Abuse/Neglect Assessment  Can Be Completed: Yes Physical Abuse: Denies Verbal Abuse: Denies Sexual Abuse: Denies Exploitation of patient/patient's resources: Denies Self-Neglect: Denies  Emotional Status Pt's affect, behavior and adjustment status: Pt appears to be adjusting to his medical condition Recent Psychosocial Issues: Denies Psychiatric History: Admits to depression but does not take any medications. States stays active by exercising such as hiking, running etc. Substance Abuse History: Denies  Patient / Family Perceptions, Expectations & Goals Pt/Family understanding of illness & functional limitations: Pt and wife have a general understanding of pt condition Anticipated changes in roles/activities/participation: Will require transportation to/from medical appointments Pt/family expectations/goals: Pt goal is to get stronger  [employer]: None Premorbid Home Care/DME Agencies: None Transportation available at discharge: Wife to transport Resource referrals recommended: Neuropsychology  Discharge Planning Living Arrangements: Spouse/significant other, Children Support Systems: Spouse/significant other, Children, Friends/neighbors Type of Residence: Private residence Insurance Resources: [employer], Multimedia programmer (specify)([employer] (primary)) Financial Resources: SSI, Other (Comment)(retirement/savings) Financial Screen Referred: No Living Expenses: Own Money Management: Patient, Spouse Does the patient have any problems obtaining your medications?: No Home Management: Pt and wife split managing bills Social Work Anticipated Follow Up Needs: HH/OP Expected length of stay: 10-14 days  Clinical Impression SW met with pt in room to introduce self, explain role, and discuss discharge process.   *SW called pt wife [name] [phone]) to introduce self, explain role, and discuss discharge process. Pt wife reports she does not work far from home and will check in on pt  periodically during the day. She also states their adult dtr lives in the home. States there are also neighbors that can help as well. Will finalize the best plan of care.   Rana Snare 01/15/2020, 4:23 PM

## 2020-01-15 NOTE — Progress Notes (Signed)
Occupational Therapy Session Note  Patient Details  Name: Benjamin [name] MRN: [id number] Date of Birth: [birth year]  Today's Date: 01/15/2020 OT Individual Time: 1130-1155 OT Individual Time Calculation (min): 25 min    Short Term Goals: Week 1:  OT Short Term Goal 1 (Week 1): Pt with complete bathing with use of AE PRN Min assist OT Short Term Goal 2 (Week 1): Pt will complete toilet transfers Supervision with LRAD OT Short Term Goal 3 (Week 1): Pt will complete toileting tasks with Min assist OT Short Term Goal 4 (Week 1): Pt will maintain standing for 2 grooming tasks to demonstrate increased activity tolerance     Skilled Therapeutic Interventions/Progress Updates:    Pt received in recliner c/o significant pain but agreeable to attempting therapy. UE AROM from recliner, donned brace with mod A. Sit to stand to RW with CGA but pt could not tolerate standing so transferred to EOB.  Pt in too much pain to sit.  layed down and engaged in theraband exercises for UE. Pt resting in bed with all needs met.   Therapy Documentation Precautions:  Precautions Precautions: Back Precaution Comments: BLT percautions  Required Braces or Orthoses: Spinal Brace Spinal Brace: Thoracolumbosacral orthotic(when ambulating) Restrictions Weight Bearing Restrictions: No    Pain: Pain Assessment Pain Scale: 0-10 Pain Score: 7  Pain Type: Chronic pain Pain Location: Back Pain Orientation: Lower Pain Descriptors / Indicators: Aching Pain Onset: On-going Patients Stated Pain Goal: 2 Pain Intervention(s): Medication (See eMAR) ADL: ADL Upper Body Bathing: Minimal assistance Where Assessed-Upper Body Bathing: Shower Lower Body Bathing: Maximal assistance Where Assessed-Lower Body Bathing: Shower Upper Body Dressing: Minimal assistance Where Assessed-Upper Body Dressing: Edge of bed Lower Body Dressing: Dependent Where Assessed-Lower Body Dressing: Edge of bed Toileting: Maximal  assistance Where Assessed-Toileting: Toilet(BSC over toilet) Toilet Transfer: Minimal assistance, Moderate assistance(Min ambulating, Mod to stand from toilet surface) Toilet Transfer Method: Insurance claims handler Equipment: Bedside commode(over toilet) Social research officer, government: Moderate assistance Social research officer, government Method: Print production planner with back, Grab bars   Therapy/Group: Individual Therapy  [name] 01/15/2020, 9:19 AM

## 2020-01-15 NOTE — Care Management (Addendum)
Inpatient [facility] Individual Statement of Services  Patient Name:  Benjamin [name]  Date:  01/15/2020  Welcome to the [facility].  Our goal is to provide you with an individualized program based on your diagnosis and situation, designed to meet your specific needs.  With this comprehensive rehabilitation program, you will be expected to participate in at least 3 hours of rehabilitation therapies Monday-Friday, with modified therapy programming on the weekends.  Your rehabilitation program will include the following services:  Physical Therapy (PT), Occupational Therapy (OT), Speech Therapy (ST), 24 hour per day rehabilitation nursing, Therapeutic Recreaction (TR), Psychology, Neuropsychology, Case Management (Social Worker), Rehabilitation Medicine, Nutrition Services, Pharmacy Services and Other  Weekly team conferences will be held on Tuesdays to discuss your progress.  Your Social Worker will talk with you frequently to get your input and to update you on team discussions.  Team conferences with you and your family in attendance may also be held.  Expected length of stay: 10-14 days   Overall anticipated outcome: Supervision  Depending on your progress and recovery, your program may change. Your Social Worker will coordinate services and will keep you informed of any changes. Your Social Worker's name and contact numbers are listed  below.  The following services may also be recommended but are not provided by the [facility] will be made to provide these services after discharge if needed.  Arrangements include referral to agencies that provide these services.  Your insurance has been verified to be: [place] and Medicare A/B     Your primary doctor is:  Cyndi Bender  Pertinent information will be  shared with your doctor and your insurance company.  Social Worker:  Loralee Pacas, LCSWA  Information discussed with and copy given to patient by: Rana Snare, 01/15/2020, 1:43 PM

## 2020-01-15 NOTE — Progress Notes (Signed)
Benjamin [name]   Subjective/Complaints:   Pt reports OK, overall, but hurting and took pain meds "afew hours ago" wants ot know when due- I explained th last few days took meds at 5am so usually due around 9am.  Will ask nursing.    ROS: Pt denies SOB, CP, N/V/C/D  Objective:   No results found. No results for input(s): WBC, HGB, HCT, PLT in the last 72 hours. No results for input(s): NA, K, CL, CO2, GLUCOSE, BUN, CREATININE, CALCIUM in the last 72 hours.  Intake/Output Summary (Last 24 hours) at 01/15/2020 1024 Last data filed at 01/15/2020 0700 Gross per 24 hour  Intake 760 ml  Output --  Net 760 ml     Physical Exam: Vital Signs Blood pressure 116/79, pulse 82, temperature 99.2 F (37.3 C), resp. rate 19, height 6' (1.829 m), weight 104.3 kg, SpO2 96 %.  Physical Exam Gen: sitting up in bedside chair, in bed safety belt, appropriate, NAD HEENT:no facial droop; EOMI B/L Neck:supple Heart:RRR, no M/R/G Chest: CTA B/L- good air movement  Abdomen:soft, NT, ND; (+)BS- hypoactive Extremities:No clubbing, cyanosis, or edema. Pulses 2+ and B/L Skin:Psoriatic patches on bilateral knees.  Spinal incision C/D/I Neuro:alert Ox3- less fatigued than previous days.  Musculoskeletal:full ROM of extremities/joints Psych:  Still appropriate   Assessment/Plan: 1. Functional deficits secondary to Vertebral thoracic fx s/p T9-L2 fusion which require 3+ hours per day of interdisciplinary therapy in a comprehensive inpatient rehab setting.  Physiatrist is providing close team supervision and 24 hour management of active medical problems listed below.  Physiatrist and rehab team continue to assess barriers to discharge/monitor patient progress toward functional and medical goals  Care Tool:  Bathing    Body parts bathed by patient: Right arm, Left arm, Chest, Abdomen, Front perineal area, Right upper leg, Left upper leg   Body  parts bathed by helper: Buttocks, Right lower leg, Left lower leg     Bathing assist Assist Level: Minimal Assistance - Patient > 75%     Upper Body Dressing/Undressing Upper body dressing   What is the patient wearing?: Pull over shirt    Upper body assist Assist Level: Set up assist    Lower Body Dressing/Undressing Lower body dressing      What is the patient wearing?: Pants, Underwear/pull up     Lower body assist Assist for lower body dressing: Minimal Assistance - Patient > 75%     Toileting Toileting    Toileting assist Assist for toileting: Supervision/Verbal cueing     Transfers Chair/bed transfer  Transfers assist     Chair/bed transfer assist level: Minimal Assistance - Patient > 75%     Locomotion Ambulation   Ambulation assist      Assist level: Contact Guard/Touching assist Assistive device: Walker-rolling Max distance: 200'   Walk 10 feet activity   Assist     Assist level: Contact Guard/Touching assist Assistive device: Walker-rolling   Walk 50 feet activity   Assist    Assist level: Contact Guard/Touching assist Assistive device: Walker-rolling    Walk 150 feet activity   Assist Walk 150 feet activity did not occur: Safety/medical concerns  Assist level: Contact Guard/Touching assist Assistive device: Walker-rolling    Walk 10 feet on uneven surface  activity   Assist Walk 10 feet on uneven surfaces activity did not occur: Safety/medical concerns         Wheelchair     Assist Will patient use wheelchair at discharge?:  No Type of Wheelchair: Manual    Wheelchair assist level: Supervision/Verbal cueing      Wheelchair 50 feet with 2 turns activity    Assist        Assist Level: Supervision/Verbal cueing   Wheelchair 150 feet activity     Assist      Assist Level: Supervision/Verbal cueing   Blood pressure 116/79, pulse 82, temperature 99.2 F (37.3 C), resp. rate 19, height 6'  (1.829 m), weight 104.3 kg, SpO2 96 %.  Medical Problem List and Plan: 1.  Impaired mobility and ADLs s/p T9-L2 posterior spinal instrumented fusion following fall due to seizure             -patient may shower but incision site must be covered             -ELOS/Goals: 9-11 days modI in PT and OT and I in SLP             -Use thoracolumbosacral orthotic when ambulating.             Spinal precautions.  2.  Antithrombotics: -DVT/anticoagulation:  Pharmaceutical: Lovenox             -antiplatelet therapy: N/A 3. Pain Management: Now on OxyContin 10 mg bid with oxycodone prn. Consider addition of Gabapentin 300mg  TID for neuropathic pain.   3/2- will increase Oxy IR to 5-15 mg q4 hours for now- and monitor- not taking prns often.  3/3- encouraged pt to ASK for prn pain meds- hasn't been.    3/4- pain better controlled now that ASKING for prns- working much better  3/5- hurting again this AM- cannot do tramadol due to seizure dx- will increase Oxycontin to 15 mg BID 4. Mood: LCSW to follow for evaluation and support. Has history of depression.              -antipsychotic agents: N/a 5. Neuropsych: This patient is capable of making decisions on his own behalf. 6. Skin/Wound Care: Monitor wound for healing.  7. Fluids/Electrolytes/Nutrition: Monitor I/O. Check lytes in am.   3/3- labs came back late yesterday- BUN 11 and Cr 1.0- K+ 3.8 8. Seizures: History of nocturnal seizures. Breakthrough seizures---patient unable to recall home dose. Now on Keppra 1000 mg bid.  3/4- no seizure activity- con't regimen  3/5- cannot add tramadol due to seizure dx'- lowers threshold.  9. Ileus? : Abdominal distension with minimal bowel sounds. He has not had a BM since admission. Will order KUB for work up. Start miralax bid and order enema for today  3/2- had large BM after enema today- will con't to monitor BMs closely so doesn't get impacted  3/4- started Senokot 2 tabs daily- will hold off miralax/additonal  meds for now- monitor 10. Leucocytosis: Low graded fevers noted--will check UA/UCS. Monitor for signs of infection.  3/2- U/A negative for UTI- labs still pending- drawn late today.  3/3- WBC 5.7- no signs of infection currently.   11. Hyperglycemia: Elevated fasting BS--will order ac/hs cbg X 2-3 days and check Hgb A1C.   3/2- A1c is 6.7- so pt meets criteria for DM type 2- will d/w pt possible treatments- also needs to do CBGs, which has been declining intermittently.   CBG (last 3)  Recent Labs    01/14/20 1655 01/14/20 2105 01/15/20 0618  GLUCAP 138* 149* 126*   3/5- fair control- will d/w pt to start Metformin? 12.  Chronic insomnia: Due to anxiety? Has declined sleep study. Will add xanax prn  13. Peripheral axonal neuropathy: Numbness bilateral feet and hands as well as LLE.  14. Previous constipation  3/3- added senokot 2 tabs daily to stop pt from getting backed up again. Will start tomorrow.   15. High Blood Pressure- was 157/95 this AM- is normally 120s-130s over 80s- might be due to too small cuff? Will monitor trends and treat if needed.   3/5- keeps running [id number] at night systolic- but good control during day- if doesn't improve, will need to add something small.    LOS: 4 days A FACE TO FACE EVALUATION WAS PERFORMED  Benjamin [name] 01/15/2020, 10:24 AM

## 2020-01-16 ENCOUNTER — Inpatient Hospital Stay (HOSPITAL_COMMUNITY): Payer: Medicare Other | Admitting: Physical Therapy

## 2020-01-16 ENCOUNTER — Inpatient Hospital Stay (HOSPITAL_COMMUNITY): Payer: Medicare Other | Admitting: Occupational Therapy

## 2020-01-16 LAB — GLUCOSE, CAPILLARY
Glucose-Capillary: 127 mg/dL — ABNORMAL HIGH (ref 70–99)
Glucose-Capillary: 121 mg/dL — ABNORMAL HIGH (ref 70–99)
Glucose-Capillary: 139 mg/dL — ABNORMAL HIGH (ref 70–99)

## 2020-01-16 MED ORDER — NON FORMULARY: 3.0000 mg | Freq: Every evening | Status: DC | PRN

## 2020-01-16 MED ORDER — MELATONIN 3 MG PO TABS
3.0000 mg | ORAL_TABLET | Freq: Every evening | ORAL | Status: DC | PRN
  Administered 2020-01-18 – 2020-01-19 (×2): 3 mg via ORAL
  Filled 2020-01-16 (×3): qty 1

## 2020-01-16 NOTE — Progress Notes (Signed)
Physical Therapy Session Note  Patient Details  Name: Benjamin [name] MRN: [id number] Date of Birth: 05-30-1951  Today's Date: 01/16/2020 PT Individual Time: 0738-0801 PT Individual Time Calculation (min): 23 min   Short Term Goals: Week 1:  PT Short Term Goal 1 (Week 1): Pt will complete transfers at Supervision level consistently PT Short Term Goal 2 (Week 1): Pt will ambulate x 150 ft with min A and LRAD PT Short Term Goal 3 (Week 1): Pt will navigate 4 stairs with one handrail and min A  Skilled Therapeutic Interventions/Progress Updates:  Pt received in bed & agreeable to tx, reporting need to use restroom. Supine>L sidelying>sitting EOB with supervision, HOB slightly elevated, bed rails & extra time. Therapist assists with donning TLSO & pt transfers sit<>stand with CGA. Gait into bathroom with RW & CGA with decreased speed of movement. Continent void standing at toilet & pt performs hand hygiene standing at sink with CGA with 1 UE support at all times, until drying hands. Pt c/o 7-8/10 back pain - rest breaks provided PRN & pain meds requested. Assisted pt to sitting in recliner & pt reports he cannot participate 2/2 pain even with therapist offering seated BLE strengthening exercises. Instructed pt on how to doff TLSO as pt reports he's unsure of how to do it. Encouraged pt to eat breakfast and offered pt crackers prior to taking pain medication. Pt left in recliner with chair alarm donned, call bell in reach, & set up with meal tray.  Therapy Documentation Precautions:  Precautions Precautions: Back Precaution Comments: BLT percautions  Required Braces or Orthoses: Spinal Brace Spinal Brace: Thoracolumbosacral orthotic(when ambulating) Restrictions Weight Bearing Restrictions: No   General: PT Amount of Missed Time (min): 37 Minutes PT Missed Treatment Reason: Pain    Therapy/Group: Individual Therapy  Waunita Schooner 01/16/2020, 8:12 AM

## 2020-01-16 NOTE — Progress Notes (Signed)
Physical Therapy Session Note  Patient Details  Name: Benjamin [name] MRN: [id number] Date of Birth: Feb 26, 1951  Today's Date: 01/16/2020 PT Individual Time: 2119-4174 PT Individual Time Calculation (min): 58 min   Short Term Goals: Week 1:  PT Short Term Goal 1 (Week 1): Pt will complete transfers at Supervision level consistently PT Short Term Goal 2 (Week 1): Pt will ambulate x 150 ft with min A and LRAD PT Short Term Goal 3 (Week 1): Pt will navigate 4 stairs with one handrail and min A Week 2:     Skilled Therapeutic Interventions/Progress Updates:   Pt received supine in bed and agreeable to PT. Supine>sit transfer with CGA throughout lo roll. PT assisted pt don TLSO. Ambulatory transfer to toilet for urination with CGA and RW. Hand hygiene with supervision assist at sink. Pt transported to rehab gym. Gait training with RW  146f with CGA-supervision assist with min cues for increased step height on the L. PT instructed pt in seated BLE and BUE therex with level 3 tband. LAQ, hip abduction, ankle PF, ankle DF AROM, hip flexion. Mid Row, chest press/shoudler press, tricep extension. Each completed x 12 bil with cues for decreased speed and full ROM. Pt returned to room and performed ambulatory transfer to bed with RW and CGA. Sit>supine completed with min assist for LE control, and left supine in bed with call bell in reach and all needs met.         Therapy Documentation Precautions:  Precautions Precautions: Back Precaution Comments: BLT percautions  Required Braces or Orthoses: Spinal Brace Spinal Brace: Thoracolumbosacral orthotic(when ambulating) Restrictions Weight Bearing Restrictions: No Vital Signs: Therapy Vitals Temp: 99.6 F (37.6 C) Pulse Rate: 75 Resp: 16 BP: 94/62 Patient Position (if appropriate): Lying Oxygen Therapy SpO2: 95 % O2 Device: Room Air Pain: Pain Assessment Pain Scale: 0-10 Pain Score: 6  Pain Type: Chronic pain Pain Location: Back Pain  Orientation: Mid Pain Descriptors / Indicators: Aching Pain Frequency: Constant Pain Onset: On-going Pain Intervention(s): Medication (See eMAR)   Therapy/Group: Individual Therapy  ALorie Phenix3/04/2020, 5:45 PM

## 2020-01-16 NOTE — Progress Notes (Signed)
Occupational Therapy Session Note  Patient Details  Name: Benjamin [name] MRN: [id number] Date of Birth: 02/18/1951  Today's Date: 01/16/2020 OT Individual Time: [id number] OT Individual Time Calculation (min): 60 min    Short Term Goals: Week 1:  OT Short Term Goal 1 (Week 1): Pt with complete bathing with use of AE PRN Min assist OT Short Term Goal 2 (Week 1): Pt will complete toilet transfers Supervision with LRAD OT Short Term Goal 3 (Week 1): Pt will complete toileting tasks with Min assist OT Short Term Goal 4 (Week 1): Pt will maintain standing for 2 grooming tasks to demonstrate increased activity tolerance  Skilled Therapeutic Interventions/Progress Updates: Patient c/o pain upon approach for OT and stated he felt better than earlier this am and that he would try to at least use the toilet and stand at sink to wash his bottom and change his scrub pants.     Sit to stand in w/c at sink to doff pants and to stand to void (he urgently needed to go once he stood and asked to use urinal standing at sink) = cues for technique and close S  Elected not to wash upper body;     LB bathing- moderate assistance once standing dynamically.   Patient able to stand at sink and wash himself and asked for assist to dry off buttocks.  Patient stated he was to fatigued and did not want to increase his pain by standing at sink to complete oral care.   He completed it seated in w/c at sink with distant S.                   Shaved himself seated with set up sitting at sink.  Patient completed endurance activities until he stated he was too fatigued to continue and asked to ly down and completed recliner chair to bed transfer with close S  Contin ue OT POC     Therapy Documentation Precautions:  Precautions Precautions: Back Precaution Comments: BLT percautions  Required Braces or Orthoses: Spinal Brace Spinal Brace: Thoracolumbosacral orthotic(when ambulating) Restrictions Weight Bearing Restrictions:  No General: General OT Amount of Missed Time: 15 Minutes  Pain:"too much and I just medicine and I do not want to move out of this comfortable position"   He did not describe or rate pain except to state in tummy and legs     Therapy/Group: Individual Therapy  Alfredia Ferguson [facility] 01/16/2020, 2:31 PM

## 2020-01-16 NOTE — Progress Notes (Signed)
Physical Therapy Session Note  Patient Details  Name: Dragan Jaurequi MRN: [id number] Date of Birth: [birth year]  Today's Date: 01/16/2020 PT Individual Time: 1409-1450 PT Individual Time Calculation (min): 41 min   Short Term Goals: Week 1:  PT Short Term Goal 1 (Week 1): Pt will complete transfers at Supervision level consistently PT Short Term Goal 2 (Week 1): Pt will ambulate x 150 ft with min A and LRAD PT Short Term Goal 3 (Week 1): Pt will navigate 4 stairs with one handrail and min A  Skilled Therapeutic Interventions/Progress Updates:  Pt seen to make up missed time from earlier today.   Pt received in bed & agreeable to tx. Pt reports 5/10 back pain & rest breaks provided PRN. Pt rolls supine>L sidelying>sitting EOB with hospital bed features, extra time & supervision. Therapist assists with donning/doffing TLSO & educating pt & wife (who is present at end of session) of need for pt only to wear TLSO for gait. Pt transfers sit<>stand with supervision with cuing for hand placement on stable surface vs RW. Gait in room/bathroom with RW & CGA and pt with continent void standing in bathroom with CGA, as well as hand hygiene standing at sink with CGA. Gait room<>dayroom and 25 ft x 2 in dayroom with RW & CGA with pt demonstrating forward trunk lean & heavy BUE reliance on RW and elevated B shoulders. Therapist provides cuing to ambulate within base of RW, upright posture, and forward vs downward gaze with poor return demo. Pt completes sit<>stand from EOM with lowering mat height and cuing to decrease BLE reliance on EOM with focus on BLE strengthening (3 sets of 5 reps with 1 UE support & supervision overall). Attempted to use nu-step but pt unable to tolerate 2/2 back pain. Pt performs standing marches with BUE support on RW, 2 sets x 12 reps on each BLE for strengthening. At end of session pt left in recliner with chair alarm donned, call bell & all needs in reach, wife present in room.    Therapy Documentation Precautions:  Precautions Precautions: Back Precaution Comments: BLT percautions  Required Braces or Orthoses: Spinal Brace Spinal Brace: Thoracolumbosacral orthotic(when ambulating) Restrictions Weight Bearing Restrictions: No   Therapy/Group: Individual Therapy  Waunita Schooner 01/16/2020, 3:00 PM

## 2020-01-16 NOTE — Progress Notes (Signed)
[place] PHYSICAL MEDICINE & REHABILITATION PROGRESS NOTE   Subjective/Complaints: Mr. Zirk falls asleep well but then wakes up in the middle of the night and has a hard time falling back to sleep.  He is motivated to work hard in therapy as does not want to rely too much on his wife and daughter at home.    ROS: Pt denies SOB, CP, N/V/C/D  Objective:   No results found. No results for input(s): WBC, HGB, HCT, PLT in the last 72 hours. No results for input(s): NA, K, CL, CO2, GLUCOSE, BUN, CREATININE, CALCIUM in the last 72 hours. No intake or output data in the 24 hours ending 01/16/20 1504   Physical Exam: Vital Signs Blood pressure 94/62, pulse 75, temperature 99.6 F (37.6 C), resp. rate 16, height 6' (1.829 m), weight 104.3 kg, SpO2 95 %.  Physical Exam Gen: lying in bed, NAD HEENT:no facial droop; EOMI B/L Neck:supple Heart:RRR, no M/R/G Chest: CTA B/L- good air movement  Abdomen:soft, NT, ND; (+)BS- hypoactive Extremities:No clubbing, cyanosis, or edema. Pulses 2+ and B/L Skin:Psoriatic patches on bilateral knees.  Spinal incision C/D/I Neuro:alert Ox3- less fatigued than previous days.  Musculoskeletal:full ROM of extremities/joints Psych:  Still appropriate   Assessment/Plan: 1. Functional deficits secondary to Vertebral thoracic fx s/p T9-L2 fusion which require 3+ hours per day of interdisciplinary therapy in a comprehensive inpatient rehab setting.  Physiatrist is providing close team supervision and 24 hour management of active medical problems listed below.  Physiatrist and rehab team continue to assess barriers to discharge/monitor patient progress toward functional and medical goals  Care Tool:  Bathing    Body parts bathed by patient: Right arm, Left arm, Chest, Abdomen, Front perineal area, Right upper leg, Left upper leg   Body parts bathed by helper: Buttocks, Right lower leg, Left lower leg     Bathing assist Assist Level: Minimal  Assistance - Patient > 75%     Upper Body Dressing/Undressing Upper body dressing   What is the patient wearing?: Pull over shirt    Upper body assist Assist Level: Set up assist    Lower Body Dressing/Undressing Lower body dressing      What is the patient wearing?: Pants, Underwear/pull up     Lower body assist Assist for lower body dressing: Minimal Assistance - Patient > 75%     Toileting Toileting    Toileting assist Assist for toileting: Supervision/Verbal cueing     Transfers Chair/bed transfer  Transfers assist     Chair/bed transfer assist level: Contact Guard/Touching assist     Locomotion Ambulation   Ambulation assist      Assist level: Contact Guard/Touching assist Assistive device: Walker-rolling Max distance: 150 ft   Walk 10 feet activity   Assist     Assist level: Contact Guard/Touching assist Assistive device: Walker-rolling   Walk 50 feet activity   Assist    Assist level: Contact Guard/Touching assist Assistive device: Walker-rolling    Walk 150 feet activity   Assist Walk 150 feet activity did not occur: Safety/medical concerns  Assist level: Contact Guard/Touching assist Assistive device: Walker-rolling    Walk 10 feet on uneven surface  activity   Assist Walk 10 feet on uneven surfaces activity did not occur: Safety/medical concerns         Wheelchair     Assist Will patient use wheelchair at discharge?: No Type of Wheelchair: Manual    Wheelchair assist level: Supervision/Verbal cueing Max wheelchair distance: 65'  Wheelchair 50 feet with 2 turns activity    Assist        Assist Level: Supervision/Verbal cueing   Wheelchair 150 feet activity     Assist      Assist Level: Supervision/Verbal cueing   Blood pressure 94/62, pulse 75, temperature 99.6 F (37.6 C), resp. rate 16, height 6' (1.829 m), weight 104.3 kg, SpO2 95 %.  Medical Problem List and Plan: 1.  Impaired  mobility and ADLs s/p T9-L2 posterior spinal instrumented fusion following fall due to seizure             -patient may shower but incision site must be covered             -ELOS/Goals: 9-11 days modI in PT and OT and I in SLP             -Use thoracolumbosacral orthotic when ambulating.             Spinal precautions.   Continue CIR PT, OT 2.  Antithrombotics: -DVT/anticoagulation:  Pharmaceutical: Lovenox             -antiplatelet therapy: N/A 3. Pain Management: Now on OxyContin 10 mg bid with oxycodone prn. Consider addition of Gabapentin 300mg  TID for neuropathic pain.   3/2- will increase Oxy IR to 5-15 mg q4 hours for now- and monitor- not taking prns often.  3/3- encouraged pt to ASK for prn pain meds- hasn't been.    3/4- pain better controlled now that ASKING for prns- working much better  3/5- hurting again this AM- cannot do tramadol due to seizure dx- will increase Oxycontin to 15 mg BID  3/6: well controlled.  4. Mood: LCSW to follow for evaluation and support. Has history of depression.              -antipsychotic agents: N/a 5. Neuropsych: This patient is capable of making decisions on his own behalf. 6. Skin/Wound Care: Monitor wound for healing.  7. Fluids/Electrolytes/Nutrition: Monitor I/O. Check lytes in am.   3/3- labs came back late yesterday- BUN 11 and Cr 1.0- K+ 3.8 8. Seizures: History of nocturnal seizures. Breakthrough seizures---patient unable to recall home dose. Now on Keppra 1000 mg bid.  3/4- no seizure activity- con't regimen  3/5- cannot add tramadol due to seizure dx'- lowers threshold.  9. Ileus? : Abdominal distension with minimal bowel sounds. He has not had a BM since admission. Will order KUB for work up. Start miralax bid and order enema for today  3/2- had large BM after enema today- will con't to monitor BMs closely so doesn't get impacted  3/4- started Senokot 2 tabs daily- will hold off miralax/additonal meds for now- monitor 10. Leucocytosis:  Low graded fevers noted--will check UA/UCS. Monitor for signs of infection.  3/2- U/A negative for UTI- labs still pending- drawn late today.  3/3- WBC 5.7- no signs of infection currently.   11. Hyperglycemia: Elevated fasting BS--will order ac/hs cbg X 2-3 days and check Hgb A1C.   3/2- A1c is 6.7- so pt meets criteria for DM type 2- will d/w pt possible treatments- also needs to do CBGs, which has been declining intermittently.   CBG (last 3)  Recent Labs    01/15/20 1151 01/15/20 1718 01/16/20 1211  GLUCAP 120* 145* 127*   3/5- fair control- will d/w pt to start Metformin? 12.  Chronic insomnia: Due to anxiety? Has declined sleep study. Will add xanax prn  13. Peripheral axonal neuropathy: Numbness bilateral feet  and hands as well as LLE.  14. Previous constipation  3/3- added senokot 2 tabs daily to stop pt from getting backed up again. Will start tomorrow.   15. High Blood Pressure- was 157/95 this AM- is normally 120s-130s over 80s- might be due to too small cuff? Will monitor trends and treat if needed.   3/5- keeps running [id number] at night systolic- but good control during day- if doesn't improve, will need to add something small.  3/6: borderline to hypotensive this morning. Given that was elevated yesterday, continue to monitor.  16. Insomnia: Added melatonin 3mg  prn.     LOS: 5 days A FACE TO FACE EVALUATION WAS PERFORMED  Martha Clan P Eithen Castiglia 01/16/2020, 3:04 PM

## 2020-01-17 ENCOUNTER — Inpatient Hospital Stay (HOSPITAL_COMMUNITY): Payer: Medicare Other

## 2020-01-17 LAB — GLUCOSE, CAPILLARY
Glucose-Capillary: 115 mg/dL — ABNORMAL HIGH (ref 70–99)
Glucose-Capillary: 124 mg/dL — ABNORMAL HIGH (ref 70–99)
Glucose-Capillary: 117 mg/dL — ABNORMAL HIGH (ref 70–99)
Glucose-Capillary: 146 mg/dL — ABNORMAL HIGH (ref 70–99)

## 2020-01-17 MED ORDER — ACETAMINOPHEN 325 MG PO TABS
650.0000 mg | ORAL_TABLET | Freq: Three times a day (TID) | ORAL | Status: DC
  Administered 2020-01-17 – 2020-01-22 (×16): 650 mg via ORAL
  Filled 2020-01-17 (×17): qty 2

## 2020-01-17 NOTE — Progress Notes (Signed)
Occupational Therapy Session Note  Patient Details  Name: Benjamin [name] MRN: [id number] Date of Birth: 09-Feb-1951  Today's Date: 01/17/2020 OT Individual Time: 0700-0800 OT Individual Time Calculation (min): 60 min    Short Term Goals: Week 1:  OT Short Term Goal 1 (Week 1): Pt with complete bathing with use of AE PRN Min assist OT Short Term Goal 2 (Week 1): Pt will complete toilet transfers Supervision with LRAD OT Short Term Goal 3 (Week 1): Pt will complete toileting tasks with Min assist OT Short Term Goal 4 (Week 1): Pt will maintain standing for 2 grooming tasks to demonstrate increased activity tolerance  Skilled Therapeutic Interventions/Progress Updates:    Pt sitting in recliner upon arrival and getting ready to eat breakfast.  Pt agreeable to participating in therapy and eat breakfast later.  OT intervention with focus on TTB transfers, DME needs, functional amb with RW, standing balance during grooming tasks at sink, discharge planning, and safety awareness to increase independence with BADLs. Sit<>stand with supervision. Standing balance at sink with supervision. TTB tranfsers X 2 with supervision. Functional amb with RW at supervision level. Pt requires more than a reasonable amount of time to complete all tasks and frequently requires min verbal cues to redirect to task at hand. Pt returned to recliner in room.  Breakfast on table and all other needs within reach.  Belt alarm activated.   Therapy Documentation Precautions:  Precautions Precautions: Back Precaution Comments: BLT percautions  Required Braces or Orthoses: Spinal Brace Spinal Brace: Thoracolumbosacral orthotic(when ambulating) Restrictions Weight Bearing Restrictions: No Pain: Pt c/o 5/10 back pain; meds admin prior to therapy; repositioned     Therapy/Group: Individual Therapy  [name] 01/17/2020, 8:03 AM

## 2020-01-17 NOTE — Progress Notes (Signed)
Physical Therapy Session Note  Patient Details  Name: Benjamin [name] MRN: [id number] Date of Birth: 1950-12-20  Today's Date: 01/17/2020 PT Individual Time: 1402-1500 PT Individual Time Calculation (min): 58 min    Short Term Goals: Week 1:  PT Short Term Goal 1 (Week 1): Pt will complete transfers at Supervision level consistently PT Short Term Goal 2 (Week 1): Pt will ambulate x 150 ft with min A and LRAD PT Short Term Goal 3 (Week 1): Pt will navigate 4 stairs with one handrail and min A  Skilled Therapeutic Interventions/Progress Updates:    Pt seated in recliner upon PT arrival, agreeable to therapy tx and reports pain 7/10 in patient back, repositioned and limited activity during session for pain relief. t performed sit>stand with RW and min assist, ambulated from recliner<>bathroom with RW and CGA, pt continent of bladder and maintained standing balance with supervision while toileting. Pt ambulated to the gym x 200 ft with RW and CGA, cues for upright posture and step length. Transferred to the mat. From elevated mat, pt performed x 10 sit<>stands from mat without AD, pushing up with hands on knees and cues for techniques, working on LE strengthening and emphasis on increased hip/knee extension in standing. Therapist performed manual Hamstring stretching 2 x 30 sec bilaterally for ROM. Pt standing without AD performed X 5 mini squats -  however unable to do more secondary to low back pain. Pt worked on dynamic standing balance this session without UE support while shooting basketball x 2 trials with close supervision. Pt ambulated back to room x 200 ft CGA, with x 2 standing rest breaks during. Pt transferred to the recliner, sit<>stands from recliner with min assist and RW in order to change briefs. Pt using reacher to doffer soiled brief, and then don clean briefs/pants with supervision and increased time. Pt left in recliner with needs in reach and chair alarm set, wife present.    Therapy  Documentation Precautions:  Precautions Precautions: Back Precaution Comments: BLT percautions  Required Braces or Orthoses: Spinal Brace Spinal Brace: Thoracolumbosacral orthotic(when ambulating) Restrictions Weight Bearing Restrictions: No    Therapy/Group: Individual Therapy  Netta Corrigan, PT, DPT, CSRS 01/17/2020, 2:26 PM

## 2020-01-17 NOTE — Progress Notes (Signed)
[place] PHYSICAL MEDICINE & REHABILITATION PROGRESS NOTE   Subjective/Complaints: Benjamin [name] says last night he awoke in the middle of the night and tried to get out of bed as if in dream, staff helped him back to bed.  In more pain today.   ROS: Pt denies SOB, CP, N/V/C/D  Objective:   No results found. No results for input(s): WBC, HGB, HCT, PLT in the last 72 hours. No results for input(s): NA, K, CL, CO2, GLUCOSE, BUN, CREATININE, CALCIUM in the last 72 hours.  Intake/Output Summary (Last 24 hours) at 01/17/2020 1158 Last data filed at 01/17/2020 0730 Gross per 24 hour  Intake 582 ml  Output --  Net 582 ml     Physical Exam: Vital Signs Blood pressure 129/79, pulse 84, temperature 97.8 F (36.6 C), resp. rate 18, height 6' (1.829 m), weight 104.3 kg, SpO2 98 %.  Physical Exam Gen: lying in bed, NAD HEENT:no facial droop; EOMI B/L Neck:supple Heart:RRR, no M/R/G Chest: CTA B/L- good air movement  Abdomen:soft, NT, ND; (+)BS- hypoactive Extremities:No clubbing, cyanosis, or edema. Pulses 2+ and B/L Skin:Psoriatic patches on bilateral knees.  Spinal incision C/D/I Neuro:alert Ox3- less fatigued than previous days.  Musculoskeletal:full ROM of extremities/joints. Ambulating well RW S Psych:  Still appropriate  Assessment/Plan: 1. Functional deficits secondary to Vertebral thoracic fx s/p T9-L2 fusion which require 3+ hours per day of interdisciplinary therapy in a comprehensive inpatient rehab setting.  Physiatrist is providing close team supervision and 24 hour management of active medical problems listed below.  Physiatrist and rehab team continue to assess barriers to discharge/monitor patient progress toward functional and medical goals  Care Tool:  Bathing    Body parts bathed by patient: Right arm, Left arm, Chest, Abdomen, Front perineal area, Right upper leg, Left upper leg   Body parts bathed by helper: Buttocks, Right lower leg, Left lower leg      Bathing assist Assist Level: Minimal Assistance - Patient > 75%     Upper Body Dressing/Undressing Upper body dressing   What is the patient wearing?: Pull over shirt    Upper body assist Assist Level: Set up assist    Lower Body Dressing/Undressing Lower body dressing      What is the patient wearing?: Pants, Underwear/pull up     Lower body assist Assist for lower body dressing: Minimal Assistance - Patient > 75%     Toileting Toileting    Toileting assist Assist for toileting: Supervision/Verbal cueing     Transfers Chair/bed transfer  Transfers assist     Chair/bed transfer assist level: Contact Guard/Touching assist     Locomotion Ambulation   Ambulation assist      Assist level: Contact Guard/Touching assist Assistive device: Walker-rolling Max distance: 150 ft   Walk 10 feet activity   Assist     Assist level: Contact Guard/Touching assist Assistive device: Walker-rolling   Walk 50 feet activity   Assist    Assist level: Contact Guard/Touching assist Assistive device: Walker-rolling    Walk 150 feet activity   Assist Walk 150 feet activity did not occur: Safety/medical concerns  Assist level: Contact Guard/Touching assist Assistive device: Walker-rolling    Walk 10 feet on uneven surface  activity   Assist Walk 10 feet on uneven surfaces activity did not occur: Safety/medical concerns         Wheelchair     Assist Will patient use wheelchair at discharge?: No Type of Wheelchair: Manual    Wheelchair assist level:  Supervision/Verbal cueing Max wheelchair distance: 88'    Wheelchair 50 feet with 2 turns activity    Assist        Assist Level: Supervision/Verbal cueing   Wheelchair 150 feet activity     Assist      Assist Level: Supervision/Verbal cueing   Blood pressure 129/79, pulse 84, temperature 97.8 F (36.6 C), resp. rate 18, height 6' (1.829 m), weight 104.3 kg, SpO2 98  %.  Medical Problem List and Plan: 1.  Impaired mobility and ADLs s/p T9-L2 posterior spinal instrumented fusion following fall due to seizure             -patient may shower but incision site must be covered             -ELOS/Goals: 9-11 days modI in PT and OT and I in SLP             -Use thoracolumbosacral orthotic when ambulating.             Spinal precautions.   Continue CIR PT, OT 2.  Antithrombotics: -DVT/anticoagulation:  Pharmaceutical: Lovenox             -antiplatelet therapy: N/A 3. Pain Management: Now on OxyContin 10 mg bid with oxycodone prn. Consider addition of Gabapentin 300mg  TID for neuropathic pain.   3/2- will increase Oxy IR to 5-15 mg q4 hours for now- and monitor- not taking prns often.  3/3- encouraged pt to ASK for prn pain meds- hasn't been.    3/4- pain better controlled now that ASKING for prns- working much better  3/5- hurting again this AM- cannot do tramadol due to seizure dx- will increase Oxycontin to 15 mg BID  3/6: well controlled.   3/7: in pain today. Schedule Tylenol to 650 q8 for better pain control.  4. Mood: LCSW to follow for evaluation and support. Has history of depression.              -antipsychotic agents: N/a 5. Neuropsych: This patient is capable of making decisions on his own behalf. 6. Skin/Wound Care: Monitor wound for healing.  7. Fluids/Electrolytes/Nutrition: Monitor I/O. Check lytes in am.   3/3- labs came back late yesterday- BUN 11 and Cr 1.0- K+ 3.8 8. Seizures: History of nocturnal seizures. Breakthrough seizures---patient unable to recall home dose. Now on Keppra 1000 mg bid.  3/4- no seizure activity- con't regimen  3/5- cannot add tramadol due to seizure dx'- lowers threshold.  9. Ileus? : Abdominal distension with minimal bowel sounds. He has not had a BM since admission. Will order KUB for work up. Start miralax bid and order enema for today  3/2- had large BM after enema today- will con't to monitor BMs closely so  doesn't get impacted  3/4- started Senokot 2 tabs daily- will hold off miralax/additonal meds for now- monitor 10. Leucocytosis: Low graded fevers noted--will check UA/UCS. Monitor for signs of infection.  3/2- U/A negative for UTI- labs still pending- drawn late today.  3/3- WBC 5.7- no signs of infection currently.   11. Hyperglycemia: Elevated fasting BS--will order ac/hs cbg X 2-3 days and check Hgb A1C.   3/2- A1c is 6.7- so pt meets criteria for DM type 2- will d/w pt possible treatments- also needs to do CBGs, which has been declining intermittently.   CBG (last 3)  Recent Labs    01/16/20 2129 01/17/20 0605 01/17/20 1150  GLUCAP 139* 115* 124*   3/5- fair control- will d/w pt to start  Metformin?  3/7: well controlled 12.  Chronic insomnia: Due to anxiety? Has declined sleep study. Will add xanax prn  13. Peripheral axonal neuropathy: Numbness bilateral feet and hands as well as LLE.  14. Previous constipation  3/3- added senokot 2 tabs daily to stop pt from getting backed up again. Will start tomorrow.   15. High Blood Pressure- was 157/95 this AM- is normally 120s-130s over 80s- might be due to too small cuff? Will monitor trends and treat if needed.   3/5- keeps running [id number] at night systolic- but good control during day- if doesn't improve, will need to add something small.  3/6: borderline to hypotensive this morning. Given that was elevated yesterday, continue to monitor.  16. Insomnia: Added melatonin 3mg  prn.  Sleeping better   LOS: 6 days A FACE TO FACE EVALUATION WAS PERFORMED  Benjamin [name] 01/17/2020, 11:58 AM

## 2020-01-18 ENCOUNTER — Inpatient Hospital Stay (HOSPITAL_COMMUNITY): Payer: Medicare Other | Admitting: Physical Therapy

## 2020-01-18 ENCOUNTER — Encounter (HOSPITAL_COMMUNITY): Payer: Medicare Other | Admitting: Psychology

## 2020-01-18 ENCOUNTER — Inpatient Hospital Stay (HOSPITAL_COMMUNITY): Payer: Medicare Other

## 2020-01-18 LAB — BASIC METABOLIC PANEL
Anion gap: 8 (ref 5–15)
BUN: 10 mg/dL (ref 8–23)
CO2: 27 mmol/L (ref 22–32)
Calcium: 8.4 mg/dL — ABNORMAL LOW (ref 8.9–10.3)
Chloride: 101 mmol/L (ref 98–111)
Creatinine, Ser: 0.97 mg/dL (ref 0.61–1.24)
GFR calc Af Amer: >60 mL/min (ref >60)
GFR calc non Af Amer: >60 mL/min (ref >60)
Glucose, Bld: 123 mg/dL — ABNORMAL HIGH (ref 70–99)
Potassium: 4.3 mmol/L (ref 3.5–5.1)
Sodium: 136 mmol/L (ref 135–145)

## 2020-01-18 LAB — CBC
HCT: 36 % — ABNORMAL LOW (ref 39.0–52.0)
Hemoglobin: 11.9 g/dL — ABNORMAL LOW (ref 13.0–17.0)
MCH: 30.4 pg (ref 26.0–34.0)
MCHC: 33.1 g/dL (ref 30.0–36.0)
MCV: 92.1 fL (ref 80.0–100.0)
Platelets: 310 10*3/uL (ref 150–400)
RBC: 3.91 MIL/uL — ABNORMAL LOW (ref 4.22–5.81)
RDW: 13.1 % (ref 11.5–15.5)
WBC: 7.4 10*3/uL (ref 4.0–10.5)
nRBC: 0 % (ref 0.0–0.2)

## 2020-01-18 LAB — GLUCOSE, CAPILLARY
Glucose-Capillary: 141 mg/dL — ABNORMAL HIGH (ref 70–99)
Glucose-Capillary: 117 mg/dL — ABNORMAL HIGH (ref 70–99)
Glucose-Capillary: 110 mg/dL — ABNORMAL HIGH (ref 70–99)
Glucose-Capillary: 112 mg/dL — ABNORMAL HIGH (ref 70–99)
Glucose-Capillary: 96 mg/dL (ref 70–99)
Glucose-Capillary: 153 mg/dL — ABNORMAL HIGH (ref 70–99)

## 2020-01-18 NOTE — Progress Notes (Signed)
Physical Therapy Weekly Progress Note  Patient Details  Name: Benjamin [name] MRN: [id number] Date of Birth: 12/20/1950  Beginning of progress report period: January 12, 2020 End of progress report period: January 18, 2020  Today's Date: 01/18/2020 PT Individual Time: 0900-0940; 1400-1430 PT Individual Time Calculation (min): 40 min and 30 min PT Missed Time: 20 minutes Missed Time Reason: pain  Patient has met 2 of 3 short term goals.  Pt is at Supervision level for bed mobility, Supervision to CGA for transfers with RW, gait up to 200 ft with Supervision, and min A for stair navigation. Pt is making good progress towards LTG but does remain limited by pain at times.  Patient continues to demonstrate the following deficits muscle weakness, decreased cardiorespiratoy endurance and decreased standing balance, decreased postural control and decreased balance strategies and therefore will continue to benefit from skilled PT intervention to increase functional independence with mobility.  Patient progressing toward long term goals..  Continue plan of care.  PT Short Term Goals Week 1:  PT Short Term Goal 1 (Week 1): Pt will complete transfers at Supervision level consistently PT Short Term Goal 1 - Progress (Week 1): Progressing toward goal PT Short Term Goal 2 (Week 1): Pt will ambulate x 150 ft with min A and LRAD PT Short Term Goal 2 - Progress (Week 1): Met PT Short Term Goal 3 (Week 1): Pt will navigate 4 stairs with one handrail and min A PT Short Term Goal 3 - Progress (Week 1): Met Week 2:  PT Short Term Goal 1 (Week 2): =LTG due to ELOS  Skilled Therapeutic Interventions/Progress Updates:    Session 1: Pt received seated in recliner in room, agreeable to PT session. Pt reports 6/10 pain in back at rest and that he is premedicated prior to start of therapy session. Pt reports pain is an ongoing limiting factor and that he has trouble finding a comfortable position. Sit to stand with CGA to  RW. Ambulation x 100 ft with RW at Supervision level, v/c for upright stance as pt tends to ambulate with flexed trunk and downward gaze. Pt reports onset of pain is too great during ambulation and needs to stop after 100 ft. Discussed pt's home setup and environment. Per pt he has a small threshold to navigate after to ascends his front stairs in order to enter the home. Pt is able to navigate a simulated threshold with RW and min A for balance. Pt also reports he does not have much space to maneuver once he enters the home and questions if RW will fit in his foyer. Provided pt with home measurement sheet for his family to complete as well as noted for his family to take pictures of his foyer to better assess safest way to navigate this. Pt reports no other concerns with regards to RW fitting through functional spaces in his home. Sit to supine at Supervision level on flat bed with use of bedrail. Pt exhibits poor tolerance for supine positioning and has increase in back pain in this position. Pt has decrease in pain with HOB elevation. Provided pt with handout for where to purchase bedrail if he chooses as this increases his independence with bed mobility. Pt reports back pain remains at 6/10 once positioned in bed as comfortably as possible. Pt declines any heat or ice for pain management. Pt left semi-reclined in bed with needs in reach, bed alarm in place. Pt missed 20 min of scheduled therapy session due to pain.  Session 2: Pt received seated in w/c, appears to be feeling better than in AM session. Pt does not complain of pain or report pain during session. Manual w/c propulsion x 150 ft with use of BUE at Supervision level for global endurance training. Sit to stand with Supervision to RW throughout session. Stand pivot transfer w/c to mat table with RW and Supervision. Adjusted pt's TLSO while seated EOM for improved comfort as it has been causing him pain due to tightness. Pt reports improvement in pain  with brace following adjustments. Ambulation 2 x 100 ft with RW at Supervision level with focus on upright stance and decreased B shoulder elevation. Pt left seated in recliner in room with needs in reach at end of session.  Therapy Documentation Precautions:  Precautions Precautions: Back Precaution Comments: BLT percautions  Required Braces or Orthoses: Spinal Brace Spinal Brace: Thoracolumbosacral orthotic(when ambulating) Restrictions Weight Bearing Restrictions: No General: PT Amount of Missed Time (min): 20 Minutes PT Missed Treatment Reason: Pain      Therapy/Group: Individual Therapy   Excell Seltzer, PT, DPT 01/18/2020, 9:46 AM

## 2020-01-18 NOTE — Progress Notes (Signed)
Occupational Therapy Session Note  Patient Details  Name: Rameen Alpers MRN: [id number] Date of Birth: Jun 16, 1951  Today's Date: 01/18/2020 OT Individual Time: 0700-0810 OT Individual Time Calculation (min): 70 min    Short Term Goals: Week 2:  OT Short Term Goal 1 (Week 2): STG=LTG secondary to ELOS  Skilled Therapeutic Interventions/Progress Updates:    Pt walking out of bathroom upon arrival with RN present.  Pt stood at sink to wash hands before returning to seat.  Pt agreeable to bathing at shower level and changing clothes this morning.  OT intervention with focus on functional amb with RW, standing balance, BADLs using AE PRN, safety awareness, and activity tolerance to increase independence with BADLs.  See Care Tool for assist levels. Pt uses AE appropriately to assist with threading pants and donning socks. Pt assists with donning TLSO and provides appropriate instructions for donning TLSO. Pt amb with RW to Day Room and returned to room, requiring rest break X1. Pt remained in recliner with belt alarm activated and all needs within reach.   Therapy Documentation Precautions:  Precautions Precautions: Back Precaution Comments: BLT percautions  Required Braces or Orthoses: Spinal Brace Spinal Brace: Thoracolumbosacral orthotic(when ambulating) Restrictions Weight Bearing Restrictions: No  Pain: Pt c/o 3/10 back pain at beginning of session and 6/10 back pain following activity; repositioned  Therapy/Group: Individual Therapy  [name] 01/18/2020, 8:15 AM

## 2020-01-18 NOTE — Plan of Care (Signed)
  Problem: Consults Goal: RH SPINAL CORD INJURY PATIENT EDUCATION Description:  See Patient Education module for education specifics.  Outcome: Progressing Goal: Skin Care Protocol Initiated - if Braden Score 18 or less Description: If consults are not indicated, leave blank or document N/A Outcome: Progressing Goal: Nutrition Consult-if indicated Outcome: Progressing Goal: Diabetes Guidelines if Diabetic/Glucose > 140 Description: If diabetic or lab glucose is > 140 mg/dl - Initiate Diabetes/Hyperglycemia Guidelines & Document Interventions  Outcome: Progressing   Problem: SCI BOWEL ELIMINATION Goal: RH STG MANAGE BOWEL WITH ASSISTANCE Description: STG Manage Bowel with mod I Assistance. Outcome: Progressing Goal: RH STG SCI MANAGE BOWEL WITH MEDICATION WITH ASSISTANCE Description: STG SCI Manage bowel with medication with mod I assistance. Outcome: Progressing Goal: RH STG MANAGE BOWEL W/EQUIPMENT W/ASSISTANCE Description: STG Manage Bowel With Equipment With Assistance Outcome: Progressing Goal: RH STG SCI MANAGE BOWEL PROGRAM W/ASSIST OR AS APPROPRIATE Description: STG SCI Manage bowel program w/assist or as appropriate. Outcome: Progressing Goal: RH OTHER STG BOWEL ELIMINATION GOALS W/ASSIST Description: Other STG Bowel Elimination Goals With Assistance. Outcome: Progressing   Problem: SCI BLADDER ELIMINATION Goal: RH STG MANAGE BLADDER WITH ASSISTANCE Description: STG Manage Bladder With mod I Assistance Outcome: Progressing Goal: RH STG MANAGE BLADDER WITH MEDICATION WITH ASSISTANCE Description: STG Manage Bladder With Medication With mod I Assistance. Outcome: Progressing Goal: RH STG MANAGE BLADDER WITH EQUIPMENT WITH ASSISTANCE Description: STG Manage Bladder With Equipment With Assistance Outcome: Progressing Goal: RH STG SCI MANAGE BLADDER PROGRAM W/ASSISTANCE Outcome: Progressing Goal: RH OTHER STG BLADDER ELIMINATION GOALS W/ASSIST Description: Other STG  Bladder Elimination Goals With Assistance Outcome: Progressing   Problem: RH SKIN INTEGRITY Goal: RH STG SKIN FREE OF INFECTION/BREAKDOWN Outcome: Progressing Goal: RH STG MAINTAIN SKIN INTEGRITY WITH ASSISTANCE Description: STG Maintain Skin Integrity With mod I Assistance. Outcome: Progressing Goal: RH STG ABLE TO PERFORM INCISION/WOUND CARE W/ASSISTANCE Description: STG Able To Perform Incision/Wound Care With Assistance. Outcome: Progressing   Problem: RH SAFETY Goal: RH STG ADHERE TO SAFETY PRECAUTIONS W/ASSISTANCE/DEVICE Description: STG Adhere to Safety Precautions With Assistance/Device. Outcome: Progressing Goal: RH STG DECREASED RISK OF FALL WITH ASSISTANCE Description: STG Decreased Risk of Fall With mod I Assistance. Outcome: Progressing   Problem: RH PAIN MANAGEMENT Goal: RH STG PAIN MANAGED AT OR BELOW PT'S PAIN GOAL Description: Pain scale less than 5/10 Outcome: Progressing   Problem: RH KNOWLEDGE DEFICIT SCI Goal: RH STG INCREASE KNOWLEDGE OF SELF CARE AFTER SCI Outcome: Progressing

## 2020-01-18 NOTE — Progress Notes (Signed)
Occupational Therapy Weekly Progress Note  Patient Details  Name: Benjamin [name] MRN: [id number] Date of Birth: 08/25/1951  Beginning of progress report period: January 12, 2020 End of progress report period: January 18, 2020  Patient has met 3 of 3 short term goals.  Pt is making steady progress with BADLs and functional transfers.  Pt requires max A for donning TLSO.  Pt requires assistance bathing buttocks but completes all other bathing tasks without assistance using AE appropriately. Pt has demonstrated appropriate use of AE to assist in dressing tasks. Pt is CGA/supervision for all functional transfers.  Pt requires more than a reasonable amount of time to complete tasks with multiple rest breaks, primarily for pain management.  Patient continues to demonstrate the following deficits: muscle weakness and acute pain, decreased cardiorespiratoy endurance and decreased standing balance, decreased balance strategies and difficulty maintaining precautions and therefore will continue to benefit from skilled OT intervention to enhance overall performance with BADL and Reduce care partner burden.  Patient progressing toward long term goals..  Continue plan of care.  OT Short Term Goals Week 1:  OT Short Term Goal 1 (Week 1): Pt with complete bathing with use of AE PRN Min assist OT Short Term Goal 1 - Progress (Week 1): Met OT Short Term Goal 2 (Week 1): Pt will complete toilet transfers Supervision with LRAD OT Short Term Goal 2 - Progress (Week 1): Met OT Short Term Goal 3 (Week 1): Pt will complete toileting tasks with Min assist OT Short Term Goal 3 - Progress (Week 1): Met OT Short Term Goal 4 (Week 1): Pt will maintain standing for 2 grooming tasks to demonstrate increased activity tolerance Week 2:  OT Short Term Goal 1 (Week 2): STG=LTG secondary to ELOS      Lanier, Thomas Chappell 01/18/2020, 6:13 AM   

## 2020-01-18 NOTE — Progress Notes (Signed)
[facility] PHYSICAL MEDICINE & REHABILITATION PROGRESS NOTE   Subjective/Complaints:  Pt reports hurting more- can't get comfortable- asked CNA to help him- for 10 minutes she attempted to get him comfortable- was unable to- not sure when pain meds due- will have pt call for pain meds.   LBM yesterday- still voiding well.      ROS: Pt denies SOB, CP, Abd pain, N/V/C/D  Objective:   No results found. Recent Labs    01/18/20 0550  WBC 7.4  HGB 11.9*  HCT 36.0*  PLT 310   Recent Labs    01/18/20 0550  NA 136  K 4.3  CL 101  CO2 27  GLUCOSE 123*  BUN 10  CREATININE 0.97  CALCIUM 8.4*    Intake/Output Summary (Last 24 hours) at 01/18/2020 1054 Last data filed at 01/18/2020 0852 Gross per 24 hour  Intake 680 ml  Output --  Net 680 ml     Physical Exam: Vital Signs Blood pressure 129/83, pulse 74, temperature 98.2 F (36.8 C), temperature source Oral, resp. rate 17, height 6' (1.829 m), weight 104.3 kg, SpO2 97 %.  Physical Exam Gen: sitting up in bedside chair- trying to get comfortable, NAD HEENT: conjugate gaze Neck:supple Heart:RRR; no JVD Chest: CTA B/L- no accessory muscle use.  Abdomen: soft, NT, ND, (+)BS hypoactive Extremities:No clubbing, cyanosis, or edema. Pulses 2+ and B/L Skin:Psoriatic patches on bilateral knees.  Spinal incision C/D/I Neuro:alert Ox3- less fatigued than previous days.  Musculoskeletal:full ROM of extremities/joints. Ambulating well RW S Psych:  Appropriate,e but has safety belt around waist? Was up 2 nights ago trying to get OOB in dream per notes.   Assessment/Plan: 1. Functional deficits secondary to Vertebral thoracic fx s/p T9-L2 fusion which require 3+ hours per day of interdisciplinary therapy in a comprehensive inpatient rehab setting.  Physiatrist is providing close team supervision and 24 hour management of active medical problems listed below.  Physiatrist and rehab team continue to assess barriers to  discharge/monitor patient progress toward functional and medical goals  Care Tool:  Bathing    Body parts bathed by patient: Right arm, Left arm, Chest, Abdomen, Front perineal area, Right upper leg, Left upper leg, Buttocks, Right lower leg, Left lower leg   Body parts bathed by helper: Buttocks, Right lower leg, Left lower leg     Bathing assist Assist Level: Supervision/Verbal cueing     Upper Body Dressing/Undressing Upper body dressing   What is the patient wearing?: Pull over shirt    Upper body assist Assist Level: Set up assist    Lower Body Dressing/Undressing Lower body dressing      What is the patient wearing?: Pants, Underwear/pull up     Lower body assist Assist for lower body dressing: Supervision/Verbal cueing     Toileting Toileting    Toileting assist Assist for toileting: Supervision/Verbal cueing     Transfers Chair/bed transfer  Transfers assist     Chair/bed transfer assist level: Supervision/Verbal cueing     Locomotion Ambulation   Ambulation assist      Assist level: Supervision/Verbal cueing Assistive device: Walker-rolling Max distance: 100'   Walk 10 feet activity   Assist     Assist level: Supervision/Verbal cueing Assistive device: Walker-rolling   Walk 50 feet activity   Assist    Assist level: Supervision/Verbal cueing Assistive device: Walker-rolling    Walk 150 feet activity   Assist Walk 150 feet activity did not occur: Safety/medical concerns  Assist level: Contact Guard/Touching  assist Assistive device: Walker-rolling    Walk 10 feet on uneven surface  activity   Assist Walk 10 feet on uneven surfaces activity did not occur: Safety/medical concerns         Wheelchair     Assist Will patient use wheelchair at discharge?: No Type of Wheelchair: Manual    Wheelchair assist level: Supervision/Verbal cueing Max wheelchair distance: 17'    Wheelchair 50 feet with 2 turns  activity    Assist        Assist Level: Supervision/Verbal cueing   Wheelchair 150 feet activity     Assist      Assist Level: Supervision/Verbal cueing   Blood pressure 129/83, pulse 74, temperature 98.2 F (36.8 C), temperature source Oral, resp. rate 17, height 6' (1.829 m), weight 104.3 kg, SpO2 97 %.  Medical Problem List and Plan: 1.  Impaired mobility and ADLs s/p T9-L2 posterior spinal instrumented fusion following fall due to seizure             -patient may shower but incision site must be covered             -ELOS/Goals: 9-11 days modI in PT and OT and I in SLP             -Use thoracolumbosacral orthotic when ambulating.             Spinal precautions.   Continue CIR PT, OT 2.  Antithrombotics: -DVT/anticoagulation:  Pharmaceutical: Lovenox             -antiplatelet therapy: N/A 3. Pain Management: Now on OxyContin 10 mg bid with oxycodone prn. Consider addition of Gabapentin 300mg  TID for neuropathic pain.   3/2- will increase Oxy IR to 5-15 mg q4 hours for now- and monitor- not taking prns often.  3/3- encouraged pt to ASK for prn pain meds- hasn't been.    3/4- pain better controlled now that ASKING for prns- working much better  3/5- hurting again this AM- cannot do tramadol due to seizure dx- will increase Oxycontin to 15 mg BID  3/6: well controlled.   3/7: in pain today. Schedule Tylenol to 650 q8 for better pain control.   3/8- pt has gotten a lot of increases to pain meds, in last week- will wait to increase for now.  4. Mood: LCSW to follow for evaluation and support. Has history of depression.              -antipsychotic agents: N/a 5. Neuropsych: This patient is capable of making decisions on his own behalf. 6. Skin/Wound Care: Monitor wound for healing.  7. Fluids/Electrolytes/Nutrition: Monitor I/O. Check lytes in am.   3/3- labs came back late yesterday- BUN 11 and Cr 1.0- K+ 3.8  3/8- BUN 10 and Cr 0.97- K+ 3.8- con't PO fluids. 8.  Seizures: History of nocturnal seizures. Breakthrough seizures---patient unable to recall home dose. Now on Keppra 1000 mg bid.  3/4- no seizure activity- con't regimen  3/5- cannot add tramadol due to seizure dx'- lowers threshold.  9. Ileus? : Abdominal distension with minimal bowel sounds. He has not had a BM since admission. Will order KUB for work up. Start miralax bid and order enema for today  3/2- had large BM after enema today- will con't to monitor BMs closely so doesn't get impacted  3/4- started Senokot 2 tabs daily- will hold off miralax/additonal meds for now- monitor  3/8- going more regularly- almost daily. 10. Leucocytosis: Low graded fevers noted--will check  UA/UCS. Monitor for signs of infection.  3/2- U/A negative for UTI- labs still pending- drawn late today.  3/3- WBC 5.7- no signs of infection currently.   11. Hyperglycemia: Elevated fasting BS--will order ac/hs cbg X 2-3 days and check Hgb A1C.   3/2- A1c is 6.7- so pt meets criteria for DM type 2- will d/w pt possible treatments- also needs to do CBGs, which has been declining intermittently.   CBG (last 3)  Recent Labs    01/17/20 1626 01/17/20 2116 01/18/20 0617  GLUCAP 117* 146* 110*   3/5- fair control- will d/w pt to start Metformin?  3/7: well controlled  3/8- BGs better since stopped drinking so many cokes with every meal- will hold off on Metformin for now and monitor.  12.  Chronic insomnia: Due to anxiety? Has declined sleep study. Will add xanax prn  13. Peripheral axonal neuropathy: Numbness bilateral feet and hands as well as LLE.  14. Previous constipation  3/3- added senokot 2 tabs daily to stop pt from getting backed up again. Will start tomorrow.   15. High Blood Pressure- was 157/95 this AM- is normally 120s-130s over 80s- might be due to too small cuff? Will monitor trends and treat if needed.   3/5- keeps running [id number] at night systolic- but good control during day- if doesn't improve,  will need to add something small.  3/6: borderline to hypotensive this morning. Given that was elevated yesterday, continue to monitor.   3/8- BP much improved- no Hypertension in last 48 hours.  16. Insomnia: Added melatonin 3mg  prn.  Sleeping better   LOS: 7 days A FACE TO FACE EVALUATION WAS PERFORMED  [place] 01/18/2020, 10:54 AM
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
## 2020-01-18 NOTE — Progress Notes (Signed)
Physical Therapy Session Note  Patient Details  Name: Benjamin [name] MRN: [id number] Date of Birth: 09/23/1951  Today's Date: 01/18/2020 PT Individual Time: 1110-1158 PT Individual Time Calculation (min): 48 min   Short Term Goals: Week 1:  PT Short Term Goal 1 (Week 1): Pt will complete transfers at Supervision level consistently PT Short Term Goal 2 (Week 1): Pt will ambulate x 150 ft with min A and LRAD PT Short Term Goal 3 (Week 1): Pt will navigate 4 stairs with one handrail and min A  Skilled Therapeutic Interventions/Progress Updates:    Handoff from NT from bathroom with pt performing functional mobility with RW in room and hand hygiene at sink at supervision level with RW for support. Pt reports he believes the TLSO was too tight and increased his back pain. PT adjusted TLSO to fit a little looser while ambulating to see if this would help increase tolerance to brace. Pt unable to locate a specific area of increased pain, just that it was "too tight". Functional gait training on unit with RW with overall supervision to occasional CGA with tactile and verbal cues for upright posture as pt tendency for kyphotic posture and hunched over RW. Educated on importance of posture and how the flexed posture can also increase back pain. Balance re-training and standing tolerance while on foam wedge for compliant surface and heel cord stretching x 3 reps x ~ 1 min each trial and then progressed to performing functional reaching task with 1 UE support and CGA due decreased balance strategies and increased back pain. Seated rest breaks needed between trials. Pt requires CGA at times for sit <> stands with cues for technique and hand placement. End of session set up in w/c with all needs in reach. Discussed progress, goals and d/c planning. Pt expresses concerns in regards to having another seizure and what he would do if by himself.   Therapy Documentation Precautions:  Precautions Precautions:  Back Precaution Comments: BLT percautions  Required Braces or Orthoses: Spinal Brace Spinal Brace: Thoracolumbosacral orthotic(when ambulating) Restrictions Weight Bearing Restrictions: No    Pain: Pain Assessment Pain Scale: 0-10 Pain Score: 6  Pain Type: Acute pain Pain Location: Back Pain Orientation: Mid  Reports it has improved since laying down. Also adjusted tightness of TLSO and this appears to help somewhat.    Therapy/Group: Individual Therapy  Canary Brim Ivory Broad, PT, DPT, CBIS  01/18/2020, 12:10 PM

## 2020-01-19 ENCOUNTER — Inpatient Hospital Stay (HOSPITAL_COMMUNITY): Payer: Medicare Other | Admitting: Physical Therapy

## 2020-01-19 ENCOUNTER — Inpatient Hospital Stay (HOSPITAL_COMMUNITY): Payer: Medicare Other

## 2020-01-19 ENCOUNTER — Inpatient Hospital Stay (HOSPITAL_COMMUNITY): Payer: Medicare Other | Admitting: Occupational Therapy

## 2020-01-19 LAB — GLUCOSE, CAPILLARY
Glucose-Capillary: 109 mg/dL — ABNORMAL HIGH (ref 70–99)
Glucose-Capillary: 97 mg/dL (ref 70–99)
Glucose-Capillary: 141 mg/dL — ABNORMAL HIGH (ref 70–99)
Glucose-Capillary: 121 mg/dL — ABNORMAL HIGH (ref 70–99)

## 2020-01-19 NOTE — Progress Notes (Signed)
Occupational Therapy Session Note  Patient Details  Name: Benjamin [name] MRN: [id number] Date of Birth: 11-16-1950  Today's Date: 01/19/2020 OT Individual Time: 1300-1327 OT Individual Time Calculation (min): 27 min    Short Term Goals: Week 2:  OT Short Term Goal 1 (Week 2): STG=LTG secondary to ELOS  Skilled Therapeutic Interventions/Progress Updates:    Pt resting in w/c upon arrival.  Pt states he is no pain while seated.  Pain increased "a little" with ambulation. OT intervention with focus on sit<>stand, standing balance, functional amb with RW, discharge planning, and safety awareness to increase independence with BADLs and prepare for discharge home.  Sit<>stand with supervision, functional amb with RW (room to Day Room and return with rest break) with supervision. No LOB noted. Discussed home safety and made recommendation to remove area rugs/carpets to reduce tripping hazzards.  Pt verbalized understanding but states he's not sure his wife will agree.  Pt returned to room and remained seated in recliner.  All needs within reach and seat alarm activated.   Therapy Documentation Precautions:  Precautions Precautions: Back Precaution Comments: BLT percautions  Required Braces or Orthoses: Spinal Brace Spinal Brace: Thoracolumbosacral orthotic(when ambulating) Restrictions Weight Bearing Restrictions: No   Pain: Pt stated he was without pain while seated but it increase "a little" when ambulating; repositioned   Therapy/Group: Individual Therapy  [name] 01/19/2020, 1:28 PM

## 2020-01-19 NOTE — Progress Notes (Signed)
Occupational Therapy Session Note  Patient Details  Name: Benjamin [name] MRN: [id number] Date of Birth: 07/31/1951  Today's Date: 01/19/2020 OT Individual Time: 0700-0810 OT Individual Time Calculation (min): 70 min    Short Term Goals: Week 2:  OT Short Term Goal 1 (Week 2): STG=LTG secondary to ELOS  Skilled Therapeutic Interventions/Progress Updates:    Pt resting in recliner upon arrival and agreeable to bathing at shower level before changing clothing.  OT intervention with focus on sit<>stand, standing balance, functional amb with RW, BADL retraining, activity tolerance, and safety awareness to increase independence with BADLs and prepare for discharge home. See Care Tool for assist levels. CGA for amb with RW.  LOB X 1 when standing in shower but used grab bar to self correct.  Pt able to thread pants without use of reacher this morning. Pt required assistance to thread sock on L foot. Pt amb with RW to day room and rested before returning to room.  Pt noted increased pain with ambulation-TLSO in place. Pt returned to recliner.  Seat alarm activated and all needs within reach.   Therapy Documentation Precautions:  Precautions Precautions: Back Precaution Comments: BLT percautions  Required Braces or Orthoses: Spinal Brace Spinal Brace: Thoracolumbosacral orthotic(when ambulating) Restrictions Weight Bearing Restrictions: No Pain: Pt c/o 5/10 back pain with activity; premedicated and repositioned   Therapy/Group: Individual Therapy  [name] 01/19/2020, 8:11 AM

## 2020-01-19 NOTE — Progress Notes (Signed)
Occupational Therapy Session Note  Patient Details  Name: Melven Mitrani MRN: [id number] Date of Birth: [birth year]  Today's Date: 01/19/2020 OT Individual Time: [id number] OT Individual Time Calculation (min): 45 min     Skilled Therapeutic Interventions/Progress Updates:    Pt completed donning of TLSO in sitting to start session with setup assist.  He then completed toilet transfer with use of the RW and close supervision.  He was able to stand at the toilet with use of the RW to urinate.  He next came out to the sink where he was able to stand and wash his hands at supervision level as well.  After a brief rest break, he ambulated to the dayroom where he engaged in sit to stand and standing while working with the Wii activity.  He was able to complete sit to stand with supervision as well as maintain dynamic standing balance at the same level.  He was however only able to tolerate standing for intervals of less than 2 mins secondary to increased pain.  He reports having a pain pill about one hour before session.  Finished with ambulation back to the room at supervision level.  Discussed tub setup at home and likely need for a tub bench based on difficulty stepping over.  He reports practicing this in other OT sessions as well.  Pt left in the recliner with call button and phone in reach with safety belt in place.    Therapy Documentation Precautions:  Precautions Precautions: Back Precaution Comments: BLT percautions  Required Braces or Orthoses: Spinal Brace Spinal Brace: Thoracolumbosacral orthotic Restrictions Weight Bearing Restrictions: No  Pain: Pain Assessment Pain Scale: 0-10 Pain Score: 6  Pain Type: Acute pain Pain Location: Back Pain Orientation: Mid Pain Descriptors / Indicators: Discomfort Pain Frequency: Constant Pain Onset: With Activity Patients Stated Pain Goal: 2 Pain Intervention(s): Repositioned;Emotional support Multiple Pain Sites: No ADL: See Care Tool  Section for some details of toileting and mobility  Therapy/Group: Individual Therapy  Kateland Leisinger OTR/L 01/19/2020, 4:26 PM

## 2020-01-19 NOTE — Plan of Care (Signed)
  RD consulted for nutrition education regarding diabetes.   **RD working remotely[employer] Value Date   HGBA1C 6.7 (H) 01/12/2020   RD consulted to provide education regarding diabetes diagnosis. Per MD, pt is noted to have poor memory and it was recommended education be given to pt's wife. RD attempted to call pt's wife; wife did not answer and voicemail was full. RD attached education to discharge instructions.   RD provided "Carbohydrate Counting for People with Diabetes" handout from the Academy of Nutrition and Dietetics. Handout discusses different food groups and their effects on blood sugar, emphasizing carbohydrate-containing foods. Provided list of carbohydrates and recommended serving sizes of common foods. Handout also discusses importance of controlled and consistent carbohydrate intake throughout the day and provides examples of ways to balance meals/snacks.  Expect fair compliance.  Body mass index is 31.19 kg/m. Pt meets criteria for obesity based on current BMI.  Current diet order is regular, patient is consuming approximately 100% of meals at this time.   Medications reviewed and include: SSI, Miralax, Senokot  Labs reviewed. CBGs 96-153   No further nutrition interventions warranted at this time. RD contact information provided. If additional nutrition issues arise, please re-consult RD.  Larkin Ina, MS, RD, LDN RD pager number and weekend/on-call pager number located in [place].

## 2020-01-19 NOTE — Patient Care Conference (Signed)
Inpatient RehabilitationTeam Conference and Plan of Care Update Date: 01/19/2020   Time: 11:05 AM   Patient Name: Benjamin [name]      Medical Record Number: [id number]  Date of Birth: Sep 17, 1951 Sex: Male         Room/Bed: 4W09C/4W09C-01 Payor Info: Payor: [employer] / Plan: Electrical engineer / Product Type: *No Product type* /    Admit Date/Time:  01/11/2020  5:46 PM  Primary Diagnosis:  Thoracic vertebral fracture [facility])  Patient Active Problem List   Diagnosis Date Noted  . Diabetes ([place]) 01/12/2020  . Abdominal distension   . Thoracic vertebral fracture ([place]) 01/11/2020  . Closed T11 spinal fracture ([place])   . Psychophysiological insomnia 08/18/2018  . Psoriatic arthritis ([place]) 08/18/2018  . Partial symptomatic epilepsy with complex partial seizures, not intractable, without status epilepticus ([place]) 08/18/2018  . Subjective memory complaints 08/15/2017  . Seizures ([place]) 08/15/2017  . Peripheral axonal neuropathy 08/06/2014  . Unspecified hereditary and idiopathic peripheral neuropathy 07/07/2014  . Lumbar stenosis with neurogenic claudication 12/23/2013  . Other forms of epilepsy and recurrent seizures without mention of intractable epilepsy 09/16/2013  . Prostate cancer ([place]) 07/03/2012    Expected Discharge Date: Expected Discharge Date: 01/22/20  Team Members Present: Physician leading conference: Dr. Courtney Heys Care Coodinator Present: Karene Fry, RN, [place], [place] Nurse Present: Vernie Murders, RN PT Present: Excell Seltzer, PT OT Present: Willeen Cass, OT;Roanna Epley, COTA SLP Present: Weston Anna, SLP PPS Coordinator present : Ileana Ladd, Burna Mortimer, SLP     Current Status/Progress Goal Weekly Team Focus  Bowel/Bladder   Pt continent of B/B, LBM 3/6  Remain continent  Assess toileting q shift and prn   Swallow/Nutrition/ Hydration             ADL's   bathing at shower level-CGA; LB dressing-CGA; min A donning TLSO; functional  transers-CGA/supervision  Mod I overall, Supervision shower transfer  ADL retraining, activity tolerance, safety awareness, functional tranfsers   Mobility   Supervision to CGA with mobility with RW, gait up to 200 ft with RW Supervision, min A stairs with 1 handrail  Supervision to mod I overall  transfers, gait, stairs, pain management   Communication             Safety/Cognition/ Behavioral Observations            Pain   Pt c/o back pain, has scheduled and PRNs  Pain less than 5  Assess pain q shift and prn   Skin   Back incision, Clean/Dry, OTA  Prevent skin breakdown  Assess skin q shift and prn    Rehab Goals Patient on target to meet rehab goals: Yes *See Care Plan and progress notes for long and short-term goals.     Barriers to Discharge  Current Status/Progress Possible Resolutions Date Resolved   Nursing                  PT  Decreased caregiver support                 OT                  SLP                SW                Discharge Planning/Teaching Needs:  Pt will d/c to home with intermittent support. Wife works and will periodically check on pt throughout the day. Pt wife states  their adult dtr lives in the home and may be able to assist. Reports there are neighbors who will provide PRN assistance as well.  Family education as recommended by therapy   Team Discussion: Memory issues, has DM now, pain management issues, BS okay, nutrition has seen, monitoring BP.  RN BS 109, cont, urgency, BM 3/6, oxy for pain.  PT S overall, CGA stairs, goals mod I, S goals stairs, decreased memory.  OT mod I goals, S shower goals, S/CGA overall, perseverates, anxious about going home.  Has a wife.   Revisions to Treatment Plan: N/A     Medical Summary Current Status: getting ocverage of BGsl urinary urgency; LBM 3/6; skin glued for back; Weekly Focus/Goal: PT_ supervisont o CGA for PT- on track for mod I for goals; memory issues noted. Perseverated on DM this AM  Barriers  to Discharge: Behavior;Other (comments);New diabetic;Home enviroment access/layout;Medical stability;Weight;Weight bearing restrictions;Wound care  Barriers to Discharge Comments: orientation? Possible Resolutions to Barriers: OT- mofd I goals- supervision to CGA right now- perseverates as well with OT_ anxious- focused on pain.   Continued Need for Acute Rehabilitation Level of Care: The patient requires daily medical management by a physician with specialized training in physical medicine and rehabilitation for the following reasons: Direction of a multidisciplinary physical rehabilitation program to maximize functional independence : Yes Medical management of patient stability for increased activity during participation in an intensive rehabilitation regime.: Yes Analysis of laboratory values and/or radiology reports with any subsequent need for medication adjustment and/or medical intervention. : Yes   I attest that I was present, lead the team conference, and concur with the assessment and plan of the team.   Retta Diones 01/19/2020, 8:29 PM   Team conference was held via web/ teleconference due to [place] - 19

## 2020-01-19 NOTE — Progress Notes (Signed)
Physical Therapy Session Note  Patient Details  Name: Benjamin [name] MRN: [id number] Date of Birth: Aug 30, 1951  Today's Date: 01/19/2020 PT Individual Time: [id number] PT Individual Time Calculation (min): 60 min   Short Term Goals: Week 2:  PT Short Term Goal 1 (Week 2): =LTG due to ELOS  Skilled Therapeutic Interventions/Progress Updates:    Pt received seated in recliner in room, agreeable to PT session. Pt somewhat emotional and upset and reports he just found out he has DM. Per MD she discussed pt's diagnosis of DM last week with him and pt with poor memory of conversation. Pt perseverative on DM diagnosis throughout session, provided emotional support and redirected pt to therapy tasks. Pt reports 5/10 pain in back at rest, declines any intervention. Sit to stand with Supervision to RW throughout session with cues for safe hand placement. Ambulation x 150 ft with RW at Supervision level, cues for upright stance and decreased B shoulder elevation. Ascend/descend 4 stairs laterally with L handrail at CGA level, v/c for safe stair navigation. Pt reports increase in back pain while performing stairs, not rated. Ambulation through obstacle course with RW at Supervision to [facility] level weaving in/out of cones and stepping over obstacles to simulate tight spaces in the home. Pt reports he feels that he will be "just fine" upon d/c home and that he will "just figure it out" with regards to navigating his environment safely. Pt also reports he plans on ambulating short distances in the home without his RW. Trial gait with no AD 2 x 30 ft with min A. Pt has increase in pain with gait with no AD as well as requires increased assist for safety. Education with pt that he will be safer using RW for ambulation in the home upon d/c, pt in agreement. Pt reports urge to urinate. Standing balance with RW and distant Supervision while urinating. Standing balance at sink with Supervision while pt washes his hands. Pt left  seated in recliner in room with needs in reach, chair alarm in place at end of session.  Therapy Documentation Precautions:  Precautions Precautions: Back Precaution Comments: BLT percautions  Required Braces or Orthoses: Spinal Brace Spinal Brace: Thoracolumbosacral orthotic(when ambulating) Restrictions Weight Bearing Restrictions: No    Therapy/Group: Individual Therapy   Excell Seltzer, PT, DPT  01/19/2020, 12:40 PM

## 2020-01-19 NOTE — Plan of Care (Signed)
  Problem: Consults Goal: RH SPINAL CORD INJURY PATIENT EDUCATION Description:  See Patient Education module for education specifics.  Outcome: Progressing Goal: Skin Care Protocol Initiated - if Braden Score 18 or less Description: If consults are not indicated, leave blank or document N/A Outcome: Progressing Goal: Nutrition Consult-if indicated Outcome: Progressing Goal: Diabetes Guidelines if Diabetic/Glucose > 140 Description: If diabetic or lab glucose is > 140 mg/dl - Initiate Diabetes/Hyperglycemia Guidelines & Document Interventions  Outcome: Progressing   Problem: SCI BOWEL ELIMINATION Goal: RH STG MANAGE BOWEL WITH ASSISTANCE Description: STG Manage Bowel with mod I Assistance. Outcome: Progressing Goal: RH STG SCI MANAGE BOWEL WITH MEDICATION WITH ASSISTANCE Description: STG SCI Manage bowel with medication with mod I assistance. Outcome: Progressing Goal: RH STG MANAGE BOWEL W/EQUIPMENT W/ASSISTANCE Description: STG Manage Bowel With Equipment With Assistance Outcome: Progressing Goal: RH STG SCI MANAGE BOWEL PROGRAM W/ASSIST OR AS APPROPRIATE Description: STG SCI Manage bowel program w/assist or as appropriate. Outcome: Progressing Goal: RH OTHER STG BOWEL ELIMINATION GOALS W/ASSIST Description: Other STG Bowel Elimination Goals With Assistance. Outcome: Progressing   Problem: SCI BLADDER ELIMINATION Goal: RH STG MANAGE BLADDER WITH ASSISTANCE Description: STG Manage Bladder With mod I Assistance Outcome: Progressing Goal: RH STG MANAGE BLADDER WITH MEDICATION WITH ASSISTANCE Description: STG Manage Bladder With Medication With mod I Assistance. Outcome: Progressing Goal: RH STG MANAGE BLADDER WITH EQUIPMENT WITH ASSISTANCE Description: STG Manage Bladder With Equipment With Assistance Outcome: Progressing Goal: RH STG SCI MANAGE BLADDER PROGRAM W/ASSISTANCE Outcome: Progressing Goal: RH OTHER STG BLADDER ELIMINATION GOALS W/ASSIST Description: Other STG  Bladder Elimination Goals With Assistance Outcome: Progressing   Problem: RH SKIN INTEGRITY Goal: RH STG SKIN FREE OF INFECTION/BREAKDOWN Outcome: Progressing Goal: RH STG MAINTAIN SKIN INTEGRITY WITH ASSISTANCE Description: STG Maintain Skin Integrity With mod I Assistance. Outcome: Progressing Goal: RH STG ABLE TO PERFORM INCISION/WOUND CARE W/ASSISTANCE Description: STG Able To Perform Incision/Wound Care With Assistance. Outcome: Progressing   Problem: RH SAFETY Goal: RH STG ADHERE TO SAFETY PRECAUTIONS W/ASSISTANCE/DEVICE Description: STG Adhere to Safety Precautions With Assistance/Device. Outcome: Progressing Goal: RH STG DECREASED RISK OF FALL WITH ASSISTANCE Description: STG Decreased Risk of Fall With mod I Assistance. Outcome: Progressing   Problem: RH PAIN MANAGEMENT Goal: RH STG PAIN MANAGED AT OR BELOW PT'S PAIN GOAL Description: Pain scale less than 5/10 Outcome: Progressing   Problem: RH KNOWLEDGE DEFICIT SCI Goal: RH STG INCREASE KNOWLEDGE OF SELF CARE AFTER SCI Outcome: Progressing

## 2020-01-19 NOTE — Progress Notes (Signed)
Social Work Patient ID: Benjamin [name], male   DOB: 11/24/1950, 69 y.o.   MRN: [id number]    SW called pt wife [name] [phone]) to provide updates from team conference and d/c date 01/22/2020. SW provided updates on DME recs: RW, BSC, and TTB with HHPT/OT/?ST. SW to leave HHA list in room.  SW met with pt in room and left HHA list.   Loralee Pacas, MSW, [place] Office: [phone] Cell: [phone] Fax: [phone]

## 2020-01-19 NOTE — Progress Notes (Signed)
[place] PHYSICAL MEDICINE & REHABILITATION PROGRESS NOTE   Subjective/Complaints:  Pt reports still hurting a lot and can't get comfortable this AM- although we've already discussed how to take the short acting and long acting medicine, we went over again. Also reinforced needs to stop drinking real coke with meals- it's causing his BGs to get elevated- we also discussed this Friday but pt forgot- doesn't remember the conversation at all- and doesn't remember we discussed he has DM now- newly Dx'd. Will get dietician to come do educational teaching on dx.     ROS: Pt denies Sob, ABd pain, CP, N/V/C/D  Objective:   No results found. Recent Labs    01/18/20 0550  WBC 7.4  HGB 11.9*  HCT 36.0*  PLT 310   Recent Labs    01/18/20 0550  NA 136  K 4.3  CL 101  CO2 27  GLUCOSE 123*  BUN 10  CREATININE 0.97  CALCIUM 8.4*    Intake/Output Summary (Last 24 hours) at 01/19/2020 1018 Last data filed at 01/19/2020 0836 Gross per 24 hour  Intake 820 ml  Output 200 ml  Net 620 ml     Physical Exam: Vital Signs Blood pressure 132/83, pulse 85, temperature 97.6 F (36.4 C), temperature source Oral, resp. rate 19, height 6' (1.829 m), weight 104.3 kg, SpO2 99 %.  Physical Exam Gen: sitting up in bedside chair; still c/o not getting comfortable; on own, NAD; poor memory! HEENT: conjugate gaze Neck:supple Heart:CTA B/L- good air movement Abdomen: soft, NT< ND, (+)BS hypoactive Extremities:No clubbing, cyanosis, or edema. Pulses 2+ and B/L Skin:Psoriatic patches on bilateral knees.  Spinal incision C/D/I Neuro:alert Ox3- less fatigued than previous days.  Musculoskeletal:full ROM of extremities/joints. LEs 1-2+ LE edema in feet and ankles only.  Psych:  Appropriate, not confused; but POOR memory.   Assessment/Plan: 1. Functional deficits secondary to Vertebral thoracic fx s/p T9-L2 fusion which require 3+ hours per day of interdisciplinary therapy in a comprehensive  inpatient rehab setting.  Physiatrist is providing close team supervision and 24 hour management of active medical problems listed below.  Physiatrist and rehab team continue to assess barriers to discharge/monitor patient progress toward functional and medical goals  Care Tool:  Bathing    Body parts bathed by patient: Right arm, Left arm, Chest, Abdomen, Front perineal area, Right upper leg, Left upper leg, Buttocks, Right lower leg, Left lower leg, Face   Body parts bathed by helper: Buttocks, Right lower leg, Left lower leg     Bathing assist Assist Level: Supervision/Verbal cueing     Upper Body Dressing/Undressing Upper body dressing   What is the patient wearing?: Pull over shirt, Orthosis    Upper body assist Assist Level: Supervision/Verbal cueing    Lower Body Dressing/Undressing Lower body dressing      What is the patient wearing?: Pants, Underwear/pull up     Lower body assist Assist for lower body dressing: Supervision/Verbal cueing     Toileting Toileting    Toileting assist Assist for toileting: Supervision/Verbal cueing     Transfers Chair/bed transfer  Transfers assist     Chair/bed transfer assist level: Supervision/Verbal cueing     Locomotion Ambulation   Ambulation assist      Assist level: Supervision/Verbal cueing Assistive device: Walker-rolling Max distance: 150'   Walk 10 feet activity   Assist     Assist level: Supervision/Verbal cueing Assistive device: Walker-rolling   Walk 50 feet activity   Assist    Assist  level: Supervision/Verbal cueing Assistive device: Walker-rolling    Walk 150 feet activity   Assist Walk 150 feet activity did not occur: Safety/medical concerns  Assist level: Contact Guard/Touching assist Assistive device: Walker-rolling    Walk 10 feet on uneven surface  activity   Assist Walk 10 feet on uneven surfaces activity did not occur: Safety/medical concerns          Wheelchair     Assist Will patient use wheelchair at discharge?: No Type of Wheelchair: Manual    Wheelchair assist level: Supervision/Verbal cueing Max wheelchair distance: 73'    Wheelchair 50 feet with 2 turns activity    Assist        Assist Level: Supervision/Verbal cueing   Wheelchair 150 feet activity     Assist      Assist Level: Supervision/Verbal cueing   Blood pressure 132/83, pulse 85, temperature 97.6 F (36.4 C), temperature source Oral, resp. rate 19, height 6' (1.829 m), weight 104.3 kg, SpO2 99 %.  Medical Problem List and Plan: 1.  Impaired mobility and ADLs s/p T9-L2 posterior spinal instrumented fusion following fall due to seizure             -patient may shower but incision site must be covered             -ELOS/Goals: 9-11 days modI in PT and OT and I in SLP             -Use thoracolumbosacral orthotic when ambulating.             Spinal precautions.   Continue CIR PT, OT 2.  Antithrombotics: -DVT/anticoagulation:  Pharmaceutical: Lovenox             -antiplatelet therapy: N/A 3. Pain Management: Now on OxyContin 10 mg bid with oxycodone prn. Consider addition of Gabapentin 300mg  TID for neuropathic pain.   3/2- will increase Oxy IR to 5-15 mg q4 hours for now- and monitor- not taking prns often.  3/3- encouraged pt to ASK for prn pain meds- hasn't been.    3/4- pain better controlled now that ASKING for prns- working much better  3/5- hurting again this AM- cannot do tramadol due to seizure dx- will increase Oxycontin to 15 mg BID  3/6: well controlled.   3/7: in pain today. Schedule Tylenol to 650 q8 for better pain control.   3/8- pt has gotten a lot of increases to pain meds, in last week- will wait to increase for now.  3/9- pt and I discussed- he says he's hurting and admits he doesn't remember when to ask- also, doesn't want to get addicted to meds- agreed not to increase dosages today- also went over how will be decreased when  goes home.   4. Mood: LCSW to follow for evaluation and support. Has history of depression.              -antipsychotic agents: N/a 5. Neuropsych: This patient is capable of making decisions on his own behalf. 6. Skin/Wound Care: Monitor wound for healing.  7. Fluids/Electrolytes/Nutrition: Monitor I/O. Check lytes in am.   3/3- labs came back late yesterday- BUN 11 and Cr 1.0- K+ 3.8  3/8- BUN 10 and Cr 0.97- K+ 3.8- con't PO fluids. 8. Seizures: History of nocturnal seizures. Breakthrough seizures---patient unable to recall home dose. Now on Keppra 1000 mg bid.  3/4- no seizure activity- con't regimen  3/5- cannot add tramadol due to seizure dx'- lowers threshold.   3/9- pt notes cannot  have diet drinks due ot increased risk of seizures 9. Ileus? : Abdominal distension with minimal bowel sounds. He has not had a BM since admission. Will order KUB for work up. Start miralax bid and order enema for today  3/2- had large BM after enema today- will con't to monitor BMs closely so doesn't get impacted  3/4- started Senokot 2 tabs daily- will hold off miralax/additonal meds for now- monitor  3/8- going more regularly- almost daily. 10. Leucocytosis: Low graded fevers noted--will check UA/UCS. Monitor for signs of infection.  3/2- U/A negative for UTI- labs still pending- drawn late today.  3/3- WBC 5.7- no signs of infection currently.   11. Hyperglycemia: Elevated fasting BS--will order ac/hs cbg X 2-3 days and check Hgb A1C.   3/2- A1c is 6.7- so pt meets criteria for DM type 2- will d/w pt possible treatments- also needs to do CBGs, which has been declining intermittently.   CBG (last 3)  Recent Labs    01/18/20 1656 01/18/20 2120 01/19/20 0614  GLUCAP 96 153* 109*   3/5- fair control- will d/w pt to start Metformin?  3/7: well controlled  3/8- BGs better since stopped drinking so many cokes with every meal- will hold off on Metformin for now and monitor.   3/9- pt still drinking  cokes- will get Dietary education on newly Dx'd DM- also, will wait to start metformin if changes diet- diet is main cause base don drinking soda with every meal.  12.  Chronic insomnia: Due to anxiety? Has declined sleep study. Will add xanax prn  13. Peripheral axonal neuropathy: Numbness bilateral feet and hands as well as LLE.  14. Previous constipation  3/3- added senokot 2 tabs daily to stop pt from getting backed up again. Will start tomorrow.   15. High Blood Pressure- was 157/95 this AM- is normally 120s-130s over 80s- might be due to too small cuff? Will monitor trends and treat if needed.   3/5- keeps running [id number] at night systolic- but good control during day- if doesn't improve, will need to add something small.  3/6: borderline to hypotensive this morning. Given that was elevated yesterday, continue to monitor.   3/8- BP much improved- no Hypertension in last 48 hours.   3/9- BP much improved- con't no meds 16. Insomnia: Added melatonin 3mg  prn.  Sleeping better   LOS: 8 days A FACE TO FACE EVALUATION WAS PERFORMED  Sherrey North 01/19/2020, 10:18 AM

## 2020-01-20 ENCOUNTER — Inpatient Hospital Stay (HOSPITAL_COMMUNITY): Payer: Medicare Other

## 2020-01-20 ENCOUNTER — Other Ambulatory Visit: Payer: Self-pay

## 2020-01-20 DIAGNOSIS — E119 Type 2 diabetes mellitus without complications: Secondary | ICD-10-CM

## 2020-01-20 LAB — GLUCOSE, CAPILLARY
Glucose-Capillary: 110 mg/dL — ABNORMAL HIGH (ref 70–99)
Glucose-Capillary: 112 mg/dL — ABNORMAL HIGH (ref 70–99)
Glucose-Capillary: 120 mg/dL — ABNORMAL HIGH (ref 70–99)
Glucose-Capillary: 124 mg/dL — ABNORMAL HIGH (ref 70–99)

## 2020-01-20 MED ORDER — ACETAMINOPHEN 325 MG PO TABS: 650.0000 mg | ORAL_TABLET | Freq: Three times a day (TID) | ORAL | Status: DC

## 2020-01-20 MED ORDER — OXYCODONE HCL ER 10 MG PO T12A
10.0000 mg | EXTENDED_RELEASE_TABLET | Freq: Two times a day (BID) | ORAL | Status: DC
  Administered 2020-01-20 – 2020-01-22 (×4): 10 mg via ORAL
  Filled 2020-01-20 (×4): qty 1

## 2020-01-20 MED ORDER — ALPRAZOLAM 1 MG PO TABS: ORAL_TABLET | ORAL | 0 refills | Status: DC

## 2020-01-20 MED ORDER — MELATONIN 3 MG PO TABS: 3.0000 mg | ORAL_TABLET | Freq: Every evening | ORAL | 0 refills | Status: DC | PRN

## 2020-01-20 NOTE — Progress Notes (Signed)
Patient c/o pain this morning in mid lower back with pain level of 8. PRN Oxycodone 15 mg was given. Nurse reminded patient last bowel movement and asked if patient want to take miralax or suppository for constipation. Patient refused to take any constipation medication this rotation but patient wants to try to use the bathroom this morning after breakfast and if no BM output patient will take miralax. Karlene Einstein Durell Lofaso, LPN

## 2020-01-20 NOTE — Progress Notes (Signed)
PHYSICAL MEDICINE & REHABILITATION PROGRESS NOTE   Subjective/Complaints:  Pt reports that he doesn't have memory issues- he was emphatic that wasn't an issue and said that he just "ignored you when you told me I had DM- I don't know you or believe you".  Also said he didn't believe wife had concerns about his memory and he would "talk to her".   Spoke to wife on phone for 25 minutes today after speaking to pt for 15 minutes today-  Wife emphatic pt was paranoid and somewhat confused earlier in week and that she won't take pt home on pain meds- I explained since he's on a high dose it will take a couple of weeks to wean him off pain meds- she agreed but wanted pain meds reduced. Pt also admitted meds might be playing a part in his issues, and agreed-   ROS: Pt denies Sob, ABd pain, CP, N/V/C/D  Objective:   No results found. Recent Labs    01/18/20 0550  WBC 7.4  HGB 11.9*  HCT 36.0*  PLT 310   Recent Labs    01/18/20 0550  NA 136  K 4.3  CL 101  CO2 27  GLUCOSE 123*  BUN 10  CREATININE 0.97  CALCIUM 8.4*    Intake/Output Summary (Last 24 hours) at 01/20/2020 1738 Last data filed at 01/20/2020 [id number] Gross per 24 hour  Intake 540 ml  Output --  Net 540 ml     Physical Exam: Vital Signs Blood pressure 115/84, pulse 79, temperature 98.2 F (36.8 C), temperature source Oral, resp. rate (!) 21, height 6' (1.829 m), weight 100.4 kg, SpO2 95 %.  Physical Exam Gen: sitting in bedside chair; very defensive about poor memory, NAD HEENT: conjugate gaze Neck:supple Heart: RRR Pulm: CTA B/L Abdomen: soft, NT, ND, (+)BS hypoactive Extremities:No clubbing, cyanosis, or edema. Pulses 2+ and B/L Skin:Psoriatic patches on bilateral knees.  Spinal incision C/D/I Neuro:Ox3 but poor memory noted Musculoskeletal:full ROM of extremities/joints. LEs 1-2+ LE edema in feet and ankles only.  Psych:  Very defensive and angry when confronted about issues with memory.  Says "ignoring people".   Assessment/Plan: 1. Functional deficits secondary to Vertebral thoracic fx s/p T9-L2 fusion which require 3+ hours per day of interdisciplinary therapy in a comprehensive inpatient rehab setting.  Physiatrist is providing close team supervision and 24 hour management of active medical problems listed below.  Physiatrist and rehab team continue to assess barriers to discharge/monitor patient progress toward functional and medical goals  Care Tool:  Bathing    Body parts bathed by patient: Right arm, Left arm, Chest, Abdomen, Front perineal area, Right upper leg, Left upper leg, Buttocks, Right lower leg, Left lower leg, Face   Body parts bathed by helper: Buttocks, Right lower leg, Left lower leg     Bathing assist Assist Level: Supervision/Verbal cueing     Upper Body Dressing/Undressing Upper body dressing   What is the patient wearing?: Pull over shirt, Orthosis    Upper body assist Assist Level: Supervision/Verbal cueing    Lower Body Dressing/Undressing Lower body dressing      What is the patient wearing?: Pants, Underwear/pull up     Lower body assist Assist for lower body dressing: Supervision/Verbal cueing     Toileting Toileting    Toileting assist Assist for toileting: Supervision/Verbal cueing     Transfers Chair/bed transfer  Transfers assist     Chair/bed transfer assist level: Supervision/Verbal cueing     Locomotion Ambulation  Ambulation assist      Assist level: Supervision/Verbal cueing Assistive device: Walker-rolling Max distance: 200   Walk 10 feet activity   Assist     Assist level: Supervision/Verbal cueing Assistive device: Walker-rolling   Walk 50 feet activity   Assist    Assist level: Supervision/Verbal cueing Assistive device: Walker-rolling    Walk 150 feet activity   Assist Walk 150 feet activity did not occur: Safety/medical concerns  Assist level: Supervision/Verbal  cueing Assistive device: Walker-rolling    Walk 10 feet on uneven surface  activity   Assist Walk 10 feet on uneven surfaces activity did not occur: Safety/medical concerns   Assist level: Supervision/Verbal cueing Assistive device: Aeronautical engineer Will patient use wheelchair at discharge?: No Type of Wheelchair: Manual    Wheelchair assist level: Supervision/Verbal cueing Max wheelchair distance: 69'    Wheelchair 50 feet with 2 turns activity    Assist        Assist Level: Supervision/Verbal cueing   Wheelchair 150 feet activity     Assist      Assist Level: Supervision/Verbal cueing   Blood pressure 115/84, pulse 79, temperature 98.2 F (36.8 C), temperature source Oral, resp. rate (!) 21, height 6' (1.829 m), weight 100.4 kg, SpO2 95 %.  Medical Problem List and Plan: 1.  Impaired mobility and ADLs s/p T9-L2 posterior spinal instrumented fusion following fall due to seizure             -patient may shower but incision site must be covered             -ELOS/Goals: 9-11 days modI in PT and OT and I in SLP             -Use thoracolumbosacral orthotic when ambulating.             Spinal precautions.   Continue CIR PT, OT 2.  Antithrombotics: -DVT/anticoagulation:  Pharmaceutical: Lovenox             -antiplatelet therapy: N/A 3. Pain Management: Now on OxyContin 10 mg bid with oxycodone prn. Consider addition of Gabapentin 300mg  TID for neuropathic pain.   3/2- will increase Oxy IR to 5-15 mg q4 hours for now- and monitor- not taking prns often.  3/3- encouraged pt to ASK for prn pain meds- hasn't been.    3/4- pain better controlled now that ASKING for prns- working much better  3/5- hurting again this AM- cannot do tramadol due to seizure dx- will increase Oxycontin to 15 mg BID  3/6: well controlled.   3/7: in pain today. Schedule Tylenol to 650 q8 for better pain control.   3/8- pt has gotten a lot of increases to pain  meds, in last week- will wait to increase for now.  3/9- pt and I discussed- he says he's hurting and admits he doesn't remember when to ask- also, doesn't want to get addicted to meds- agreed not to increase dosages today- also went over how will be decreased when goes home.   4. Mood: LCSW to follow for evaluation and support. Has history of depression.              -antipsychotic agents: N/a 5. Neuropsych: This patient is capable of making decisions on his own behalf. 6. Skin/Wound Care: Monitor wound for healing.  7. Fluids/Electrolytes/Nutrition: Monitor I/O. Check lytes in am.   3/3- labs came back late yesterday- BUN 11 and Cr 1.0- K+ 3.8  3/8-  BUN 10 and Cr 0.97- K+ 3.8- con't PO fluids. 8. Seizures: History of nocturnal seizures. Breakthrough seizures---patient unable to recall home dose. Now on Keppra 1000 mg bid.  3/4- no seizure activity- con't regimen  3/5- cannot add tramadol due to seizure dx'- lowers threshold.   3/9- pt notes cannot have diet drinks due ot increased risk of seizures 9. Ileus? : Abdominal distension with minimal bowel sounds. He has not had a BM since admission. Will order KUB for work up. Start miralax bid and order enema for today  3/2- had large BM after enema today- will con't to monitor BMs closely so doesn't get impacted  3/4- started Senokot 2 tabs daily- will hold off miralax/additonal meds for now- monitor  3/8- going more regularly- almost daily. 10. Leucocytosis: Low graded fevers noted--will check UA/UCS. Monitor for signs of infection.  3/2- U/A negative for UTI- labs still pending- drawn late today.  3/3- WBC 5.7- no signs of infection currently.   11. Hyperglycemia: Elevated fasting BS--will order ac/hs cbg X 2-3 days and check Hgb A1C.   3/2- A1c is 6.7- so pt meets criteria for DM type 2- will d/w pt possible treatments- also needs to do CBGs, which has been declining intermittently.   CBG (last 3)  Recent Labs    01/20/20 0622  01/20/20 1151 01/20/20 1617  GLUCAP 110* 112* 120*   3/5- fair control- will d/w pt to start Metformin?  3/7: well controlled  3/8- BGs better since stopped drinking so many cokes with every meal- will hold off on Metformin for now and monitor.   3/9- pt still drinking cokes- will get Dietary education on newly Dx'd DM- also, will wait to start metformin if changes diet- diet is main cause base don drinking soda with every meal.   3/10- pt said doesn't want meds- ignoring [place] about BGs- willing to talk to PCP-  12.  Chronic insomnia: Due to anxiety? Has declined sleep study. Will add xanax prn  13. Peripheral axonal neuropathy: Numbness bilateral feet and hands as well as LLE.  14. Previous constipation  3/3- added senokot 2 tabs daily to stop pt from getting backed up again. Will start tomorrow.   15. High Blood Pressure- was 157/95 this AM- is normally 120s-130s over 80s- might be due to too small cuff? Will monitor trends and treat if needed.   3/5- keeps running [id number] at night systolic- but good control during day- if doesn't improve, will need to add something small.  3/6: borderline to hypotensive this morning. Given that was elevated yesterday, continue to monitor.   3/8- BP much improved- no Hypertension in last 48 hours.   3/9- BP much improved- con't no meds 16. Insomnia: Added melatonin 3mg  prn.  Sleeping better 17. Poor memory- likely due to pain meds- will reduce Oxycontin to 10 mg BID and reduce oxycodone to 5-10 mg q4 hours and get rid of Oxy 15 mg fast acting- wife insisted "won't take him home" confused.   I spent 25 minutes on phone with wife; 15 minutes with pt and 10 minutes writing note- 50 minutes total care.    LOS: 9 days A FACE TO FACE EVALUATION WAS PERFORMED  Hugo Lybrand 01/20/2020, 5:38 PM

## 2020-01-20 NOTE — Progress Notes (Signed)
Occupational Therapy Session Note  Patient Details  Name: Benjamin [name] MRN: [id number] Date of Birth: December 11, 1950  Today's Date: 01/20/2020 OT Individual Time: 1330-1425 OT Individual Time Calculation (min): 55 min    Short Term Goals: Week 2:  OT Short Term Goal 1 (Week 2): STG=LTG secondary to ELOS  Skilled Therapeutic Interventions/Progress Updates:    Pt resting in recliner upon arrival and ready for therapy. Pt amb with RW into hallway and c/o increased pain in back when approaching elevators.  Pt requested to use w/c.  OT intervention with focus dynamic standing balance and ongoing discharge planning.  Pt initially engaged in [employer] activity X 3 (1 min X 2 and 2 min X 1 with no UE support). Pt transitioned to gym and engaged in standing activity hitting ball rapidly back to student nurses.  Pt also engaged in zoom ball activity while standing.  Pt completed all tasks with supervison. Pt required extended rest breaks between activities 2/2 back pain.  Pt returned to room and transferred back to recliner.  All needs within reach and seat alarm activated.   Therapy Documentation Precautions:  Precautions Precautions: Back Precaution Comments: BLT percautions  Required Braces or Orthoses: Spinal Brace Spinal Brace: Thoracolumbosacral orthotic Restrictions Weight Bearing Restrictions: No  Pain:  Pt c/o 6/10 pain in back with ambulation; repositioned   Therapy/Group: Individual Therapy  [name] 01/20/2020, 2:43 PM

## 2020-01-20 NOTE — Progress Notes (Addendum)
Physical Therapy Session Note  Patient Details  Name: Benjamin [name] MRN: [id number] Date of Birth: 09/27/1951  Today's Date: 01/20/2020 PT Individual Time: 1000-1100 PT Individual Time Calculation (min): 60 min   Short Term Goals: Week 1:  PT Short Term Goal 1 (Week 1): Pt will complete transfers at Supervision level consistently PT Short Term Goal 1 - Progress (Week 1): Progressing toward goal PT Short Term Goal 2 (Week 1): Pt will ambulate x 150 ft with min A and LRAD PT Short Term Goal 2 - Progress (Week 1): Met PT Short Term Goal 3 (Week 1): Pt will navigate 4 stairs with one handrail and min A PT Short Term Goal 3 - Progress (Week 1): Met Week 2:  PT Short Term Goal 1 (Week 2): =LTG due to ELOS Week 3:     Skilled Therapeutic Interventions/Progress Updates:     PAIN 6/10, increases to 7/10 w/activity, rest and repositioning provided throughout rx w/good results.  Pt initially OOB in recliner receiving meds and agreeable to treatment session w/focus on functional mobility training.  Therapist assisted pt w/donning TLSO in sitting and pt able to verbalize process today.  STS from recliner w/supervision to RW. Gait 255f w/RW w/supervision and cues for upright posture.  Pt requesting use of BR sensing bowel urgency.  Pt wc propulsion w/exts x 4 x 34f transported remaining distance to BR. STS from wc w/supervision and short distance gait to commode w/supervision.   Commode transfer - supervision and cues for safe hand placement/use of grab bar for safety.  Lowers and raises pants/brief with supervision.  Independent w/hygiene.   Car transfer - performs w/supervision, cues for hand placement/safe use of RW Ramp: ascends/descends w/supervision only using RW Uneven surface/mulch - 1035f/RW and supervision. Stairs:  Ascends/descends 4 stairs w/single rail lateral approach w/supervision ascending, cga descending. Gait [id number] w/cues for posture, supervision.  Pt c/o increasing pain,  returned to wc for rest/recovery. Pt transported to room.  STS from wc w/supervision, gait 10ft41frecliner, turn/sit to recliner w/cues for safe hand placement. Pt c/o discomfort and mult different positions/postural support provided until pt stated he was "as comfortable as I can be". Pt left oob in recliner w/chair alarm set and needs in reach.  Therapy Documentation Precautions:  Precautions Precautions: Back Precaution Comments: BLT percautions  Required Braces or Orthoses: Spinal Brace Spinal Brace: Thoracolumbosacral orthotic Restrictions Weight Bearing Restrictions: No    Therapy/Group: Individual Therapy  BarbaCallie Fielding  [place]/2021, 11:15 AM

## 2020-01-20 NOTE — Progress Notes (Signed)
Occupational Therapy Session Note  Patient Details  Name: Benjamin [name] MRN: [id number] Date of Birth: 1951-09-30  Today's Date: 01/20/2020 OT Individual Time: 0700-0810 OT Individual Time Calculation (min): 70 min    Short Term Goals: Week 2:  OT Short Term Goal 1 (Week 2): STG=LTG secondary to ELOS  Skilled Therapeutic Interventions/Progress Updates:    Pt resting in recliner upon arrival and agreeable to therapy. OT intervention with focus on functional amb with RW, standing balance, BADL training, discharge planning, and safety awareness to increase independence with BADLs and prepare for discharge home on 3/12. Pt completed bathing with sit<>stand from seat in shower and dressing with sit<>stand from shower chair and recliner. Pt currently completes all tasks, including toileting, at supervision level. Confirmed DME with pt and wife (BSC and TTB). Pt requires more than a reasonable amount of time to complete tasks with multiple rest breaks, primarily 2/2 increased pain with activity. Pt amb with RW to Day Room and returned to room.  Pt remained in recliner with all needs within reach and seat alarm activated.   Therapy Documentation Precautions:  Precautions Precautions: Back Precaution Comments: BLT percautions  Required Braces or Orthoses: Spinal Brace Spinal Brace: Thoracolumbosacral orthotic Restrictions Weight Bearing Restrictions: No  Pain: Pt c/o 5/10 back pain with activity; premedicated and repositioned  Therapy/Group: Individual Therapy  [name] 01/20/2020, 8:21 AM

## 2020-01-20 NOTE — Progress Notes (Signed)
Physical Therapy Note  Patient Details  Name: Benjamin [name] MRN: [id number] Date of Birth: Feb 20, 1951 Today's Date: 01/20/2020  Attempted to make up missed time. Upon arrival pt sitting in recliner. Pt politely declined participation in therapy stating "I would like to just relax and unwind after doing all my therapy today". Will attempt to make up time as schedule allows.   [place], DPT   01/20/2020, 3:18 PM

## 2020-01-20 NOTE — Progress Notes (Addendum)
Social Work Patient ID: Benjamin [name], male   DOB: [birth year], 69 y.o.   MRN: [id number]   SW received phone call from [name] [phone]) who reported that their preferred H[facility] is [facility]. She is aware SW will explore this as an option, and if they are unable to accept, will find another HHA.   SW faxed DME order: TTB, RW, 3in1 BSC to [place] ([id number]) and HHPT/OT order to [facility] [phone]: [phone]).  SW waiting on follow-up.   SW received phone call from [place] stating pt Medicare insurance is inactive, and unable to process order with [facility] alone since it is not the primary insurance. SW called pt wife, but unable to leave message. SW will continue to make efforts.   *SW received return phone call from pt wife. SW provided above information. SW also provided contact information for [facility] [place]/RD [phone]) and encouraged her to follow-up.  HHA referral accepted by [facility]. Issues resolved with DME (pt not listed as a "Jr."). Pt will need to private pay for TTB ($65.00). Wife is aware on DME and HHA updates.   SW delivered DME to room: TTB, RW, and 3in1 BSC.   Loralee Pacas, MSW, [place] Office: [phone] Cell: [phone] Fax: [phone]

## 2020-01-21 ENCOUNTER — Inpatient Hospital Stay (HOSPITAL_COMMUNITY): Payer: Medicare Other | Admitting: Physical Therapy

## 2020-01-21 ENCOUNTER — Inpatient Hospital Stay (HOSPITAL_COMMUNITY): Payer: Medicare Other

## 2020-01-21 LAB — GLUCOSE, CAPILLARY
Glucose-Capillary: 103 mg/dL — ABNORMAL HIGH (ref 70–99)
Glucose-Capillary: 107 mg/dL — ABNORMAL HIGH (ref 70–99)
Glucose-Capillary: 89 mg/dL (ref 70–99)
Glucose-Capillary: 134 mg/dL — ABNORMAL HIGH (ref 70–99)

## 2020-01-21 MED ORDER — OXYCODONE HCL ER 10 MG PO T12A: 10.0000 mg | EXTENDED_RELEASE_TABLET | Freq: Two times a day (BID) | ORAL | 0 refills | Status: DC

## 2020-01-21 MED ORDER — METHOCARBAMOL 500 MG PO TABS: 500.0000 mg | ORAL_TABLET | Freq: Four times a day (QID) | ORAL | 0 refills | Status: DC | PRN

## 2020-01-21 MED ORDER — LEVETIRACETAM 1000 MG PO TABS: 1000.0000 mg | ORAL_TABLET | Freq: Two times a day (BID) | ORAL | 0 refills | Status: DC

## 2020-01-21 MED ORDER — OXYCODONE HCL 10 MG PO TABS: 5.0000 mg | ORAL_TABLET | Freq: Two times a day (BID) | ORAL | 0 refills | Status: DC | PRN

## 2020-01-21 MED ORDER — POLYETHYLENE GLYCOL 3350 17 G PO PACK: 17.0000 g | PACK | Freq: Two times a day (BID) | ORAL | 0 refills | Status: DC

## 2020-01-21 MED ORDER — SENNA 8.6 MG PO TABS: 2.0000 | ORAL_TABLET | Freq: Every day | ORAL | 0 refills | Status: DC

## 2020-01-21 NOTE — Progress Notes (Signed)
Physical Therapy Session Note  Patient Details  Name: Benjamin [name] MRN: [id number] Date of Birth: [birth year]  Today's Date: 01/21/2020 PT Individual Time: 1000-1100 PT Individual Time Calculation (min): 60 min   Short Term Goals: Week 2:  PT Short Term Goal 1 (Week 2): =LTG due to ELOS  Skilled Therapeutic Interventions/Progress Updates:    Pt received seated in recliner in room, agreeable to PT session. Pt at mod I level for sit to stand to RW throughout session. Ambulation x 200 ft with RW at mod I level. Car transfer with setup A for RW management. Ascend/descend 4 stairs with L handrail at Supervision level. Discussed RW management on stairs and how pt's family is to assist him with RW management, pt with good understanding. Dynamic standing balance with RW and close Supervision to CGA: weaving through cones, side-stepping through cones, side-stepping with L/R cone taps. Reviewed BLT with pt able to recall back precautions by end of session.Toilet transfer at mod I level. Pt left seated in recliner in room with needs in reach at end of session.  Therapy Documentation Precautions:  Precautions Precautions: Back Precaution Comments: BLT precautions Required Braces or Orthoses: Spinal Brace Spinal Brace: Thoracolumbosacral orthotic Restrictions Weight Bearing Restrictions: No    Therapy/Group: Individual Therapy   Excell Seltzer, PT, DPT  01/21/2020, 12:46 PM

## 2020-01-21 NOTE — Progress Notes (Signed)
Physical Therapy Discharge Summary  Patient Details  Name: Benjamin [name] MRN: [id number] Date of Birth: Dec 06, 1950  Today's Date: 01/21/2020   Patient has met 6 of 6 long term goals due to improved activity tolerance, improved balance, improved postural control and ability to compensate for deficits.  Patient to discharge at an ambulatory level Modified Independent.   Patient's care partner is independent to provide the necessary physical assistance at discharge. Pt just requires setup A for car transfers and Supervision assist for stair management. Pt is able to communicate with his family how to safely assist him with these transfers and stair management.  Reasons goals not met: Patient has met all rehab goals.  Recommendation:  Patient will benefit from ongoing skilled PT services in home health setting to continue to advance safe functional mobility, address ongoing impairments in endurance, balance, safety, and minimize fall risk.  Equipment: RW  Reasons for discharge: treatment goals met and discharge from hospital  Patient/family agrees with progress made and goals achieved: Yes  PT Discharge Precautions/Restrictions Precautions Precautions: Back Precaution Comments: BLT precautions Required Braces or Orthoses: Spinal Brace Spinal Brace: Thoracolumbosacral orthotic Restrictions Weight Bearing Restrictions: No Vision/Perception  Perception Perception: Within Functional Limits Praxis Praxis: Intact  Cognition Overall Cognitive Status: Within Functional Limits for tasks assessed Arousal/Alertness: Awake/alert Orientation Level: Oriented X4 Attention: Selective Selective Attention: Appears intact Memory: Appears intact Awareness: Appears intact Problem Solving: Appears intact Safety/Judgment: Appears intact Sensation Sensation Light Touch: Appears Intact Proprioception: Appears Intact Additional Comments: BLE edema Coordination Gross Motor Movements are Fluid and  Coordinated: Yes Fine Motor Movements are Fluid and Coordinated: Yes Motor  Motor Motor: Abnormal postural alignment and control Motor - Skilled Clinical Observations: impaired 2/2 pain Motor - Discharge Observations: impaired 2/2 ongoing pain  Mobility Bed Mobility Bed Mobility: Rolling Right;Rolling Left;Supine to Sit;Sit to Supine Rolling Right: Independent with assistive device Rolling Left: Independent with assistive device Left Sidelying to Sit: Independent with assistive device Supine to Sit: Independent with assistive device Sit to Supine: Independent with assistive device Transfers Transfers: Sit to Stand;Stand Pivot Transfers Sit to Stand: Independent with assistive device Stand to Sit: Independent with assistive device Stand Pivot Transfers: Independent with assistive device Transfer (Assistive device): Rolling walker Locomotion  Gait Ambulation: Yes Gait Assistance: Independent with assistive device Gait Distance (Feet): 200 Feet Assistive device: Rolling walker Gait Gait: Yes Gait Pattern: Impaired Gait Pattern: Trunk flexed Gait velocity: decreased High Level Ambulation High Level Ambulation: Side stepping Side Stepping: Supervision with RW Stairs / Additional Locomotion Stairs: Yes Stairs Assistance: Supervision/Verbal cueing Stair Management Technique: One rail Left;Step to pattern;Sideways Number of Stairs: 4 Height of Stairs: 6 Ramp: Supervision/Verbal cueing Wheelchair Mobility Wheelchair Mobility: Yes Wheelchair Assistance: Chartered loss adjuster: Both upper extremities Wheelchair Parts Management: Needs assistance Distance: 150  Trunk/Postural Assessment  Cervical Assessment Cervical Assessment: Exceptions to WFL(forward head) Thoracic Assessment Thoracic Assessment: Exceptions to WFL(spinal precautions; rounded shoulders) Lumbar Assessment Lumbar Assessment: Exceptions to WFL(spinal precautions; posterior pelvic  tilt) Postural Control Postural Control: Deficits on evaluation  Balance Balance Balance Assessed: Yes Static Sitting Balance Static Sitting - Balance Support: No upper extremity supported;Feet supported Static Sitting - Level of Assistance: 7: Independent Dynamic Sitting Balance Dynamic Sitting - Balance Support: During functional activity Dynamic Sitting - Level of Assistance: 6: Modified independent (Device/Increase time) Static Standing Balance Static Standing - Balance Support: No upper extremity supported;During functional activity Static Standing - Level of Assistance: 6: Modified independent (Device/Increase time) Dynamic Standing Balance Dynamic Standing - Balance  Support: Bilateral upper extremity supported;During functional activity Dynamic Standing - Level of Assistance: 6: Modified independent (Device/Increase time) Extremity Assessment   RLE Assessment RLE Assessment: Within Functional Limits General Strength Comments: 4+/5 grossly LLE Assessment LLE Assessment: Within Functional Limits General Strength Comments: 4+/5 grossly     Excell Seltzer, PT, DPT 01/21/2020, 12:52 PM

## 2020-01-21 NOTE — Discharge Summary (Signed)
Physician Discharge Summary  Patient ID: Benjamin [name] MRN: [id number] DOB/AGE: Jul 31, 1951 69 y.o.  Admit date: 01/11/2020 Discharge date: 01/22/2020  Discharge Diagnoses:  Principal Problem:   Thoracic vertebral fracture [facility]) Active Problems:   Peripheral axonal neuropathy   Seizures (HCC)   Psoriatic arthritis ([place])   Closed T11 spinal fracture (HCC)   Diabetes ([place])   Discharged Condition: stable   Significant Diagnostic Studies: DG Abd 1 View  Result Date: 01/11/2020 CLINICAL DATA:  Abdominal distension EXAM: ABDOMEN - 1 VIEW COMPARISON:  CT abdomen pelvis 10/03/2016 FINDINGS: Mild nonspecific air distention of the colon without high-grade obstructive bowel gas pattern within the small bowel. No suspicious calcifications. Prior thoracolumbar fusion is noted as well as postprocedural changes from prior lower lumbar vertebroplasty. Remaining osseous structures are unremarkable. IMPRESSION: Mild nonspecific air distention of the colon without high-grade obstructive bowel gas pattern within the small bowel. Electronically Signed   By: Lovena Le M.D.   On: 01/11/2020 21:06    Labs:  Basic Metabolic Panel: BMP Latest Ref Rng & Units 01/18/2020 01/12/2020 01/10/2020  Glucose 70 - 99 mg/dL 123(H) 180(H) 172(H)  BUN 8 - 23 mg/dL 10 11 13   Creatinine 0.61 - 1.24 mg/dL 0.97 1.00 1.03  BUN/Creat Ratio 10 - 24 - - -  Sodium 135 - 145 mmol/L 136 135 136  Potassium 3.5 - 5.1 mmol/L 4.3 3.9 3.9  Chloride 98 - 111 mmol/L 101 99 103  CO2 22 - 32 mmol/L 27 28 26   Calcium 8.9 - 10.3 mg/dL 8.4(L) 7.7(L) 8.0(L)    CBC: CBC Latest Ref Rng & Units 01/18/2020 01/12/2020 01/09/2020  WBC 4.0 - 10.5 K/uL 7.4 5.9 12.6(H)  Hemoglobin 13.0 - 17.0 g/dL 11.9(L) 11.0(L) 13.3  Hematocrit 39.0 - 52.0 % 36.0(L) 32.7(L) 39.5  Platelets 150 - 400 K/uL 310 170 162    CBG: Recent Labs  Lab 01/21/20 1138 01/21/20 1640 01/21/20 2105 01/22/20 0622 01/22/20 1145  GLUCAP 107* 89 134* 110* 110*    Brief HPI:    Benjamin [name] is a 69 y.o. male with history of BPH, DISH, lumbar stenosis with peripheral neuropathy, psoriasis, seizure disorder who was admitted on 01/08/2020 after a fall off a ladder due to seizures.  He was loaded with Keppra and dose was increased to 1000 mg twice daily.  He was found to have unstable only currently T11 vertebral body fracture with acute fractures of L1, L2 and L3 transverse processes.  He was taken to the OR emergently for T9-L2 posterior fusion by Dr. Venetia Constable.  TLSO was ordered to be worn as needed for comfort.  He has had issues with poor pain control therefore OxyContin was added.  Therapy evaluations done revealing decline in mobility and ADLs.  CIR was recommended due to functional decline.   Hospital Course: Benjamin [name] was admitted to rehab 01/11/2020 for inpatient therapies to consist of PT, ST and OT at least three hours five days a week. Past admission physiatrist, therapy team and rehab RN have worked together to provide customized collaborative inpatient rehab. Blood pressures were monitored on TID basis and and have been relatively controlled.  Follow up CBC showed that ABLA is resolving.  Low-grade fevers have resolved without signs of infection.  Check of  BMET showed fasting hyperglycemia and Hgb A1C of 6.7 showed evidence of newly diagnosed diabetes.  Blood sugars were monitored on achs basis and patient was educated on carb modified diet.  He declined addition of medication at this time and is to  follow-up with PCP for further input.  His back incision is C/D/I with minimal irritation.  Abdominal distention noted at admission and KUB was negative for ileus.  Laxatives added to help manage constipation.  He continued to report poor rate control pain and OxyContin was added.  He was noted to have some issues with confusion paranoia and higher doses therefore this decreased with improvement in mentation.  He is activity tolerance and mobility has improved and is currently  modified independent with use of walker.  He has been educated on gradual taper of narcotics after discharge.  He will continue to receive further follow-up home health PT and OT by [facility] home health after discharge.   Rehab course: During patient's stay in rehab weekly team conferences were held to monitor patient's progress, set goals and discuss barriers to discharge. At admission, patient required max assist with ADL task and mod assist with mobility. He  has had improvement in activity tolerance, balance, postural control as well as ability to compensate for deficits. He is able to complete all ADL tasks at modified independent level and requires supervision for simple home management tasks.  No unsafe behaviors exhibited. He is modified independent for transfers and to ambulate 200 feet with rolling walker.  He requires supervision for 4 stairs and for car transfers.  Family education was completed with wife regarding all aspects of safety and mobility.   Discharge disposition: 01-Home or Self Care  Diet: Carb Modified diet.   Special Instructions: 1. No driving till seizure free for 6 months. 2.  No strenuous activity.  Discharge Instructions    Ambulatory referral to Physical Medicine Rehab   Complete by: As directed    1-2 weeks TC appt     Allergies as of 01/22/2020      Reactions   Aspartame And Phenylalanine Other (See Comments)   Powder causes seizures   Benadryl [diphenhydramine] Other (See Comments)   seizures   Zoloft [sertraline Hcl] Other (See Comments)   Confusion Memory loss      Medication List    STOP taking these medications   alendronate 70 MG tablet Commonly known as: FOSAMAX     TAKE these medications   acetaminophen 325 MG tablet Commonly known as: TYLENOL Take 2 tablets (650 mg total) by mouth 3 (three) times daily. What changed:   medication strength  how much to take  when to take this   ALPRAZolam 1 MG tablet Commonly known as:  Xanax 0.5 mg at night if not able to sleep- this is for insomnia.   levETIRAcetam 1000 MG tablet Commonly known as: KEPPRA Take 1 tablet (1,000 mg total) by mouth 2 (two) times daily.   Melatonin 3 MG Tabs Take 1 tablet (3 mg total) by mouth at bedtime as needed (sleep).   methocarbamol 500 MG tablet Commonly known as: ROBAXIN Take 1 tablet (500 mg total) by mouth every 6 (six) hours as needed for muscle spasms.   oxyCODONE 10 mg 12 hr tablet--Rx #14 pills Commonly known as: OXYCONTIN Take 1 tablet (10 mg total) by mouth every 12 (twelve) hours. What changed: Another medication with the same name was changed. Make sure you understand how and when to take each. Notes to patient: Wean to once a day after 5 days.    Oxycodone HCl 10 MG Tabs--Rx# 15 pills Take 0.5-1 tablets (5-10 mg total) by mouth every 6 (six) hours as needed. What changed:   medication strength  how much to take  reasons to take this Notes to patient: Need to take 1/2 pill as needed and can repeat additional dose if no relief in 30 minutes. Need to wean down to every 8 hours as needed after a couple of days. Then every 12 hours as needed and then to off   polyethylene glycol 17 g packet Commonly known as: MIRALAX / GLYCOLAX Take 17 g by mouth 2 (two) times daily. Notes to patient: For constipation   senna 8.6 MG Tabs tablet Commonly known as: SENOKOT Take 2 tablets (17.2 mg total) by mouth daily. Notes to patient: For constipation.       Follow-up Information    Lovorn, Jinny Blossom, MD Follow up.   Specialty: Physical Medicine and Rehabilitation Why: Office will call you with follow up appointment Contact information: [id number] [place] [address] [phone]        Cyndi Bender, PA-C. Call on 01/25/2020.   Specialty: Physician Assistant Why: for post hospital follow up Contact information: [place] [address] [phone]        Judith Part, MD  Follow up on 01/25/2020.   Specialty: Neurosurgery Contact information: [place] [address] [phone]           Signed: Bary Leriche 01/25/2020, 11:19 PM

## 2020-01-21 NOTE — Progress Notes (Signed)
Occupational Therapy Session Note  Patient Details  Name: Lessie Manigo MRN: [id number] Date of Birth: [birth year]  Today's Date: 01/21/2020 OT Individual Time: 0700-0810 OT Individual Time Calculation (min): 70 min    Short Term Goals: Week 1:  OT Short Term Goal 1 (Week 1): Pt with complete bathing with use of AE PRN Min assist OT Short Term Goal 1 - Progress (Week 1): Met OT Short Term Goal 2 (Week 1): Pt will complete toilet transfers Supervision with LRAD OT Short Term Goal 2 - Progress (Week 1): Met OT Short Term Goal 3 (Week 1): Pt will complete toileting tasks with Min assist OT Short Term Goal 3 - Progress (Week 1): Met OT Short Term Goal 4 (Week 1): Pt will maintain standing for 2 grooming tasks to demonstrate increased activity tolerance  Skilled Therapeutic Interventions/Progress Updates:    OT intervention with focus on BADL and IADL training in preparation for discharge home tomorrow.  Pt completes all bathing/dressing and toileting tasks at mod I level.  Pt amb in room for simple home mgmt tasks (picking up clothing, etc.) with supervision.  Pt pleased with progress and ready for discharge home tomorrow.    Therapy Documentation Precautions:  Precautions Precautions: Back Precaution Comments: BLT percautions  Required Braces or Orthoses: Spinal Brace Spinal Brace: Thoracolumbosacral orthotic Restrictions Weight Bearing Restrictions: No  Pain:   Pt c/o 6/10 back pain; pain meds prior to therapy and repositioned   Therapy/Group: Individual Therapy  [name] 01/21/2020, 8:13 AM

## 2020-01-21 NOTE — Progress Notes (Signed)
Physical Therapy Session Note  Patient Details  Name: Benjamin [name] MRN: [id number] Date of Birth: 1951/03/27  Today's Date: 01/21/2020 PT Individual Time: 7741-4239 PT Individual Time Calculation (min): 69 min   Short Term Goals: Week 1:  PT Short Term Goal 1 (Week 1): Pt will complete transfers at Supervision level consistently PT Short Term Goal 1 - Progress (Week 1): Progressing toward goal PT Short Term Goal 2 (Week 1): Pt will ambulate x 150 ft with min A and LRAD PT Short Term Goal 2 - Progress (Week 1): Met PT Short Term Goal 3 (Week 1): Pt will navigate 4 stairs with one handrail and min A PT Short Term Goal 3 - Progress (Week 1): Met Week 2:  PT Short Term Goal 1 (Week 2): =LTG due to ELOS  Skilled Therapeutic Interventions/Progress Updates:    Session focused on overall functional endurance, furniture transfers, bed mobility in ADL apartment, gait with RW x 100' for functional mobility training (focus on maintaining upright posture), dynamic standing balance activities, and functional LE strengthening.   Pt performs transfers at overall modified independent level. Requires extra time from low surface of rocking recliner with RW. Performed bed mobility modified independent for log roll technique due to back precautions in regular bed with pt able to recall and demonstrate appropriately. Discussed sleeping positions and educated on use of pillow between knees for sidesleeping for better alignment of spine.  Education in regards to energy conservation techniques, self pacing, fall risk, and adjustment to having a pet at home (fall risk). standing balance activity during therapeutic rest breaks. Engaged in [employer] with focus on sit <> stands, standing balance and standing tolerance (on non compliant and compliant surface). Pt performed sit <> stands mod I with RW or supervision without AD. Demonstrates mod I to supervision balance with 1 UE support but min assist while on foam surface  due to posterior bias and decreased ankle strategies. Nustep for BLE strengthening and overall cardiovascular endurance x 8 min on level 4. transferred back to recliner end of session with RW and all needs in reach.   Therapy Documentation Precautions:  Precautions Precautions: Back Precaution Comments: BLT precautions Required Braces or Orthoses: Spinal Brace Spinal Brace: Thoracolumbosacral orthotic Restrictions Weight Bearing Restrictions: No  Pain:  Denies pain.    Therapy/Group: Individual Therapy  Canary Brim Ivory Broad, PT, DPT, CBIS  01/21/2020, 2:29 PM

## 2020-01-21 NOTE — Progress Notes (Signed)
Occupational Therapy Discharge Summary  Patient Details  Name: Ardean Melroy MRN: [id number] Date of Birth: [birth year]  Patient has met 65 of 11 long term goals due to improved activity tolerance, improved balance, postural control, ability to compensate for deficits and improved coordination.  Pt made steady progress with BADLs and IADLs during this admission.  Pt completes all bathing, dressing, and toileting tasks at mod I level.  Pt completes simple home mgmt tasks with supervision.  Pt exhibitis no unsafe behaviors.  Pt's wife has not been present for therapy. Patient to discharge at overall Modified Independent level.  Patient's care partner is independent to provide the necessary physical assistance at discharge.    Recommendation:  Patient will benefit from ongoing skilled OT services in home health setting to continue to advance functional skills in the area of BADL and Reduce care partner burden.  Equipment: TTB, BSC  Reasons for discharge: treatment goals met and discharge from hospital  Patient/family agrees with progress made and goals achieved: Yes  OT Discharge Vision Baseline Vision/History: Wears glasses Wears Glasses: At all times Patient Visual Report: No change from baseline Vision Assessment?: No apparent visual deficits Perception  Perception: Within Functional Limits Praxis Praxis: Intact Cognition Overall Cognitive Status: Within Functional Limits for tasks assessed Arousal/Alertness: Awake/alert Orientation Level: Oriented X4 Attention: Selective Selective Attention: Appears intact Memory: Appears intact Immediate Memory Recall: Sock;Blue;Bed Memory Recall Sock: Without Cue Memory Recall Blue: Without Cue Memory Recall Bed: Without Cue Problem Solving: Appears intact Safety/Judgment: Appears intact Sensation Sensation Light Touch: Appears Intact Hot/Cold: Appears Intact Proprioception: Appears Intact Coordination Gross Motor Movements are Fluid  and Coordinated: Yes Fine Motor Movements are Fluid and Coordinated: Yes Motor  Motor Motor: Abnormal postural alignment and control Motor - Skilled Clinical Observations: impaired 2/2 pain     Trunk/Postural Assessment  Cervical Assessment Cervical Assessment: (forward head) Thoracic Assessment Thoracic Assessment: (spinal precautions, rounded shoulders) Lumbar Assessment Lumbar Assessment: (spinal precautions, posterior pelvic tilt)  Balance Static Sitting Balance Static Sitting - Balance Support: No upper extremity supported;Feet supported Static Sitting - Level of Assistance: 7: Independent Dynamic Sitting Balance Dynamic Sitting - Balance Support: During functional activity Dynamic Sitting - Level of Assistance: 6: Modified independent (Device/Increase time) Extremity/Trunk Assessment RUE Assessment RUE Assessment: Within Functional Limits LUE Assessment LUE Assessment: Within Functional Limits   [name] 01/21/2020, 6:42 AM

## 2020-01-21 NOTE — Progress Notes (Signed)
[facility] PHYSICAL MEDICINE & REHABILITATION PROGRESS NOTE   Subjective/Complaints:   Pt said is sleeping this AM- doesn't want to talk to physician.  Does say that pain is better controlled overall esp yesterday however wants to take a nap before OT today.   Pain meds "working' for him. In spite of reducing doses yesterday, is actually feeling better.   ROS: Pt denies SOB, CP, N/V/C/D  Objective:   No results found. No results for input(s): WBC, HGB, HCT, PLT in the last 72 hours. No results for input(s): NA, K, CL, CO2, GLUCOSE, BUN, CREATININE, CALCIUM in the last 72 hours.  Intake/Output Summary (Last 24 hours) at 01/21/2020 1038 Last data filed at 01/21/2020 [id number] Gross per 24 hour  Intake 540 ml  Output -  Net 540 ml     Physical Exam: Vital Signs Blood pressure (!) 110/59, pulse 68, temperature 98.8 F (37.1 C), temperature source Oral, resp. rate 18, height 6' (1.829 m), weight 100.4 kg, SpO2 97 %.  Physical Exam Gen: sitting back in bedside recliner, sleepy, has sdafety belt in place, NAD HEENT: conjugate gaze Neck:supple Heart: RRR Pulm: CTA B/L- good air movement Abdomen: soft, NT, ND; (+)BS normoactive Extremities:No clubbing, cyanosis, or edema. Pulses 2+ and B/L Skin:Psoriatic patches on bilateral knees.  Spinal incision C/D/I Neuro:Ox3- more alert this am in spite of sleepiness Musculoskeletal:full ROM of extremities/joints. LEs 1-2+ LE edema in feet and ankles only.  Psych:  sleepy   Assessment/Plan: 1. Functional deficits secondary to Vertebral thoracic fx s/p T9-L2 fusion which require 3+ hours per day of interdisciplinary therapy in a comprehensive inpatient rehab setting.  Physiatrist is providing close team supervision and 24 hour management of active medical problems listed below.  Physiatrist and rehab team continue to assess barriers to discharge/monitor patient progress toward functional and medical goals  Care Tool:  Bathing     Body parts bathed by patient: Right arm, Left arm, Chest, Abdomen, Front perineal area, Right upper leg, Left upper leg, Buttocks, Right lower leg, Left lower leg, Face   Body parts bathed by helper: Buttocks, Right lower leg, Left lower leg     Bathing assist Assist Level: Independent with assistive device     Upper Body Dressing/Undressing Upper body dressing   What is the patient wearing?: Pull over shirt, Orthosis    Upper body assist Assist Level: Independent    Lower Body Dressing/Undressing Lower body dressing      What is the patient wearing?: Pants, Underwear/pull up     Lower body assist Assist for lower body dressing: Independent with assitive device     Toileting Toileting    Toileting assist Assist for toileting: Independent with assistive device     Transfers Chair/bed transfer  Transfers assist     Chair/bed transfer assist level: Supervision/Verbal cueing     Locomotion Ambulation   Ambulation assist      Assist level: Supervision/Verbal cueing Assistive device: Walker-rolling Max distance: 200   Walk 10 feet activity   Assist     Assist level: Supervision/Verbal cueing Assistive device: Walker-rolling   Walk 50 feet activity   Assist    Assist level: Supervision/Verbal cueing Assistive device: Walker-rolling    Walk 150 feet activity   Assist Walk 150 feet activity did not occur: Safety/medical concerns  Assist level: Supervision/Verbal cueing Assistive device: Walker-rolling    Walk 10 feet on uneven surface  activity   Assist Walk 10 feet on uneven surfaces activity did not occur: Safety/medical concerns  Assist level: Supervision/Verbal cueing Assistive device: Aeronautical engineer Will patient use wheelchair at discharge?: No Type of Wheelchair: Manual    Wheelchair assist level: Supervision/Verbal cueing Max wheelchair distance: 50'    Wheelchair 50 feet with 2 turns  activity    Assist        Assist Level: Supervision/Verbal cueing   Wheelchair 150 feet activity     Assist      Assist Level: Supervision/Verbal cueing   Blood pressure (!) 110/59, pulse 68, temperature 98.8 F (37.1 C), temperature source Oral, resp. rate 18, height 6' (1.829 m), weight 100.4 kg, SpO2 97 %.  Medical Problem List and Plan: 1.  Impaired mobility and ADLs s/p T9-L2 posterior spinal instrumented fusion following fall due to seizure             -patient may shower but incision site must be covered             -ELOS/Goals: 9-11 days modI in PT and OT and I in SLP             -Use thoracolumbosacral orthotic when ambulating.             Spinal precautions.   Continue CIR PT, OT 2.  Antithrombotics: -DVT/anticoagulation:  Pharmaceutical: Lovenox             -antiplatelet therapy: N/A 3. Pain Management: Now on OxyContin 10 mg bid with oxycodone prn. Consider addition of Gabapentin 300mg  TID for neuropathic pain.   3/2- will increase Oxy IR to 5-15 mg q4 hours for now- and monitor- not taking prns often.  3/3- encouraged pt to ASK for prn pain meds- hasn't been.    3/4- pain better controlled now that ASKING for prns- working much better  3/5- hurting again this AM- cannot do tramadol due to seizure dx- will increase Oxycontin to 15 mg BID  3/6: well controlled.   3/7: in pain today. Schedule Tylenol to 650 q8 for better pain control.   3/8- pt has gotten a lot of increases to pain meds, in last week- will wait to increase for now.  3/9- pt and I discussed- he says he's hurting and admits he doesn't remember when to ask- also, doesn't want to get addicted to meds- agreed not to increase dosages today- also went over how will be decreased when goes home.  3/11- pt tolerating decrease in pain meds  To 10 mg BID oxycontin and Oxy IR 5-10 mg q4 hours- will decrease again at d/c to 5 mg Oxyocdone q6 hours 4. Mood: LCSW to follow for evaluation and support. Has  history of depression.              -antipsychotic agents: N/a 5. Neuropsych: This patient is capable of making decisions on his own behalf. 6. Skin/Wound Care: Monitor wound for healing.  7. Fluids/Electrolytes/Nutrition: Monitor I/O. Check lytes in am.   3/3- labs came back late yesterday- BUN 11 and Cr 1.0- K+ 3.8  3/8- BUN 10 and Cr 0.97- K+ 3.8- con't PO fluids. 8. Seizures: History of nocturnal seizures. Breakthrough seizures---patient unable to recall home dose. Now on Keppra 1000 mg bid.  3/4- no seizure activity- con't regimen  3/5- cannot add tramadol due to seizure dx'- lowers threshold.   3/9- pt notes cannot have diet drinks due ot increased risk of seizures 9. Ileus? : Abdominal distension with minimal bowel sounds. He has not had a BM since admission. Will order  KUB for work up. Start miralax bid and order enema for today  3/2- had large BM after enema today- will con't to monitor BMs closely so doesn't get impacted  3/4- started Senokot 2 tabs daily- will hold off miralax/additonal meds for now- monitor  3/8- going more regularly- almost daily. 10. Leucocytosis: Low graded fevers noted--will check UA/UCS. Monitor for signs of infection.  3/2- U/A negative for UTI- labs still pending- drawn late today.  3/3- WBC 5.7- no signs of infection currently.   11. Hyperglycemia: Elevated fasting BS--will order ac/hs cbg X 2-3 days and check Hgb A1C.   3/2- A1c is 6.7- so pt meets criteria for DM type 2- will d/w pt possible treatments- also needs to do CBGs, which has been declining intermittently.   CBG (last 3)  Recent Labs    01/20/20 1617 01/20/20 2102 01/21/20 0630  GLUCAP 120* 124* 103*   3/5- fair control- will d/w pt to start Metformin?  3/7: well controlled  3/8- BGs better since stopped drinking so many cokes with every meal- will hold off on Metformin for now and monitor.   3/9- pt still drinking cokes- will get Dietary education on newly Dx'd DM- also, will wait to  start metformin if changes diet- diet is main cause base don drinking soda with every meal.   3/10- pt said doesn't want meds- ignoring [place] about BGs- willing to talk to PCP-   3/11- pt declines med- wife said pt is "sugaraholic" and eats too many sweets- likely the cause of increase A1c. Still drinking coke for meals.  12.  Chronic insomnia: Due to anxiety? Has declined sleep study. Will add xanax prn  13. Peripheral axonal neuropathy: Numbness bilateral feet and hands as well as LLE.  14. Previous constipation  3/3- added senokot 2 tabs daily to stop pt from getting backed up again. Will start tomorrow.   15. High Blood Pressure- was 157/95 this AM- is normally 120s-130s over 80s- might be due to too small cuff? Will monitor trends and treat if needed.   3/5- keeps running [id number] at night systolic- but good control during day- if doesn't improve, will need to add something small.  3/6: borderline to hypotensive this morning. Given that was elevated yesterday, continue to monitor.   3/8- BP much improved- no Hypertension in last 48 hours.   3/9- BP much improved- con't no meds  3/11- no hypotension 16. Insomnia: Added melatonin 3mg  prn.  Sleeping better 17. Poor memory- likely due to pain meds- will reduce Oxycontin to 10 mg BID and reduce oxycodone to 5-10 mg q4 hours and get rid of Oxy 15 mg fast acting- wife insisted "won't take him home" confused.   3/11- appears better today with decrease in meds-     LOS: 10 days A FACE TO FACE EVALUATION WAS PERFORMED  Roger Fasnacht 01/21/2020, 10:38 AM

## 2020-01-21 NOTE — Plan of Care (Signed)
  Problem: Consults Goal: RH SPINAL CORD INJURY PATIENT EDUCATION Description:  See Patient Education module for education specifics.  Outcome: Progressing Goal: Skin Care Protocol Initiated - if Braden Score 18 or less Description: If consults are not indicated, leave blank or document N/A Outcome: Progressing Goal: Nutrition Consult-if indicated Outcome: Progressing Goal: Diabetes Guidelines if Diabetic/Glucose > 140 Description: If diabetic or lab glucose is > 140 mg/dl - Initiate Diabetes/Hyperglycemia Guidelines & Document Interventions  Outcome: Progressing   Problem: SCI BOWEL ELIMINATION Goal: RH STG MANAGE BOWEL WITH ASSISTANCE Description: STG Manage Bowel with mod I Assistance. Outcome: Progressing Goal: RH STG SCI MANAGE BOWEL WITH MEDICATION WITH ASSISTANCE Description: STG SCI Manage bowel with medication with mod I assistance. Outcome: Progressing Goal: RH STG MANAGE BOWEL W/EQUIPMENT W/ASSISTANCE Description: STG Manage Bowel With Equipment With Assistance Outcome: Progressing Goal: RH STG SCI MANAGE BOWEL PROGRAM W/ASSIST OR AS APPROPRIATE Description: STG SCI Manage bowel program w/assist or as appropriate. Outcome: Progressing Goal: RH OTHER STG BOWEL ELIMINATION GOALS W/ASSIST Description: Other STG Bowel Elimination Goals With Assistance. Outcome: Progressing   Problem: SCI BLADDER ELIMINATION Goal: RH STG MANAGE BLADDER WITH ASSISTANCE Description: STG Manage Bladder With mod I Assistance Outcome: Progressing Goal: RH STG MANAGE BLADDER WITH MEDICATION WITH ASSISTANCE Description: STG Manage Bladder With Medication With mod I Assistance. Outcome: Progressing Goal: RH STG MANAGE BLADDER WITH EQUIPMENT WITH ASSISTANCE Description: STG Manage Bladder With Equipment With Assistance Outcome: Progressing Goal: RH STG SCI MANAGE BLADDER PROGRAM W/ASSISTANCE Outcome: Progressing Goal: RH OTHER STG BLADDER ELIMINATION GOALS W/ASSIST Description: Other STG  Bladder Elimination Goals With Assistance Outcome: Progressing   Problem: RH SKIN INTEGRITY Goal: RH STG SKIN FREE OF INFECTION/BREAKDOWN Outcome: Progressing Goal: RH STG MAINTAIN SKIN INTEGRITY WITH ASSISTANCE Description: STG Maintain Skin Integrity With mod I Assistance. Outcome: Progressing Goal: RH STG ABLE TO PERFORM INCISION/WOUND CARE W/ASSISTANCE Description: STG Able To Perform Incision/Wound Care With Assistance. Outcome: Progressing   Problem: RH PAIN MANAGEMENT Goal: RH STG PAIN MANAGED AT OR BELOW PT'S PAIN GOAL Description: Pain scale less than 5/10 Outcome: Progressing   Problem: RH SAFETY Goal: RH STG ADHERE TO SAFETY PRECAUTIONS W/ASSISTANCE/DEVICE Description: STG Adhere to Safety Precautions With Assistance/Device. Outcome: Progressing Goal: RH STG DECREASED RISK OF FALL WITH ASSISTANCE Description: STG Decreased Risk of Fall With mod I Assistance. Outcome: Progressing   Problem: RH PAIN MANAGEMENT Goal: RH STG PAIN MANAGED AT OR BELOW PT'S PAIN GOAL Description: Pain scale less than 5/10 Outcome: Progressing   Problem: RH KNOWLEDGE DEFICIT SCI Goal: RH STG INCREASE KNOWLEDGE OF SELF CARE AFTER SCI Outcome: Progressing

## 2020-01-22 LAB — GLUCOSE, CAPILLARY
Glucose-Capillary: 110 mg/dL — ABNORMAL HIGH (ref 70–99)
Glucose-Capillary: 110 mg/dL — ABNORMAL HIGH (ref 70–99)

## 2020-01-22 MED ORDER — OXYCODONE HCL 10 MG PO TABS: 5.0000 mg | ORAL_TABLET | Freq: Four times a day (QID) | ORAL | 0 refills | Status: DC | PRN

## 2020-01-22 NOTE — Progress Notes (Signed)
[facility] PHYSICAL MEDICINE & REHABILITATION PROGRESS NOTE   Subjective/Complaints:   Pt admits was having confusion and paranoia due to pain meds- is better with meds decreased- wants to  continue to reduce dose.   Explained will be reducing dose today at d/c again to 5 mg- and con't long acting for at least 1 more week.    ROS: Pt denies SOB, CP, N/V/C/D  Objective:   No results found. No results for input(s): WBC, HGB, HCT, PLT in the last 72 hours. No results for input(s): NA, K, CL, CO2, GLUCOSE, BUN, CREATININE, CALCIUM in the last 72 hours.  Intake/Output Summary (Last 24 hours) at 01/22/2020 0936 Last data filed at 01/21/2020 1738 Gross per 24 hour  Intake 720 ml  Output --  Net 720 ml     Physical Exam: Vital Signs Blood pressure (!) 146/81, pulse 76, temperature 98.7 F (37.1 C), resp. rate 18, height 6' (1.829 m), weight 100.4 kg, SpO2 99 %.  Physical Exam Gen: sitting up in bedside manual w/c, more appropriate, NAD HEENT: conjugate gaze Neck:supple Heart: RRR Pulm: CTA B/L- good air movement Abdomen: soft, NT, ND; (+)BS normoactive Extremities:No clubbing, cyanosis, or edema. Pulses 2+ and B/L Skin:Psoriatic patches on bilateral knees.  Spinal incision C/D/I Neuro:Ox3- more alert this am in spite of sleepiness Musculoskeletal:full ROM of extremities/joints. LEs 1-2+ LE edema in feet and ankles only.  Psych:  sleepy   Assessment/Plan: 1. Functional deficits secondary to Vertebral thoracic fx s/p T9-L2 fusion which require 3+ hours per day of interdisciplinary therapy in a comprehensive inpatient rehab setting.  Physiatrist is providing close team supervision and 24 hour management of active medical problems listed below.  Physiatrist and rehab team continue to assess barriers to discharge/monitor patient progress toward functional and medical goals  Care Tool:  Bathing    Body parts bathed by patient: Right arm, Left arm, Chest, Abdomen,  Front perineal area, Right upper leg, Left upper leg, Buttocks, Right lower leg, Left lower leg, Face   Body parts bathed by helper: Buttocks, Right lower leg, Left lower leg     Bathing assist Assist Level: Independent with assistive device     Upper Body Dressing/Undressing Upper body dressing   What is the patient wearing?: Pull over shirt, Orthosis    Upper body assist Assist Level: Independent    Lower Body Dressing/Undressing Lower body dressing      What is the patient wearing?: Pants, Underwear/pull up     Lower body assist Assist for lower body dressing: Independent with assitive device     Toileting Toileting    Toileting assist Assist for toileting: Independent with assistive device     Transfers Chair/bed transfer  Transfers assist     Chair/bed transfer assist level: Independent with assistive device Chair/bed transfer assistive device: Programmer, multimedia   Ambulation assist      Assist level: Independent with assistive device Assistive device: Walker-rolling Max distance: 200   Walk 10 feet activity   Assist     Assist level: Independent with assistive device Assistive device: Walker-rolling   Walk 50 feet activity   Assist    Assist level: Independent with assistive device Assistive device: Walker-rolling    Walk 150 feet activity   Assist Walk 150 feet activity did not occur: Safety/medical concerns  Assist level: Independent with assistive device Assistive device: Walker-rolling    Walk 10 feet on uneven surface  activity   Assist Walk 10 feet on uneven surfaces  activity did not occur: Safety/medical concerns   Assist level: Supervision/Verbal cueing Assistive device: Aeronautical engineer Will patient use wheelchair at discharge?: No Type of Wheelchair: Manual    Wheelchair assist level: Supervision/Verbal cueing Max wheelchair distance: 50'    Wheelchair 50 feet with 2  turns activity    Assist        Assist Level: Supervision/Verbal cueing   Wheelchair 150 feet activity     Assist      Assist Level: Supervision/Verbal cueing   Blood pressure (!) 146/81, pulse 76, temperature 98.7 F (37.1 C), resp. rate 18, height 6' (1.829 m), weight 100.4 kg, SpO2 99 %.  Medical Problem List and Plan: 1.  Impaired mobility and ADLs s/p T9-L2 posterior spinal instrumented fusion following fall due to seizure             -patient may shower but incision site must be covered             -ELOS/Goals: 9-11 days modI in PT and OT and I in SLP             -Use thoracolumbosacral orthotic when ambulating.             Spinal precautions.   Continue CIR PT, OT 2.  Antithrombotics: -DVT/anticoagulation:  Pharmaceutical: Lovenox             -antiplatelet therapy: N/A 3. Pain Management: Now on OxyContin 10 mg bid with oxycodone prn. Consider addition of Gabapentin 300mg  TID for neuropathic pain.   3/2- will increase Oxy IR to 5-15 mg q4 hours for now- and monitor- not taking prns often.  3/3- encouraged pt to ASK for prn pain meds- hasn't been.    3/4- pain better controlled now that ASKING for prns- working much better  3/5- hurting again this AM- cannot do tramadol due to seizure dx- will increase Oxycontin to 15 mg BID  3/6: well controlled.   3/7: in pain today. Schedule Tylenol to 650 q8 for better pain control.   3/8- pt has gotten a lot of increases to pain meds, in last week- will wait to increase for now.  3/9- pt and I discussed- he says he's hurting and admits he doesn't remember when to ask- also, doesn't want to get addicted to meds- agreed not to increase dosages today- also went over how will be decreased when goes home.  3/11- pt tolerating decrease in pain meds  To 10 mg BID oxycontin and Oxy IR 5-10 mg q4 hours- will decrease again at d/c to 5 mg Oxyocdone q6 hours 4. Mood: LCSW to follow for evaluation and support. Has history of depression.               -antipsychotic agents: N/a 5. Neuropsych: This patient is capable of making decisions on his own behalf. 6. Skin/Wound Care: Monitor wound for healing.  7. Fluids/Electrolytes/Nutrition: Monitor I/O. Check lytes in am.   3/3- labs came back late yesterday- BUN 11 and Cr 1.0- K+ 3.8  3/8- BUN 10 and Cr 0.97- K+ 3.8- con't PO fluids. 8. Seizures: History of nocturnal seizures. Breakthrough seizures---patient unable to recall home dose. Now on Keppra 1000 mg bid.  3/4- no seizure activity- con't regimen  3/5- cannot add tramadol due to seizure dx'- lowers threshold.   3/9- pt notes cannot have diet drinks due ot increased risk of seizures 9. Ileus? : Abdominal distension with minimal bowel sounds. He has not had a  BM since admission. Will order KUB for work up. Start miralax bid and order enema for today  3/2- had large BM after enema today- will con't to monitor BMs closely so doesn't get impacted  3/4- started Senokot 2 tabs daily- will hold off miralax/additonal meds for now- monitor  3/8- going more regularly- almost daily. 10. Leucocytosis: Low graded fevers noted--will check UA/UCS. Monitor for signs of infection.  3/2- U/A negative for UTI- labs still pending- drawn late today.  3/3- WBC 5.7- no signs of infection currently.   11. Hyperglycemia: Elevated fasting BS--will order ac/hs cbg X 2-3 days and check Hgb A1C.   3/2- A1c is 6.7- so pt meets criteria for DM type 2- will d/w pt possible treatments- also needs to do CBGs, which has been declining intermittently.   CBG (last 3)  Recent Labs    01/21/20 1138 01/21/20 1640 01/21/20 2105  GLUCAP 107* 89 134*   3/5- fair control- will d/w pt to start Metformin?  3/7: well controlled  3/8- BGs better since stopped drinking so many cokes with every meal- will hold off on Metformin for now and monitor.   3/9- pt still drinking cokes- will get Dietary education on newly Dx'd DM- also, will wait to start metformin if changes  diet- diet is main cause base don drinking soda with every meal.   3/10- pt said doesn't want meds- ignoring [place] about BGs- willing to talk to PCP-   3/11- pt declines med- wife said pt is "sugaraholic" and eats too many sweets- likely the cause of increase A1c. Still drinking coke for meals.  12.  Chronic insomnia: Due to anxiety? Has declined sleep study. Will add xanax prn  13. Peripheral axonal neuropathy: Numbness bilateral feet and hands as well as LLE.  14. Previous constipation  3/3- added senokot 2 tabs daily to stop pt from getting backed up again. Will start tomorrow.   15. High Blood Pressure- was 157/95 this AM- is normally 120s-130s over 80s- might be due to too small cuff? Will monitor trends and treat if needed.   3/5- keeps running [id number] at night systolic- but good control during day- if doesn't improve, will need to add something small.  3/6: borderline to hypotensive this morning. Given that was elevated yesterday, continue to monitor.   3/8- BP much improved- no Hypertension in last 48 hours.   3/9- BP much improved- con't no meds  3/11- no hypotension 16. Insomnia: Added melatonin 3mg  prn.  Sleeping better 17. Poor memory- likely due to pain meds- will reduce Oxycontin to 10 mg BID and reduce oxycodone to 5-10 mg q4 hours and get rid of Oxy 15 mg fast acting- wife insisted "won't take him home" confused.   3/11- appears better today with decrease in meds-   3/12- appears good with reduction in meds- going home with more reduction of medications as detailed above.    LOS: 11 days A FACE TO FACE EVALUATION WAS PERFORMED  Benjamin [name] 01/22/2020, 9:36 AM

## 2020-01-22 NOTE — Progress Notes (Signed)
Social Work Discharge Note   The overall goal for the admission was met for:   Discharge location: Yes. D/c to home with his wife. Thre dtr lives in the home and has online college and works third shift; more PRN support, as well as neighbors.   Length of Stay: Yes. 11 days  Discharge activity level: Yes. Minimal Assistance  Home/community participation: Yes  Services provided included: MD, RD, PT, OT, SLP, RN, CM, TR, Pharmacy, Neuropsych and SW  Financial Services: Medicare and Private Insurance: Medicare A/B  And [employer]  Follow-up services arranged: Home Health: [facility] for PT/OT [phone] and DME: [facility] for RW, [facility] and [facility] [phone]  Comments (or additional information): contact pt wife [name] [phone]  Patient/Family verbalized understanding of follow-up arrangements: Yes  Individual responsible for coordination of the follow-up plan: pt wife will coordinate pt care care needs.    Confirmed correct DME delivered: Rana Snare 01/22/2020    Rana Snare

## 2020-01-22 NOTE — Discharge Instructions (Signed)
Inpatient Rehab Discharge Instructions  Benjamin [name] Discharge date and time: 01/22/20   Activities/Precautions/ Functional Status: Activity: no lifting, driving, or strenuous exercise till cleared by MD Diet: Carb Modified--limit carbs and sweets.  Wound Care: Wash with soap and water. Keep wound clean and dry. Contact MD if you develop any problems with your incision/wound--redness, swelling, increase in pain, drainage or if you develop fever or chills.    Functional status:  ___ No restrictions     ___ Walk up steps independently ___ 24/7 supervision/assistance   ___ Walk up steps with assistance _X__ Intermittent supervision/assistance  ___ Bathe/dress independently ___ Walk with walker     ___ Bathe/dress with assistance ___ Walk Independently    ___ Shower independently ___ Walk with assistance    _X__ Shower with assistance _X__ No alcohol     ___ Return to work/school ________   Special Instructions: 1. NO driving till cleared by MD.    COMMUNITY REFERRALS UPON DISCHARGE:    Home Health:   PT     OT                       Agency:[facility] Phone: [phone] *Please expect follow-up 2-3 days after your date of discharge to schedule your home visit. If you have not received follow-up, be sure to contact the branch directly.*   Medical Equipment/Items Ordered: Rolling walker, tub transfer bench, 3in1 BSC                                                 Agency/Supplier: [facility] [phone]    My questions have been answered and I understand these instructions. I will adhere to these goals and the provided educational materials after my discharge from the hospital.  Patient/Caregiver Signature _______________________________ Date __________  Clinician Signature _______________________________________ Date __________  Please bring this form and your medication list with you to all your follow-up doctor's appointments.    Carbohydrate Counting For  People With Diabetes  Foods with carbohydrates make your blood glucose level go up. Learning how to count carbohydrates can help you control your blood glucose levels. First, identify the foods you eat that contain carbohydrates. Then, using the Foods with Carbohydrates chart, determine about how much carbohydrates are in your meals and snacks. Make sure you are eating foods with fiber, protein, and healthy fat along with your carbohydrate foods. Foods with Carbohydrates The following table shows carbohydrate foods that have about 15 grams of carbohydrate each. Using measuring cups, spoons, or a food scale when you first begin learning about carbohydrate counting can help you learn about the portion sizes you typically eat. The following foods have 15 grams carbohydrate each:  Grains . 1 slice bread (1 ounce)  . 1 small tortilla (6-inch size)  .  large bagel (1 ounce)  . 1/3 cup pasta or rice (cooked)  .  hamburger or hot dog bun ( ounce)  .  cup cooked cereal  .  to  cup ready-to-eat cereal  . 2 taco shells (5-inch size) Fruit . 1 small fresh fruit ( to 1 cup)  .  medium banana  . 17 small grapes (3 ounces)  . 1 cup melon or berries  .  cup canned or frozen fruit  . 2 tablespoons dried fruit (blueberries, cherries, cranberries, raisins)  .  cup  unsweetened fruit juice  Starchy Vegetables .  cup cooked beans, peas, corn, potatoes/sweet potatoes  .  large baked potato (3 ounces)  . 1 cup acorn or butternut squash  Snack Foods . 3 to 6 crackers  . 8 potato chips or 13 tortilla chips ( ounce to 1 ounce)  . 3 cups popped popcorn  Dairy . 3/4 cup (6 ounces) nonfat plain yogurt, or yogurt with sugar-free sweetener  . 1 cup milk  . 1 cup plain rice, soy, coconut or flavored almond milk Sweets and Desserts .  cup ice cream or frozen yogurt  . 1 tablespoon jam, jelly, pancake syrup, table sugar, or honey  . 2 tablespoons light pancake syrup  . 1 inch square of frosted cake or  2 inch square of unfrosted cake  . 2 small cookies (2/3 ounce each) or  large cookie  Sometimes you'll have to estimate carbohydrate amounts if you don't know the exact recipe. One cup of mixed foods like soups can have 1 to 2 carbohydrate servings, while some casseroles might have 2 or more servings of carbohydrate. Foods that have less than 20 calories in each serving can be counted as "free" foods. Count 1 cup raw vegetables, or  cup cooked non-starchy vegetables as "free" foods. If you eat 3 or more servings at one meal, then count them as 1 carbohydrate serving.  Foods without Carbohydrates  Not all foods contain carbohydrates. Meat, some dairy, fats, non-starchy vegetables, and many beverages don't contain carbohydrate. So when you count carbohydrates, you can generally exclude chicken, pork, beef, fish, seafood, eggs, tofu, cheese, butter, sour cream, avocado, nuts, seeds, olives, mayonnaise, water, black coffee, unsweetened tea, and zero-calorie drinks. Vegetables with no or low carbohydrate include green beans, cauliflower, tomatoes, and onions. How much carbohydrate should I eat at each meal?  Carbohydrate counting can help you plan your meals and manage your weight. Following are some starting points for carbohydrate intake at each meal. Work with your registered dietitian nutritionist to find the best range that works for your blood glucose and weight.   To Lose Weight To Maintain Weight  Women 2 - 3 carb servings 3 - 4 carb servings  Men 3 - 4 carb servings 4 - 5 carb servings  Checking your blood glucose after meals will help you know if you need to adjust the timing, type, or number of carbohydrate servings in your meal plan. Achieve and keep a healthy body weight by balancing your food intake and physical activity.  Tips How should I plan my meals?  Plan for half the food on your plate to include non-starchy vegetables, like salad greens, broccoli, or carrots. Try to eat 3 to 5  servings of non-starchy vegetables every day. Have a protein food at each meal. Protein foods include chicken, fish, meat, eggs, or beans (note that beans contain carbohydrate). These two food groups (non-starchy vegetables and proteins) are low in carbohydrate. If you fill up your plate with these foods, you will eat less carbohydrate but still fill up your stomach. Try to limit your carbohydrate portion to  of the plate.  What fats are healthiest to eat?  Diabetes increases risk for heart disease. To help protect your heart, eat more healthy fats, such as olive oil, nuts, and avocado. Eat less saturated fats like butter, cream, and high-fat meats, like bacon and sausage. Avoid trans fats, which are in all foods that list "partially hydrogenated oil" as an ingredient. What should I drink?  Choose drinks that are not sweetened with sugar. The healthiest choices are water, carbonated or seltzer waters, and tea and coffee without added sugars.  Sweet drinks will make your blood glucose go up very quickly. One serving of soda or energy drink is  cup. It is best to drink these beverages only if your blood glucose is low.  Artificially sweetened, or diet drinks, typically do not increase your blood glucose if they have zero calories in them. Read labels of beverages, as some diet drinks do have carbohydrate and will raise your blood glucose. Label Reading Tips Read Nutrition Facts labels to find out how many grams of carbohydrate are in a food you want to eat. Don't forget: sometimes serving sizes on the label aren't the same as how much food you are going to eat, so you may need to calculate how much carbohydrate is in the food you are serving yourself.   Carbohydrate Counting for People with Diabetes Sample 1-Day Menu  Breakfast  cup yogurt, low fat, low sugar (1 carbohydrate serving)   cup cereal, ready-to-eat, unsweetened (1 carbohydrate serving)  1 cup strawberries (1 carbohydrate serving)   cup  almonds ( carbohydrate serving)  Lunch 1, 5 ounce can chunk light tuna  2 ounces cheese, low fat cheddar  6 whole wheat crackers (1 carbohydrate serving)  1 small apple (1 carbohydrate servings)   cup carrots ( carbohydrate serving)   cup snap peas  1 cup 1% milk (1 carbohydrate serving)   Evening Meal Stir fry made with: 3 ounces chicken  1 cup brown rice (3 carbohydrate servings)   cup broccoli ( carbohydrate serving)   cup green beans   cup onions  1 tablespoon olive oil  2 tablespoons teriyaki sauce ( carbohydrate serving)  Evening Snack 1 extra small banana (1 carbohydrate serving)  1 tablespoon peanut butter   Carbohydrate Counting for People with Diabetes Vegan Sample 1-Day Menu  Breakfast 1 cup cooked oatmeal (2 carbohydrate servings)   cup blueberries (1 carbohydrate serving)  2 tablespoons flaxseeds  1 cup soymilk fortified with calcium and vitamin D  1 cup coffee  Lunch 2 slices whole wheat bread (2 carbohydrate servings)   cup baked tofu   cup lettuce  2 slices tomato  2 slices avocado   cup baby carrots ( carbohydrate serving)  1 orange (1 carbohydrate serving)  1 cup soymilk fortified with calcium and vitamin D   Evening Meal Burrito made with: 1 6-inch corn tortilla (1 carbohydrate serving)  1 cup refried vegetarian beans (2 carbohydrate servings)   cup chopped tomatoes   cup lettuce   cup salsa  1/3 cup brown rice (1 carbohydrate serving)  1 tablespoon olive oil for rice   cup zucchini   Evening Snack 6 small whole grain crackers (1 carbohydrate serving)  2 apricots ( carbohydrate serving)   cup unsalted peanuts ( carbohydrate serving)    Carbohydrate Counting for People with Diabetes Vegetarian (Lacto-Ovo) Sample 1-Day Menu  Breakfast 1 cup cooked oatmeal (2 carbohydrate servings)   cup blueberries (1 carbohydrate serving)  2 tablespoons flaxseeds  1 egg  1 cup 1% milk (1 carbohydrate serving)  1 cup coffee  Lunch 2 slices  whole wheat bread (2 carbohydrate servings)  2 ounces low-fat cheese   cup lettuce  2 slices tomato  2 slices avocado   cup baby carrots ( carbohydrate serving)  1 orange (1 carbohydrate serving)  1 cup unsweetened tea  Evening Meal Burrito made with: 1 6-inch  corn tortilla (1 carbohydrate serving)   cup refried vegetarian beans (1 carbohydrate serving)   cup tomatoes   cup lettuce   cup salsa  1/3 cup brown rice (1 carbohydrate serving)  1 tablespoon olive oil for rice   cup zucchini  1 cup 1% milk (1 carbohydrate serving)  Evening Snack 6 small whole grain crackers (1 carbohydrate serving)  2 apricots ( carbohydrate serving)   cup unsalted peanuts ( carbohydrate serving)    Copyright 2020  Academy of Nutrition and Dietetics. All rights reserved.  Using Nutrition Labels: Carbohydrate  . Serving Size  . Look at the serving size. All the information on the label is based on this portion. Randol Kern Per Container  . The number of servings contained in the package. . Guidelines for Carbohydrate  . Look at the total grams of carbohydrate in the serving size.  . 1 carbohydrate choice = 15 grams of carbohydrate. Range of Carbohydrate Grams Per Choice  Carbohydrate Grams/Choice Carbohydrate Choices  6-10   11-20 1  21-25 1  26-35 2  36-40 2  41-50 3  51-55 3  56-65 4  66-70 4  71-80 5    Copyright 2020  Academy of Nutrition and Dietetics. All rights reserved.

## 2020-01-22 NOTE — Progress Notes (Addendum)
Patient discharged home with his wife. Patient's belongings were packed and patient discharged with home equipment. Provider discussed discharge instructions with patient and his wife.

## 2020-01-22 NOTE — Plan of Care (Signed)
  Problem: Consults Goal: RH SPINAL CORD INJURY PATIENT EDUCATION Description:  See Patient Education module for education specifics.  Outcome: Completed/Met Goal: Skin Care Protocol Initiated - if Braden Score 18 or less Description: If consults are not indicated, leave blank or document N/A Outcome: Completed/Met Goal: Nutrition Consult-if indicated Outcome: Completed/Met Goal: Diabetes Guidelines if Diabetic/Glucose > 140 Description: If diabetic or lab glucose is > 140 mg/dl - Initiate Diabetes/Hyperglycemia Guidelines & Document Interventions  Outcome: Completed/Met   Problem: SCI BOWEL ELIMINATION Goal: RH STG MANAGE BOWEL WITH ASSISTANCE Description: STG Manage Bowel with mod I Assistance. Outcome: Completed/Met Goal: RH STG SCI MANAGE BOWEL WITH MEDICATION WITH ASSISTANCE Description: STG SCI Manage bowel with medication with mod I assistance. Outcome: Completed/Met Goal: RH STG MANAGE BOWEL W/EQUIPMENT W/ASSISTANCE Description: STG Manage Bowel With Equipment With Assistance Outcome: Completed/Met Goal: RH STG SCI MANAGE BOWEL PROGRAM W/ASSIST OR AS APPROPRIATE Description: STG SCI Manage bowel program w/assist or as appropriate. Outcome: Completed/Met Goal: RH OTHER STG BOWEL ELIMINATION GOALS W/ASSIST Description: Other STG Bowel Elimination Goals With Assistance. Outcome: Completed/Met   Problem: SCI BLADDER ELIMINATION Goal: RH STG MANAGE BLADDER WITH ASSISTANCE Description: STG Manage Bladder With mod I Assistance Outcome: Completed/Met Goal: RH STG MANAGE BLADDER WITH MEDICATION WITH ASSISTANCE Description: STG Manage Bladder With Medication With mod I Assistance. Outcome: Completed/Met Goal: RH STG MANAGE BLADDER WITH EQUIPMENT WITH ASSISTANCE Description: STG Manage Bladder With Equipment With Assistance Outcome: Completed/Met Goal: RH STG SCI MANAGE BLADDER PROGRAM W/ASSISTANCE Outcome: Completed/Met Goal: RH OTHER STG BLADDER ELIMINATION GOALS  W/ASSIST Description: Other STG Bladder Elimination Goals With Assistance Outcome: Completed/Met   Problem: RH SKIN INTEGRITY Goal: RH STG SKIN FREE OF INFECTION/BREAKDOWN Outcome: Completed/Met Goal: RH STG MAINTAIN SKIN INTEGRITY WITH ASSISTANCE Description: STG Maintain Skin Integrity With mod I Assistance. Outcome: Completed/Met Goal: RH STG ABLE TO PERFORM INCISION/WOUND CARE W/ASSISTANCE Description: STG Able To Perform Incision/Wound Care With Assistance. Outcome: Completed/Met   Problem: RH SAFETY Goal: RH STG ADHERE TO SAFETY PRECAUTIONS W/ASSISTANCE/DEVICE Description: STG Adhere to Safety Precautions With Assistance/Device. Outcome: Completed/Met Goal: RH STG DECREASED RISK OF FALL WITH ASSISTANCE Description: STG Decreased Risk of Fall With mod I Assistance. Outcome: Completed/Met   Problem: RH PAIN MANAGEMENT Goal: RH STG PAIN MANAGED AT OR BELOW PT'S PAIN GOAL Description: Pain scale less than 5/10 Outcome: Completed/Met   Problem: RH KNOWLEDGE DEFICIT SCI Goal: RH STG INCREASE KNOWLEDGE OF SELF CARE AFTER SCI Outcome: Completed/Met

## 2020-01-25 DIAGNOSIS — N139 Obstructive and reflux uropathy, unspecified: Secondary | ICD-10-CM | POA: Diagnosis not present

## 2020-01-25 DIAGNOSIS — G6289 Other specified polyneuropathies: Secondary | ICD-10-CM | POA: Diagnosis not present

## 2020-01-25 DIAGNOSIS — R32 Unspecified urinary incontinence: Secondary | ICD-10-CM | POA: Diagnosis not present

## 2020-01-25 DIAGNOSIS — G40802 Other epilepsy, not intractable, without status epilepticus: Secondary | ICD-10-CM | POA: Diagnosis not present

## 2020-01-25 DIAGNOSIS — S32012D Unstable burst fracture of first lumbar vertebra, subsequent encounter for fracture with routine healing: Secondary | ICD-10-CM | POA: Diagnosis not present

## 2020-01-25 DIAGNOSIS — Z981 Arthrodesis status: Secondary | ICD-10-CM | POA: Diagnosis not present

## 2020-01-25 DIAGNOSIS — S22089D Unspecified fracture of T11-T12 vertebra, subsequent encounter for fracture with routine healing: Secondary | ICD-10-CM | POA: Diagnosis not present

## 2020-01-25 DIAGNOSIS — K5900 Constipation, unspecified: Secondary | ICD-10-CM | POA: Diagnosis not present

## 2020-01-25 DIAGNOSIS — F419 Anxiety disorder, unspecified: Secondary | ICD-10-CM | POA: Diagnosis not present

## 2020-01-25 DIAGNOSIS — M48061 Spinal stenosis, lumbar region without neurogenic claudication: Secondary | ICD-10-CM | POA: Diagnosis not present

## 2020-01-25 DIAGNOSIS — N401 Enlarged prostate with lower urinary tract symptoms: Secondary | ICD-10-CM | POA: Diagnosis not present

## 2020-01-25 DIAGNOSIS — M47816 Spondylosis without myelopathy or radiculopathy, lumbar region: Secondary | ICD-10-CM | POA: Diagnosis not present

## 2020-01-25 DIAGNOSIS — Z79891 Long term (current) use of opiate analgesic: Secondary | ICD-10-CM | POA: Diagnosis not present

## 2020-01-25 DIAGNOSIS — Z9181 History of falling: Secondary | ICD-10-CM | POA: Diagnosis not present

## 2020-01-25 DIAGNOSIS — G4700 Insomnia, unspecified: Secondary | ICD-10-CM | POA: Diagnosis not present

## 2020-01-25 DIAGNOSIS — Z8546 Personal history of malignant neoplasm of prostate: Secondary | ICD-10-CM | POA: Diagnosis not present

## 2020-01-25 DIAGNOSIS — W11XXXD Fall on and from ladder, subsequent encounter: Secondary | ICD-10-CM | POA: Diagnosis not present

## 2020-01-25 DIAGNOSIS — M4810 Ankylosing hyperostosis [Forestier], site unspecified: Secondary | ICD-10-CM | POA: Diagnosis not present

## 2020-01-25 DIAGNOSIS — S32032D Unstable burst fracture of third lumbar vertebra, subsequent encounter for fracture with routine healing: Secondary | ICD-10-CM | POA: Diagnosis not present

## 2020-01-25 DIAGNOSIS — E1165 Type 2 diabetes mellitus with hyperglycemia: Secondary | ICD-10-CM | POA: Diagnosis not present

## 2020-01-25 DIAGNOSIS — F329 Major depressive disorder, single episode, unspecified: Secondary | ICD-10-CM | POA: Diagnosis not present

## 2020-01-25 DIAGNOSIS — E7800 Pure hypercholesterolemia, unspecified: Secondary | ICD-10-CM | POA: Diagnosis not present

## 2020-01-25 DIAGNOSIS — S32022D Unstable burst fracture of second lumbar vertebra, subsequent encounter for fracture with routine healing: Secondary | ICD-10-CM | POA: Diagnosis not present

## 2020-01-26 ENCOUNTER — Telehealth: Payer: Self-pay

## 2020-01-26 NOTE — Telephone Encounter (Signed)
Transitional Care call--Patient    1. Are you/is patient experiencing any problems since coming home? No Are there any questions regarding any aspect of care? No 2. Are there any questions regarding medications administration/dosing? No Are meds being taken as prescribed?  Yes Patient should review meds with caller to confirm 3. Have there been any falls? No 4. Has Home Health been to the house and/or have they contacted you? Yes If not, have you tried to contact them? Can we help you contact them? 5. Are bowels and bladder emptying properly? Yes Are there any unexpected incontinence issues? No If applicable, is patient following bowel/bladder programs? 6. Any fevers, problems with breathing, unexpected pain? No 7. Are there any skin problems or new areas of breakdown? No 8. Has the patient/family member arranged specialty MD follow up (ie cardiology/neurology/renal/surgical/etc)? Yes Can we help arrange? 9. Does the patient need any other services or support that we can help arrange? No 10. Are caregivers following through as expected in assisting the patient? Yes 11. Has the patient quit smoking, drinking alcohol, or using drugs as recommended? Yes. Never did  Appointment time 2:00 pm , arrive time 1:45 pm with Dr. Dagoberto Ligas [address]

## 2020-01-28 DIAGNOSIS — G40909 Epilepsy, unspecified, not intractable, without status epilepticus: Secondary | ICD-10-CM | POA: Diagnosis not present

## 2020-01-28 DIAGNOSIS — S22089A Unspecified fracture of T11-T12 vertebra, initial encounter for closed fracture: Secondary | ICD-10-CM | POA: Diagnosis not present

## 2020-01-28 DIAGNOSIS — Z79899 Other long term (current) drug therapy: Secondary | ICD-10-CM | POA: Diagnosis not present

## 2020-01-28 DIAGNOSIS — D72829 Elevated white blood cell count, unspecified: Secondary | ICD-10-CM | POA: Diagnosis not present

## 2020-01-28 DIAGNOSIS — R739 Hyperglycemia, unspecified: Secondary | ICD-10-CM | POA: Diagnosis not present

## 2020-01-28 DIAGNOSIS — G4700 Insomnia, unspecified: Secondary | ICD-10-CM | POA: Diagnosis not present

## 2020-01-29 DIAGNOSIS — S22089D Unspecified fracture of T11-T12 vertebra, subsequent encounter for fracture with routine healing: Secondary | ICD-10-CM | POA: Diagnosis not present

## 2020-01-29 DIAGNOSIS — E1165 Type 2 diabetes mellitus with hyperglycemia: Secondary | ICD-10-CM | POA: Diagnosis not present

## 2020-01-29 DIAGNOSIS — S32022D Unstable burst fracture of second lumbar vertebra, subsequent encounter for fracture with routine healing: Secondary | ICD-10-CM | POA: Diagnosis not present

## 2020-01-29 DIAGNOSIS — S32032D Unstable burst fracture of third lumbar vertebra, subsequent encounter for fracture with routine healing: Secondary | ICD-10-CM | POA: Diagnosis not present

## 2020-01-29 DIAGNOSIS — G40802 Other epilepsy, not intractable, without status epilepticus: Secondary | ICD-10-CM | POA: Diagnosis not present

## 2020-01-29 DIAGNOSIS — S32012D Unstable burst fracture of first lumbar vertebra, subsequent encounter for fracture with routine healing: Secondary | ICD-10-CM | POA: Diagnosis not present

## 2020-02-01 ENCOUNTER — Encounter: Payer: 59 | Admitting: Physical Medicine and Rehabilitation

## 2020-02-01 DIAGNOSIS — E1165 Type 2 diabetes mellitus with hyperglycemia: Secondary | ICD-10-CM | POA: Diagnosis not present

## 2020-02-01 DIAGNOSIS — S22089D Unspecified fracture of T11-T12 vertebra, subsequent encounter for fracture with routine healing: Secondary | ICD-10-CM | POA: Diagnosis not present

## 2020-02-01 DIAGNOSIS — S32012D Unstable burst fracture of first lumbar vertebra, subsequent encounter for fracture with routine healing: Secondary | ICD-10-CM | POA: Diagnosis not present

## 2020-02-01 DIAGNOSIS — S32022D Unstable burst fracture of second lumbar vertebra, subsequent encounter for fracture with routine healing: Secondary | ICD-10-CM | POA: Diagnosis not present

## 2020-02-01 DIAGNOSIS — S32032D Unstable burst fracture of third lumbar vertebra, subsequent encounter for fracture with routine healing: Secondary | ICD-10-CM | POA: Diagnosis not present

## 2020-02-01 DIAGNOSIS — G40802 Other epilepsy, not intractable, without status epilepticus: Secondary | ICD-10-CM | POA: Diagnosis not present

## 2020-02-02 DIAGNOSIS — Z20822 Contact with and (suspected) exposure to covid-19: Secondary | ICD-10-CM | POA: Diagnosis not present

## 2020-02-02 DIAGNOSIS — Z20828 Contact with and (suspected) exposure to other viral communicable diseases: Secondary | ICD-10-CM | POA: Diagnosis not present

## 2020-02-02 DIAGNOSIS — Z309 Encounter for contraceptive management, unspecified: Secondary | ICD-10-CM | POA: Diagnosis not present

## 2020-02-03 ENCOUNTER — Other Ambulatory Visit: Payer: Self-pay

## 2020-02-03 ENCOUNTER — Encounter: Payer: Self-pay | Admitting: Physical Medicine and Rehabilitation

## 2020-02-03 ENCOUNTER — Encounter: Payer: 59 | Attending: Physical Medicine and Rehabilitation | Admitting: Physical Medicine and Rehabilitation

## 2020-02-03 VITALS — Ht 72.0 in | Wt 217.0 lb

## 2020-02-03 DIAGNOSIS — E119 Type 2 diabetes mellitus without complications: Secondary | ICD-10-CM

## 2020-02-03 DIAGNOSIS — Z981 Arthrodesis status: Secondary | ICD-10-CM

## 2020-02-03 DIAGNOSIS — S22088K Other fracture of T11-T12 vertebra, subsequent encounter for fracture with nonunion: Secondary | ICD-10-CM | POA: Diagnosis not present

## 2020-02-03 NOTE — Patient Instructions (Signed)
Patient is a 69 yr old male with hx of seizures and vertebral compression fx in thoracic spine- s/p T9-L2 fusion - in hospital, A1c was up to 6.7 (so met criteria for DM), and also had insomnia- here on webex for hospital f/u.  1. Pt to continue H/H PT for 8 more weeks- will make sure signed for.  2. If needs outpatient PT after H/H, happy to write for that therapy order/referral. Can also be done by [facility].  3. Pain controlled with Celebrex and tylenol most of the time- off Oxycodone- doesn't need refills of pain meds.   4. DM- under better control per pt/wife- con't diet control. Cutting out sweets/carbs  5. F/U as needed- since doing so well.

## 2020-02-03 NOTE — Progress Notes (Signed)
Subjective:    Patient ID: Benjamin [name], male    DOB: August 13, 1951, 69 y.o.   MRN: [id number]  HPI    Due to national recommendations of social distancing because of COVID 48, an audio/video tele-health visit is felt to be the most appropriate encounter for this patient at this time. See MyChart message from today for the patient's consent to a tele-health encounter with [facility]. This is a follow up tele-visit via Webex. The patient is at home. MD is at office.   Patient is a 69 yr old male with hx of seizures and vertebral compression fx in thoracic spine- s/p T9-L2 fusion - in hospital, A1c was up to 6.7 (so met criteria for DM), and also had insomnia- here on webex for hospital f/u.   Pt reports sleeping much bette-r not requiring meds for sleep anymore- his PCP switched him to tylenol and Celebrex for pain- today is last day of oxycodone 5 mg- and off oxycontin as well.   Doing well.  Getting H/H 2x/day PT- no OT needed- plan for PT for 3weeks and 1x/week for 5 weeks then done.   Doesn't think will need therapy after that, but can have [facility] write for oupt PT if necessary.   Doing HEP daily that was given by PT.   Took a shower with shower bench- wife helped- went well with no issues- being safe and conservative- has grab bars and rails aorund house that he uses.   No falls- using RW except occ in compact house, will furniture walk, but never without holding on.  Has walked to mailbox with RW 20 yards and back.   Moved over to Dr Zada Finders- Dr Kathyrn Sheriff is retiring per pt.  Also saw PCP- A1c was lower per pt- didn't have number- and is going to do diet controlled for now- has cut out a lot of sweets and carbs.   Little sor ein back, but "to be expected". Also changes around which chair he sits in to stay more comfortable.   Wife says she's making sure he's safe.     Pain Inventory Average Pain 5 Pain Right Now 4 My pain is aching  In  the last 24 hours, has pain interfered with the following? General activity 4 Relation with others 4 Enjoyment of life 4 What TIME of day is your pain at its worst? morning Sleep (in general) Good  Pain is worse with: some activites Pain improves with: therapy/exercise and medication Relief from Meds: 8  Mobility use a walker how many minutes can you walk? 10-15 ability to climb steps?  yes do you drive?  no  Function retired I need assistance with the following:  bathing and meal prep  Neuro/Psych trouble walking  Prior Studies Any changes since last visit?  no  Physicians involved in your care Primary care . Surgeon   Family History  Problem Relation Age of Onset  . Cancer Father 33       prostate/prostatectomy  . Cancer Cousin 55       prostate ca, also heart problems   Social History   Socioeconomic History  . Marital status: Married    Spouse name: [name]  . Number of children: 1  . Years of education: Asso x 2  . Highest education level: Not on file  Occupational History    Employer: [employer]  Tobacco Use  . Smoking status: Never Smoker  . Smokeless tobacco: Never Used  Substance  and Sexual Activity  . Alcohol use: Yes    Comment: social beer occasionally  . Drug use: No    Comment: chewed in high school short time  . Sexual activity: Yes  Other Topics Concern  . Not on file  Social History Narrative   Patient is married [name]) and lives at home with his wife and 1 child and a step-child.   Patient is working full-time.   Patient has two Associate degrees.   Patient is right-handed.   Patient drinks four cups of tea daily.   Social Determinants of Health   Financial Resource Strain:   . Difficulty of Paying Living Expenses:   Food Insecurity:   . Worried About Charity fundraiser in the Last Year:   . Arboriculturist in the Last Year:   Transportation Needs:   . Film/video editor (Medical):   Marland Kitchen Lack of Transportation  (Non-Medical):   Physical Activity:   . Days of Exercise per Week:   . Minutes of Exercise per Session:   Stress:   . Feeling of Stress :   Social Connections:   . Frequency of Communication with Friends and Family:   . Frequency of Social Gatherings with Friends and Family:   . Attends Religious Services:   . Active Member of Clubs or Organizations:   . Attends Archivist Meetings:   Marland Kitchen Marital Status:    Past Surgical History:  Procedure Laterality Date  . BACK SURGERY    . COLONOSCOPY    . KNEE ARTHROSCOPY  2017  . LUMBAR LAMINECTOMY/DECOMPRESSION MICRODISCECTOMY N/A 12/23/2013   Procedure: Lumbar Laminectomy Decompression, Lumbar Two, Three, Four, Five;  Surgeon: Hosie Spangle, MD;  Location: [facility];  Service: Neurosurgery;  Laterality: N/A;  . LYMPHADENECTOMY Bilateral 05/25/2016   Procedure: BILATERAL PELVIC LYMPHADENECTOMY;  Surgeon: Alexis Frock, MD;  Location: [facility];  Service: Urology;  Laterality: Bilateral;  . POSTERIOR LUMBAR FUSION 4 LEVEL N/A 01/08/2020   Procedure: THORACIC NINE TO LUMBAR TWO POSTERIOR SPINAL INSTRUMENTED FUSION;  Surgeon: Judith Part, MD;  Location: [facility];  Service: Neurosurgery;  Laterality: N/A;  . ROBOT ASSISTED LAPAROSCOPIC RADICAL PROSTATECTOMY N/A 05/25/2016   Procedure: XI ROBOTIC ASSISTED LAPAROSCOPIC RADICAL PROSTATECTOMY WITH INDOCYANINE GREEN DYE;  Surgeon: Alexis Frock, MD;  Location: [facility];  Service: Urology;  Laterality: N/A;  . TOE SURGERY     Past Medical History:  Diagnosis Date  . Acute prostatitis 07/03/12   s/p prostate   . Allergy   . Anxiety   . Arthritis    stenosis lower back  . Back pain   . BPH (benign prostatic hypertrophy) with urinary obstruction   . Depression   . ED (erectile dysfunction)   . Headache(784.0)    "sinus" headaches  . History of ITP    as a child per patient  . Hypercholesterolemia   . Other forms of epilepsy and recurrent seizures without mention of intractable  epilepsy 09/16/2013  . Peripheral axonal neuropathy 08/06/2014  . Prostate cancer ([place]) 07/03/12   Adenocarcinoma,gleason=3+3=6,PSA=5.0,volume=20cc  . Seizures ([place])   . Unspecified hereditary and idiopathic peripheral neuropathy 07/07/2014   Foot numbness starting 2010, hands beginning in 2014- 15 .   Ht 6' (1.829 m)   Wt 217 lb (98.4 kg)   BMI 29.43 kg/m   Opioid Risk Score:   Fall Risk Score:  `1  Depression screen PHQ 2/9  Depression screen PHQ 2/9 02/03/2020  Decreased Interest 0  Down,  Depressed, Hopeless 0  PHQ - 2 Score 0    Review of Systems  Musculoskeletal: Positive for back pain.       Objective:   Physical Exam Webex sitting up in rocking chair, rocking back and forth, NAD Appears much more appropriate, more attentive and listening and interactive more.  Wife at side      Assessment & Plan:   Patient is a 69 yr old male with hx of seizures and vertebral compression fx in thoracic spine- s/p T9-L2 fusion - in hospital, A1c was up to 6.7 (so met criteria for DM), and also had insomnia- here on webex for hospital f/u.  1. Pt to continue H/H PT for 8 more weeks- will make sure signed for.  2. If needs outpatient PT after H/H, happy to write for that therapy order/referral. Can also be done by [facility].  3. Pain controlled with Celebrex and tylenol most of the time- off Oxycodone- doesn't need refills of pain meds.   4. DM- under better control per pt/wife- con't diet control. Cutting out sweets/carbs  5. F/U as needed- since doing so well.   I spent a total of 20 minutes on appointment- more than 10 minutes discussing pain control and how to control and DM and how to keep BGs controlled.

## 2020-02-05 DIAGNOSIS — S32022D Unstable burst fracture of second lumbar vertebra, subsequent encounter for fracture with routine healing: Secondary | ICD-10-CM | POA: Diagnosis not present

## 2020-02-05 DIAGNOSIS — S22089D Unspecified fracture of T11-T12 vertebra, subsequent encounter for fracture with routine healing: Secondary | ICD-10-CM | POA: Diagnosis not present

## 2020-02-05 DIAGNOSIS — S32012D Unstable burst fracture of first lumbar vertebra, subsequent encounter for fracture with routine healing: Secondary | ICD-10-CM | POA: Diagnosis not present

## 2020-02-05 DIAGNOSIS — E1165 Type 2 diabetes mellitus with hyperglycemia: Secondary | ICD-10-CM | POA: Diagnosis not present

## 2020-02-05 DIAGNOSIS — G40802 Other epilepsy, not intractable, without status epilepticus: Secondary | ICD-10-CM | POA: Diagnosis not present

## 2020-02-05 DIAGNOSIS — S32032D Unstable burst fracture of third lumbar vertebra, subsequent encounter for fracture with routine healing: Secondary | ICD-10-CM | POA: Diagnosis not present

## 2020-02-08 DIAGNOSIS — S32032D Unstable burst fracture of third lumbar vertebra, subsequent encounter for fracture with routine healing: Secondary | ICD-10-CM | POA: Diagnosis not present

## 2020-02-08 DIAGNOSIS — S22089D Unspecified fracture of T11-T12 vertebra, subsequent encounter for fracture with routine healing: Secondary | ICD-10-CM | POA: Diagnosis not present

## 2020-02-08 DIAGNOSIS — G40802 Other epilepsy, not intractable, without status epilepticus: Secondary | ICD-10-CM | POA: Diagnosis not present

## 2020-02-08 DIAGNOSIS — S32022D Unstable burst fracture of second lumbar vertebra, subsequent encounter for fracture with routine healing: Secondary | ICD-10-CM | POA: Diagnosis not present

## 2020-02-08 DIAGNOSIS — E1165 Type 2 diabetes mellitus with hyperglycemia: Secondary | ICD-10-CM | POA: Diagnosis not present

## 2020-02-08 DIAGNOSIS — S32012D Unstable burst fracture of first lumbar vertebra, subsequent encounter for fracture with routine healing: Secondary | ICD-10-CM | POA: Diagnosis not present

## 2020-02-11 DIAGNOSIS — S32022D Unstable burst fracture of second lumbar vertebra, subsequent encounter for fracture with routine healing: Secondary | ICD-10-CM | POA: Diagnosis not present

## 2020-02-11 DIAGNOSIS — G40802 Other epilepsy, not intractable, without status epilepticus: Secondary | ICD-10-CM | POA: Diagnosis not present

## 2020-02-11 DIAGNOSIS — E1165 Type 2 diabetes mellitus with hyperglycemia: Secondary | ICD-10-CM | POA: Diagnosis not present

## 2020-02-11 DIAGNOSIS — S32012D Unstable burst fracture of first lumbar vertebra, subsequent encounter for fracture with routine healing: Secondary | ICD-10-CM | POA: Diagnosis not present

## 2020-02-11 DIAGNOSIS — S22089D Unspecified fracture of T11-T12 vertebra, subsequent encounter for fracture with routine healing: Secondary | ICD-10-CM | POA: Diagnosis not present

## 2020-02-11 DIAGNOSIS — S32032D Unstable burst fracture of third lumbar vertebra, subsequent encounter for fracture with routine healing: Secondary | ICD-10-CM | POA: Diagnosis not present

## 2020-02-13 ENCOUNTER — Other Ambulatory Visit: Payer: Self-pay | Admitting: Physical Medicine and Rehabilitation

## 2020-02-15 DIAGNOSIS — G40802 Other epilepsy, not intractable, without status epilepticus: Secondary | ICD-10-CM | POA: Diagnosis not present

## 2020-02-15 DIAGNOSIS — E1165 Type 2 diabetes mellitus with hyperglycemia: Secondary | ICD-10-CM | POA: Diagnosis not present

## 2020-02-15 DIAGNOSIS — S32032D Unstable burst fracture of third lumbar vertebra, subsequent encounter for fracture with routine healing: Secondary | ICD-10-CM | POA: Diagnosis not present

## 2020-02-15 DIAGNOSIS — S22089D Unspecified fracture of T11-T12 vertebra, subsequent encounter for fracture with routine healing: Secondary | ICD-10-CM | POA: Diagnosis not present

## 2020-02-15 DIAGNOSIS — S32022D Unstable burst fracture of second lumbar vertebra, subsequent encounter for fracture with routine healing: Secondary | ICD-10-CM | POA: Diagnosis not present

## 2020-02-15 DIAGNOSIS — S32012D Unstable burst fracture of first lumbar vertebra, subsequent encounter for fracture with routine healing: Secondary | ICD-10-CM | POA: Diagnosis not present

## 2020-02-16 ENCOUNTER — Ambulatory Visit: Payer: 59 | Admitting: Adult Health

## 2020-02-17 DIAGNOSIS — G40802 Other epilepsy, not intractable, without status epilepticus: Secondary | ICD-10-CM | POA: Diagnosis not present

## 2020-02-17 DIAGNOSIS — S32032D Unstable burst fracture of third lumbar vertebra, subsequent encounter for fracture with routine healing: Secondary | ICD-10-CM | POA: Diagnosis not present

## 2020-02-17 DIAGNOSIS — S22089D Unspecified fracture of T11-T12 vertebra, subsequent encounter for fracture with routine healing: Secondary | ICD-10-CM | POA: Diagnosis not present

## 2020-02-17 DIAGNOSIS — S32012D Unstable burst fracture of first lumbar vertebra, subsequent encounter for fracture with routine healing: Secondary | ICD-10-CM | POA: Diagnosis not present

## 2020-02-17 DIAGNOSIS — S32022D Unstable burst fracture of second lumbar vertebra, subsequent encounter for fracture with routine healing: Secondary | ICD-10-CM | POA: Diagnosis not present

## 2020-02-17 DIAGNOSIS — E1165 Type 2 diabetes mellitus with hyperglycemia: Secondary | ICD-10-CM | POA: Diagnosis not present

## 2020-02-18 DIAGNOSIS — S32032D Unstable burst fracture of third lumbar vertebra, subsequent encounter for fracture with routine healing: Secondary | ICD-10-CM | POA: Diagnosis not present

## 2020-02-18 DIAGNOSIS — G40802 Other epilepsy, not intractable, without status epilepticus: Secondary | ICD-10-CM | POA: Diagnosis not present

## 2020-02-18 DIAGNOSIS — E1165 Type 2 diabetes mellitus with hyperglycemia: Secondary | ICD-10-CM | POA: Diagnosis not present

## 2020-02-18 DIAGNOSIS — S32022D Unstable burst fracture of second lumbar vertebra, subsequent encounter for fracture with routine healing: Secondary | ICD-10-CM | POA: Diagnosis not present

## 2020-02-18 DIAGNOSIS — S32012D Unstable burst fracture of first lumbar vertebra, subsequent encounter for fracture with routine healing: Secondary | ICD-10-CM | POA: Diagnosis not present

## 2020-02-18 DIAGNOSIS — S22089D Unspecified fracture of T11-T12 vertebra, subsequent encounter for fracture with routine healing: Secondary | ICD-10-CM | POA: Diagnosis not present

## 2020-02-22 DIAGNOSIS — S32032D Unstable burst fracture of third lumbar vertebra, subsequent encounter for fracture with routine healing: Secondary | ICD-10-CM | POA: Diagnosis not present

## 2020-02-22 DIAGNOSIS — G40802 Other epilepsy, not intractable, without status epilepticus: Secondary | ICD-10-CM | POA: Diagnosis not present

## 2020-02-22 DIAGNOSIS — S22089D Unspecified fracture of T11-T12 vertebra, subsequent encounter for fracture with routine healing: Secondary | ICD-10-CM | POA: Diagnosis not present

## 2020-02-22 DIAGNOSIS — S32022D Unstable burst fracture of second lumbar vertebra, subsequent encounter for fracture with routine healing: Secondary | ICD-10-CM | POA: Diagnosis not present

## 2020-02-22 DIAGNOSIS — E1165 Type 2 diabetes mellitus with hyperglycemia: Secondary | ICD-10-CM | POA: Diagnosis not present

## 2020-02-22 DIAGNOSIS — S32012D Unstable burst fracture of first lumbar vertebra, subsequent encounter for fracture with routine healing: Secondary | ICD-10-CM | POA: Diagnosis not present

## 2020-02-23 DIAGNOSIS — G40802 Other epilepsy, not intractable, without status epilepticus: Secondary | ICD-10-CM | POA: Diagnosis not present

## 2020-02-23 DIAGNOSIS — S32022D Unstable burst fracture of second lumbar vertebra, subsequent encounter for fracture with routine healing: Secondary | ICD-10-CM | POA: Diagnosis not present

## 2020-02-23 DIAGNOSIS — E1165 Type 2 diabetes mellitus with hyperglycemia: Secondary | ICD-10-CM | POA: Diagnosis not present

## 2020-02-23 DIAGNOSIS — S32012D Unstable burst fracture of first lumbar vertebra, subsequent encounter for fracture with routine healing: Secondary | ICD-10-CM | POA: Diagnosis not present

## 2020-02-23 DIAGNOSIS — S22089D Unspecified fracture of T11-T12 vertebra, subsequent encounter for fracture with routine healing: Secondary | ICD-10-CM | POA: Diagnosis not present

## 2020-02-23 DIAGNOSIS — S32032D Unstable burst fracture of third lumbar vertebra, subsequent encounter for fracture with routine healing: Secondary | ICD-10-CM | POA: Diagnosis not present

## 2020-02-24 DIAGNOSIS — K5900 Constipation, unspecified: Secondary | ICD-10-CM | POA: Diagnosis not present

## 2020-02-24 DIAGNOSIS — W11XXXD Fall on and from ladder, subsequent encounter: Secondary | ICD-10-CM | POA: Diagnosis not present

## 2020-02-24 DIAGNOSIS — G4700 Insomnia, unspecified: Secondary | ICD-10-CM | POA: Diagnosis not present

## 2020-02-24 DIAGNOSIS — N139 Obstructive and reflux uropathy, unspecified: Secondary | ICD-10-CM | POA: Diagnosis not present

## 2020-02-24 DIAGNOSIS — M4810 Ankylosing hyperostosis [Forestier], site unspecified: Secondary | ICD-10-CM | POA: Diagnosis not present

## 2020-02-24 DIAGNOSIS — R32 Unspecified urinary incontinence: Secondary | ICD-10-CM | POA: Diagnosis not present

## 2020-02-24 DIAGNOSIS — S32022D Unstable burst fracture of second lumbar vertebra, subsequent encounter for fracture with routine healing: Secondary | ICD-10-CM | POA: Diagnosis not present

## 2020-02-24 DIAGNOSIS — E7800 Pure hypercholesterolemia, unspecified: Secondary | ICD-10-CM | POA: Diagnosis not present

## 2020-02-24 DIAGNOSIS — S22089D Unspecified fracture of T11-T12 vertebra, subsequent encounter for fracture with routine healing: Secondary | ICD-10-CM | POA: Diagnosis not present

## 2020-02-24 DIAGNOSIS — S32012D Unstable burst fracture of first lumbar vertebra, subsequent encounter for fracture with routine healing: Secondary | ICD-10-CM | POA: Diagnosis not present

## 2020-02-24 DIAGNOSIS — Z8546 Personal history of malignant neoplasm of prostate: Secondary | ICD-10-CM | POA: Diagnosis not present

## 2020-02-24 DIAGNOSIS — E1165 Type 2 diabetes mellitus with hyperglycemia: Secondary | ICD-10-CM | POA: Diagnosis not present

## 2020-02-24 DIAGNOSIS — Z981 Arthrodesis status: Secondary | ICD-10-CM | POA: Diagnosis not present

## 2020-02-24 DIAGNOSIS — F419 Anxiety disorder, unspecified: Secondary | ICD-10-CM | POA: Diagnosis not present

## 2020-02-24 DIAGNOSIS — Z79891 Long term (current) use of opiate analgesic: Secondary | ICD-10-CM | POA: Diagnosis not present

## 2020-02-24 DIAGNOSIS — M48061 Spinal stenosis, lumbar region without neurogenic claudication: Secondary | ICD-10-CM | POA: Diagnosis not present

## 2020-02-24 DIAGNOSIS — Z9181 History of falling: Secondary | ICD-10-CM | POA: Diagnosis not present

## 2020-02-24 DIAGNOSIS — N401 Enlarged prostate with lower urinary tract symptoms: Secondary | ICD-10-CM | POA: Diagnosis not present

## 2020-02-24 DIAGNOSIS — S32032D Unstable burst fracture of third lumbar vertebra, subsequent encounter for fracture with routine healing: Secondary | ICD-10-CM | POA: Diagnosis not present

## 2020-02-24 DIAGNOSIS — G40802 Other epilepsy, not intractable, without status epilepticus: Secondary | ICD-10-CM | POA: Diagnosis not present

## 2020-02-24 DIAGNOSIS — F329 Major depressive disorder, single episode, unspecified: Secondary | ICD-10-CM | POA: Diagnosis not present

## 2020-02-24 DIAGNOSIS — G6289 Other specified polyneuropathies: Secondary | ICD-10-CM | POA: Diagnosis not present

## 2020-02-24 DIAGNOSIS — M47816 Spondylosis without myelopathy or radiculopathy, lumbar region: Secondary | ICD-10-CM | POA: Diagnosis not present

## 2020-02-29 DIAGNOSIS — S32032D Unstable burst fracture of third lumbar vertebra, subsequent encounter for fracture with routine healing: Secondary | ICD-10-CM | POA: Diagnosis not present

## 2020-02-29 DIAGNOSIS — S22089D Unspecified fracture of T11-T12 vertebra, subsequent encounter for fracture with routine healing: Secondary | ICD-10-CM | POA: Diagnosis not present

## 2020-02-29 DIAGNOSIS — G40802 Other epilepsy, not intractable, without status epilepticus: Secondary | ICD-10-CM | POA: Diagnosis not present

## 2020-02-29 DIAGNOSIS — S32012D Unstable burst fracture of first lumbar vertebra, subsequent encounter for fracture with routine healing: Secondary | ICD-10-CM | POA: Diagnosis not present

## 2020-02-29 DIAGNOSIS — S32022D Unstable burst fracture of second lumbar vertebra, subsequent encounter for fracture with routine healing: Secondary | ICD-10-CM | POA: Diagnosis not present

## 2020-02-29 DIAGNOSIS — E1165 Type 2 diabetes mellitus with hyperglycemia: Secondary | ICD-10-CM | POA: Diagnosis not present

## 2020-03-02 DIAGNOSIS — G40802 Other epilepsy, not intractable, without status epilepticus: Secondary | ICD-10-CM | POA: Diagnosis not present

## 2020-03-02 DIAGNOSIS — S32032D Unstable burst fracture of third lumbar vertebra, subsequent encounter for fracture with routine healing: Secondary | ICD-10-CM | POA: Diagnosis not present

## 2020-03-02 DIAGNOSIS — S32012D Unstable burst fracture of first lumbar vertebra, subsequent encounter for fracture with routine healing: Secondary | ICD-10-CM | POA: Diagnosis not present

## 2020-03-02 DIAGNOSIS — S22089D Unspecified fracture of T11-T12 vertebra, subsequent encounter for fracture with routine healing: Secondary | ICD-10-CM | POA: Diagnosis not present

## 2020-03-02 DIAGNOSIS — E1165 Type 2 diabetes mellitus with hyperglycemia: Secondary | ICD-10-CM | POA: Diagnosis not present

## 2020-03-02 DIAGNOSIS — S32022D Unstable burst fracture of second lumbar vertebra, subsequent encounter for fracture with routine healing: Secondary | ICD-10-CM | POA: Diagnosis not present

## 2020-03-04 DIAGNOSIS — M810 Age-related osteoporosis without current pathological fracture: Secondary | ICD-10-CM | POA: Diagnosis not present

## 2020-03-04 DIAGNOSIS — S22089A Unspecified fracture of T11-T12 vertebra, initial encounter for closed fracture: Secondary | ICD-10-CM | POA: Diagnosis not present

## 2020-03-04 DIAGNOSIS — R7303 Prediabetes: Secondary | ICD-10-CM | POA: Diagnosis not present

## 2020-03-04 DIAGNOSIS — G40909 Epilepsy, unspecified, not intractable, without status epilepticus: Secondary | ICD-10-CM | POA: Diagnosis not present

## 2020-03-07 DIAGNOSIS — S22089D Unspecified fracture of T11-T12 vertebra, subsequent encounter for fracture with routine healing: Secondary | ICD-10-CM | POA: Diagnosis not present

## 2020-03-07 DIAGNOSIS — E1165 Type 2 diabetes mellitus with hyperglycemia: Secondary | ICD-10-CM | POA: Diagnosis not present

## 2020-03-07 DIAGNOSIS — S32032D Unstable burst fracture of third lumbar vertebra, subsequent encounter for fracture with routine healing: Secondary | ICD-10-CM | POA: Diagnosis not present

## 2020-03-07 DIAGNOSIS — S32022D Unstable burst fracture of second lumbar vertebra, subsequent encounter for fracture with routine healing: Secondary | ICD-10-CM | POA: Diagnosis not present

## 2020-03-07 DIAGNOSIS — G40802 Other epilepsy, not intractable, without status epilepticus: Secondary | ICD-10-CM | POA: Diagnosis not present

## 2020-03-07 DIAGNOSIS — S32012D Unstable burst fracture of first lumbar vertebra, subsequent encounter for fracture with routine healing: Secondary | ICD-10-CM | POA: Diagnosis not present

## 2020-03-09 DIAGNOSIS — G40802 Other epilepsy, not intractable, without status epilepticus: Secondary | ICD-10-CM | POA: Diagnosis not present

## 2020-03-09 DIAGNOSIS — S22089D Unspecified fracture of T11-T12 vertebra, subsequent encounter for fracture with routine healing: Secondary | ICD-10-CM | POA: Diagnosis not present

## 2020-03-09 DIAGNOSIS — S32012D Unstable burst fracture of first lumbar vertebra, subsequent encounter for fracture with routine healing: Secondary | ICD-10-CM | POA: Diagnosis not present

## 2020-03-09 DIAGNOSIS — S32032D Unstable burst fracture of third lumbar vertebra, subsequent encounter for fracture with routine healing: Secondary | ICD-10-CM | POA: Diagnosis not present

## 2020-03-09 DIAGNOSIS — S32022D Unstable burst fracture of second lumbar vertebra, subsequent encounter for fracture with routine healing: Secondary | ICD-10-CM | POA: Diagnosis not present

## 2020-03-09 DIAGNOSIS — E1165 Type 2 diabetes mellitus with hyperglycemia: Secondary | ICD-10-CM | POA: Diagnosis not present

## 2020-03-14 DIAGNOSIS — S32012D Unstable burst fracture of first lumbar vertebra, subsequent encounter for fracture with routine healing: Secondary | ICD-10-CM | POA: Diagnosis not present

## 2020-03-14 DIAGNOSIS — E1165 Type 2 diabetes mellitus with hyperglycemia: Secondary | ICD-10-CM | POA: Diagnosis not present

## 2020-03-14 DIAGNOSIS — S32022D Unstable burst fracture of second lumbar vertebra, subsequent encounter for fracture with routine healing: Secondary | ICD-10-CM | POA: Diagnosis not present

## 2020-03-14 DIAGNOSIS — S32032D Unstable burst fracture of third lumbar vertebra, subsequent encounter for fracture with routine healing: Secondary | ICD-10-CM | POA: Diagnosis not present

## 2020-03-14 DIAGNOSIS — S22089D Unspecified fracture of T11-T12 vertebra, subsequent encounter for fracture with routine healing: Secondary | ICD-10-CM | POA: Diagnosis not present

## 2020-03-14 DIAGNOSIS — G40802 Other epilepsy, not intractable, without status epilepticus: Secondary | ICD-10-CM | POA: Diagnosis not present

## 2020-03-17 ENCOUNTER — Other Ambulatory Visit: Payer: Self-pay | Admitting: Neurology

## 2020-03-17 ENCOUNTER — Telehealth: Payer: Self-pay | Admitting: Neurology

## 2020-03-17 DIAGNOSIS — Z23 Encounter for immunization: Secondary | ICD-10-CM | POA: Diagnosis not present

## 2020-03-17 MED ORDER — LEVETIRACETAM 1000 MG PO TABS: 1000.0000 mg | ORAL_TABLET | Freq: Two times a day (BID) | ORAL | 1 refills | Status: DC

## 2020-03-17 NOTE — Telephone Encounter (Signed)
Pt is needing a refill on his levETIRAcetam (KEPPRA) 1000 MG tablet sent in to the [facility] in [place]

## 2020-03-17 NOTE — Telephone Encounter (Signed)
Refill sent for the patient to his pharmacy. Patient may get this going forward with his PCP.

## 2020-03-18 DIAGNOSIS — S32032D Unstable burst fracture of third lumbar vertebra, subsequent encounter for fracture with routine healing: Secondary | ICD-10-CM | POA: Diagnosis not present

## 2020-03-18 DIAGNOSIS — S32012D Unstable burst fracture of first lumbar vertebra, subsequent encounter for fracture with routine healing: Secondary | ICD-10-CM | POA: Diagnosis not present

## 2020-03-18 DIAGNOSIS — S22089D Unspecified fracture of T11-T12 vertebra, subsequent encounter for fracture with routine healing: Secondary | ICD-10-CM | POA: Diagnosis not present

## 2020-03-18 DIAGNOSIS — E1165 Type 2 diabetes mellitus with hyperglycemia: Secondary | ICD-10-CM | POA: Diagnosis not present

## 2020-03-18 DIAGNOSIS — S32022D Unstable burst fracture of second lumbar vertebra, subsequent encounter for fracture with routine healing: Secondary | ICD-10-CM | POA: Diagnosis not present

## 2020-03-18 DIAGNOSIS — G40802 Other epilepsy, not intractable, without status epilepticus: Secondary | ICD-10-CM | POA: Diagnosis not present

## 2020-03-23 DIAGNOSIS — S32032D Unstable burst fracture of third lumbar vertebra, subsequent encounter for fracture with routine healing: Secondary | ICD-10-CM | POA: Diagnosis not present

## 2020-03-23 DIAGNOSIS — S22089D Unspecified fracture of T11-T12 vertebra, subsequent encounter for fracture with routine healing: Secondary | ICD-10-CM | POA: Diagnosis not present

## 2020-03-23 DIAGNOSIS — S32012D Unstable burst fracture of first lumbar vertebra, subsequent encounter for fracture with routine healing: Secondary | ICD-10-CM | POA: Diagnosis not present

## 2020-03-23 DIAGNOSIS — E1165 Type 2 diabetes mellitus with hyperglycemia: Secondary | ICD-10-CM | POA: Diagnosis not present

## 2020-03-23 DIAGNOSIS — G40802 Other epilepsy, not intractable, without status epilepticus: Secondary | ICD-10-CM | POA: Diagnosis not present

## 2020-03-23 DIAGNOSIS — S32022D Unstable burst fracture of second lumbar vertebra, subsequent encounter for fracture with routine healing: Secondary | ICD-10-CM | POA: Diagnosis not present

## 2020-04-28 DIAGNOSIS — S22009D Unspecified fracture of unspecified thoracic vertebra, subsequent encounter for fracture with routine healing: Secondary | ICD-10-CM | POA: Diagnosis not present

## 2020-06-13 ENCOUNTER — Encounter: Payer: Self-pay | Admitting: Adult Health

## 2020-06-13 ENCOUNTER — Other Ambulatory Visit: Payer: Self-pay

## 2020-06-13 ENCOUNTER — Ambulatory Visit (INDEPENDENT_AMBULATORY_CARE_PROVIDER_SITE_OTHER): Payer: Medicare Other | Admitting: Adult Health

## 2020-06-13 VITALS — BP 135/96 | HR 57 | Ht 72.0 in | Wt 209.4 lb

## 2020-06-13 DIAGNOSIS — R569 Unspecified convulsions: Secondary | ICD-10-CM | POA: Diagnosis not present

## 2020-06-13 DIAGNOSIS — G6289 Other specified polyneuropathies: Secondary | ICD-10-CM

## 2020-06-13 MED ORDER — LEVETIRACETAM 1000 MG PO TABS: 1000.0000 mg | ORAL_TABLET | Freq: Two times a day (BID) | ORAL | 3 refills | Status: DC

## 2020-06-13 NOTE — Progress Notes (Signed)
PATIENT: Benjamin [name] DOB: 09/04/1951  REASON FOR VISIT: follow up HISTORY FROM: patient  HISTORY OF PRESENT ILLNESS: Today 06/13/20:  Mr. Bistline is a 69 year old male with a history of neuropathy and seizures.  He returns today for follow-up.  He remains on Keppra 1000 mg twice a day.  He reports that he had a seizure back in February.  States that he was changing a light bulb and fell.  He was taken to the emergency room.  Keppra was increased to 1000 mg.  He did injure his back and had to have surgery.  He reports that he has not been operating a motor vehicle.  He states that he believes he missed his morning dose of Keppra.  Denies any changes with his gait or balance.  Denies any significant discomfort in the legs.  He returns today for an evaluation.  HISTORY 11-19-2019; Happily retired, Mr. Muzyka is seen here for a regular RV. He has a neuropathy and a seizure disorder. Last seizure in January 2020, On Keppra 750 mg twice a day. He reports that he is tolerating this medication well. No additional seizures. 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 Adheres to meds, not drinking.  He feels that his memory has remained stable.  REVIEW OF SYSTEMS: Out of a complete 14 system review of symptoms, the patient complains only of the following symptoms, and all other reviewed systems are negative.  See HPI  ALLERGIES: Allergies  Allergen Reactions   Aspartame And Phenylalanine Other (See Comments)    Powder causes seizures   Benadryl [Diphenhydramine] Other (See Comments)    seizures   Zoloft [Sertraline Hcl] Other (See Comments)    Confusion Memory loss    HOME MEDICATIONS: Outpatient Medications Prior to Visit  Medication Sig Dispense Refill   acetaminophen (TYLENOL) 325 MG tablet Take 2 tablets (650 mg total) by mouth 3 (three) times daily.     celecoxib (CELEBREX) 200 MG capsule Take 200 mg by  mouth 2 (two) times daily.     levETIRAcetam (KEPPRA) 1000 MG tablet Take 1 tablet (1,000 mg total) by mouth 2 (two) times daily. 180 tablet 1   ALPRAZolam (XANAX) 1 MG tablet 0.5 mg at night if not able to sleep- this is for insomnia. (Patient not taking: Reported on 02/03/2020) 30 tablet 0   Melatonin 3 MG TABS Take 1 tablet (3 mg total) by mouth at bedtime as needed (sleep). (Patient not taking: Reported on 02/03/2020)  0   methocarbamol (ROBAXIN) 500 MG tablet Take 1 tablet (500 mg total) by mouth every 6 (six) hours as needed for muscle spasms. 60 tablet 0   oxyCODONE (OXYCONTIN) 10 mg 12 hr tablet Take 1 tablet (10 mg total) by mouth every 12 (twelve) hours. (Patient not taking: Reported on 02/03/2020) 14 tablet 0   Oxycodone HCl 10 MG TABS Take 0.5-1 tablets (5-10 mg total) by mouth every 6 (six) hours as needed. (Patient not taking: Reported on 02/03/2020) 14 tablet 0   polyethylene glycol (MIRALAX / GLYCOLAX) 17 g packet Take 17 g by mouth 2 (two) times daily. (Patient not taking: Reported on 02/03/2020) 60 each 0   senna (SENOKOT) 8.6 MG TABS tablet Take 2 tablets (17.2 mg total) by mouth daily. (Patient not taking: Reported on 02/03/2020) 60 tablet 0   No facility-administered medications prior to visit.    PAST MEDICAL HISTORY: Past Medical History:  Diagnosis  Date   Acute prostatitis 07/03/12   s/p prostate    Allergy    Anxiety    Arthritis    stenosis lower back   Back pain    BPH (benign prostatic hypertrophy) with urinary obstruction    Depression    ED (erectile dysfunction)    Headache(784.0)    "sinus" headaches   History of ITP    as a child per patient   Hypercholesterolemia    Other forms of epilepsy and recurrent seizures without mention of intractable epilepsy 09/16/2013   Peripheral axonal neuropathy 08/06/2014   Prostate cancer ([place]) 07/03/12   Adenocarcinoma,gleason=3+3=6,PSA=5.0,volume=20cc   Seizures ([place])    Unspecified hereditary and  idiopathic peripheral neuropathy 07/07/2014   Foot numbness starting 2010, hands beginning in 2014- 15 .    PAST SURGICAL HISTORY: Past Surgical History:  Procedure Laterality Date   BACK SURGERY     COLONOSCOPY     KNEE ARTHROSCOPY  2017   LUMBAR LAMINECTOMY/DECOMPRESSION MICRODISCECTOMY N/A 12/23/2013   Procedure: Lumbar Laminectomy Decompression, Lumbar Two, Three, Four, Five;  Surgeon: Hosie Spangle, MD;  Location: [facility];  Service: Neurosurgery;  Laterality: N/A;   LYMPHADENECTOMY Bilateral 05/25/2016   Procedure: BILATERAL PELVIC LYMPHADENECTOMY;  Surgeon: Alexis Frock, MD;  [facility];  Service: Urology;  Laterality: Bilateral;   POSTERIOR LUMBAR FUSION 4 LEVEL N/A 01/08/2020   Procedure: THORACIC NINE TO LUMBAR TWO POSTERIOR SPINAL INSTRUMENTED FUSION;  Surgeon: Judith Part, MD;  Location: [facility];  Service: Neurosurgery;  Laterality: N/A;   ROBOT ASSISTED LAPAROSCOPIC RADICAL PROSTATECTOMY N/A 05/25/2016   Procedure: XI ROBOTIC ASSISTED LAPAROSCOPIC RADICAL PROSTATECTOMY WITH INDOCYANINE GREEN DYE;  Surgeon: Alexis Frock, MD;  [facility];  Service: Urology;  Laterality: N/A;   TOE SURGERY      FAMILY HISTORY: Family History  Problem Relation Age of Onset   Cancer Father 65       prostate/prostatectomy   Cancer Cousin 41       prostate ca, also heart problems    SOCIAL HISTORY: Social History   Socioeconomic History   Marital status: Married    Spouse name: [name]   Number of children: 1   Years of education: Asso x 2   Highest education level: Not on file  Occupational History    Employer: [employer]  Tobacco Use   Smoking status: Never Smoker   Smokeless tobacco: Never Used  Substance and Sexual Activity   Alcohol use: Yes    Comment: social beer occasionally   Drug use: No    Comment: chewed in high school short time   Sexual activity: Yes  Other Topics Concern   Not on file  Social History Narrative    Patient is married [name]) and lives at home with his wife and 1 child and a step-child.   Patient is working full-time.   Patient has two Associate degrees.   Patient is right-handed.   Patient drinks four cups of tea daily.   Social Determinants of Health   Financial Resource Strain:    Difficulty of Paying Living Expenses:   Food Insecurity:    Worried About Charity fundraiser in the Last Year:    Arboriculturist in the Last Year:   Transportation Needs:    Film/video editor (Medical):    Lack of Transportation (Non-Medical):   Physical Activity:    Days of Exercise per Week:    Minutes of Exercise per Session:  Stress:    Feeling of Stress :   Social Connections:    Frequency of Communication with Friends and Family:    Frequency of Social Gatherings with Friends and Family:    Attends Religious Services:    Active Member of Clubs or Organizations:    Attends Archivist Meetings:    Marital Status:   Intimate Partner Violence:    Fear of Current or Ex-Partner:    Emotionally Abused:    Physically Abused:    Sexually Abused:       PHYSICAL EXAM  Vitals:   06/13/20 0807  BP: (!) 135/96  Pulse: 57  Weight: (!) 209 lb 6.4 oz (95 kg)  Height: 6' (1.829 m)   Body mass index is 28.4 kg/m.  Generalized: Well developed, in no acute distress   Neurological examination  Mentation: Alert oriented to time, place, history taking. Follows all commands speech and language fluent Cranial nerve II-XII: Pupils were equal round reactive to light. Extraocular movements were full, visual field were full on confrontational test. Facial sensation and strength were normal. Uvula tongue midline. Head turning and shoulder shrug  were normal and symmetric. Motor: The motor testing reveals 5 over 5 strength of all 4 extremities. Good symmetric motor tone is noted throughout.  Sensory: Sensory testing is intact to soft touch on all 4 extremities. No  evidence of extinction is noted.  Coordination: Cerebellar testing reveals good finger-nose-finger and heel-to-shin bilaterally.  Gait and station: Gait is normal.  Reflexes: Deep tendon reflexes are symmetric and normal bilaterally.   DIAGNOSTIC DATA (LABS, IMAGING, TESTING) - I reviewed patient records, labs, notes, testing and imaging myself where available.  Lab Results  Component Value Date   WBC 7.4 01/18/2020   HGB 11.9 (L) 01/18/2020   HCT 36.0 (L) 01/18/2020   MCV 92.1 01/18/2020   PLT 310 01/18/2020      Component Value Date/Time   NA 136 01/18/2020 0550   NA 140 05/13/2019 1428   K 4.3 01/18/2020 0550   CL 101 01/18/2020 0550   CO2 27 01/18/2020 0550   GLUCOSE 123 (H) 01/18/2020 0550   BUN 10 01/18/2020 0550   BUN 12 05/13/2019 1428   CREATININE 0.97 01/18/2020 0550   CALCIUM 8.4 (L) 01/18/2020 0550   PROT 5.4 (L) 01/12/2020 0909   PROT 6.2 05/13/2019 1428   ALBUMIN 2.8 (L) 01/12/2020 0909   ALBUMIN 4.4 05/13/2019 1428   AST 18 01/12/2020 0909   ALT 18 01/12/2020 0909   ALKPHOS 45 01/12/2020 0909   BILITOT 1.2 01/12/2020 0909   BILITOT 0.3 05/13/2019 1428   GFRNONAA >60 01/18/2020 0550   GFRAA >60 01/18/2020 0550   No results found for: CHOL, HDL, LDLCALC, LDLDIRECT, TRIG, CHOLHDL Lab Results  Component Value Date   HGBA1C 6.7 (H) 01/12/2020   Lab Results  Component Value Date   VITAMINB12 354 07/07/2014   Lab Results  Component Value Date   TSH 1.510 07/07/2014      ASSESSMENT AND PLAN 69 y.o. year old male  has a past medical history of Acute prostatitis (07/03/12), Allergy, Anxiety, Arthritis, Back pain, BPH (benign prostatic hypertrophy) with urinary obstruction, Depression, ED (erectile dysfunction), Headache(784.0), History of ITP, Hypercholesterolemia, Other forms of epilepsy and recurrent seizures without mention of intractable epilepsy (09/16/2013), Peripheral axonal neuropathy (08/06/2014), Prostate cancer ([place]) (07/03/12), Seizures ([place]), and  Unspecified hereditary and idiopathic peripheral neuropathy (07/07/2014). here with:  Seizures   Continue Keppra 1000 mg twice a day  Advised  that he should not operate a motor vehicle until he has been seizure-free for 6 months.  This should be at the end of this month.  Advised if he has any seizure events he should let us know  Neuropathy   Stable  Continue to monitor symptoms  Follow-up in 1 year or sooner if needed   I spent 20  minutes of face-to-face and non-face-to-face time with patient.  This included previsit chart review, lab review, study review, order entry, electronic health record documentation, patient education.  Ward Givens, MSN, NP-C 06/13/2020, 8:09 AM [facility] [address], [place], [place] [address] [phone]

## 2020-06-13 NOTE — Patient Instructions (Signed)
Continue Keppra If you have any seizure events please let us know.   

## 2020-06-17 DIAGNOSIS — R7303 Prediabetes: Secondary | ICD-10-CM | POA: Diagnosis not present

## 2020-06-17 DIAGNOSIS — M810 Age-related osteoporosis without current pathological fracture: Secondary | ICD-10-CM | POA: Diagnosis not present

## 2020-06-17 DIAGNOSIS — M545 Low back pain: Secondary | ICD-10-CM | POA: Diagnosis not present

## 2020-06-17 DIAGNOSIS — Z6829 Body mass index (BMI) 29.0-29.9, adult: Secondary | ICD-10-CM | POA: Diagnosis not present

## 2020-06-17 DIAGNOSIS — R32 Unspecified urinary incontinence: Secondary | ICD-10-CM | POA: Diagnosis not present

## 2020-06-17 DIAGNOSIS — G40909 Epilepsy, unspecified, not intractable, without status epilepticus: Secondary | ICD-10-CM | POA: Diagnosis not present

## 2020-06-17 DIAGNOSIS — E781 Pure hyperglyceridemia: Secondary | ICD-10-CM | POA: Diagnosis not present

## 2020-08-29 DIAGNOSIS — Z20822 Contact with and (suspected) exposure to covid-19: Secondary | ICD-10-CM | POA: Diagnosis not present

## 2020-08-29 DIAGNOSIS — J019 Acute sinusitis, unspecified: Secondary | ICD-10-CM | POA: Diagnosis not present

## 2020-09-16 DIAGNOSIS — M546 Pain in thoracic spine: Secondary | ICD-10-CM | POA: Diagnosis not present

## 2020-09-16 DIAGNOSIS — G40909 Epilepsy, unspecified, not intractable, without status epilepticus: Secondary | ICD-10-CM | POA: Diagnosis not present

## 2020-09-16 DIAGNOSIS — Z6831 Body mass index (BMI) 31.0-31.9, adult: Secondary | ICD-10-CM | POA: Diagnosis not present

## 2020-09-16 DIAGNOSIS — M810 Age-related osteoporosis without current pathological fracture: Secondary | ICD-10-CM | POA: Diagnosis not present

## 2020-10-13 DIAGNOSIS — Z23 Encounter for immunization: Secondary | ICD-10-CM | POA: Diagnosis not present

## 2020-11-21 ENCOUNTER — Ambulatory Visit: Payer: 59 | Admitting: Adult Health

## 2020-12-14 ENCOUNTER — Ambulatory Visit (INDEPENDENT_AMBULATORY_CARE_PROVIDER_SITE_OTHER): Payer: Medicare Other | Admitting: Adult Health

## 2020-12-14 ENCOUNTER — Encounter: Payer: Self-pay | Admitting: Adult Health

## 2020-12-14 VITALS — BP 128/79 | HR 86 | Ht 72.0 in | Wt 227.0 lb

## 2020-12-14 DIAGNOSIS — G6289 Other specified polyneuropathies: Secondary | ICD-10-CM

## 2020-12-14 DIAGNOSIS — R569 Unspecified convulsions: Secondary | ICD-10-CM | POA: Diagnosis not present

## 2020-12-14 MED ORDER — LEVETIRACETAM 1000 MG PO TABS: 1000.0000 mg | ORAL_TABLET | Freq: Two times a day (BID) | ORAL | 4 refills | Status: DC

## 2020-12-14 NOTE — Progress Notes (Signed)
PATIENT: Benjamin [name] DOB: Dec 03, 1950  REASON FOR VISIT: follow up HISTORY FROM: patient  HISTORY OF PRESENT ILLNESS: Today 12/14/20:  Benjamin [name] is a 70 year old male with a history of neuropathy and seizures.  He returns today for follow-up.  He states that he is not had any additional seizure events.  Remains on Keppra 1000 mg twice a day.  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  Reports that on occasion he has some discomfort in the feet.  He also has spinal stenosis so that interferes with his ability to ambulate.  He continues to exercise daily.  He returns today for an evaluation.  06/13/20 Benjamin [name] is a 70 year old male with a history of neuropathy and seizures.  He returns today for follow-up.  He remains on Keppra 1000 mg twice a day.  He reports that he had a seizure back in February.  States that he was changing a light bulb and fell.  He was taken to the emergency room.  Keppra was increased to 1000 mg.  He did injure his back and had to have surgery.  He reports that he has not been operating a motor vehicle.  He states that he believes he missed his morning dose of Keppra.  Denies any changes with his gait or balance.  Denies any significant discomfort in the legs.  He returns today for an evaluation.  HISTORY 11-19-2019; Happily retired, Benjamin [name] is seen here for a regular RV. He has a neuropathy and a seizure disorder. Last seizure in January 2020, On Keppra 750 mg twice a day. He reports that he is tolerating this medication well. No additional seizures. 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 Adheres to meds, not drinking.  He feels that his memory has remained stable.  REVIEW OF SYSTEMS: Out of a complete 14 system review of symptoms, the patient complains only of the following symptoms, and all other reviewed systems are negative.  See HPI  ALLERGIES: Allergies   Allergen Reactions  . Aspartame And Phenylalanine Other (See Comments)    Powder causes seizures  . Benadryl [Diphenhydramine] Other (See Comments)    seizures  . Zoloft [Sertraline Hcl] Other (See Comments)    Confusion Memory loss    HOME MEDICATIONS: Outpatient Medications Prior to Visit  Medication Sig Dispense Refill  . acetaminophen (TYLENOL) 325 MG tablet Take 2 tablets (650 mg total) by mouth 3 (three) times daily.    . celecoxib (CELEBREX) 200 MG capsule Take 200 mg by mouth 2 (two) times daily.    Marland Kitchen levETIRAcetam (KEPPRA) 1000 MG tablet Take 1 tablet (1,000 mg total) by mouth 2 (two) times daily. 180 tablet 3   No facility-administered medications prior to visit.    PAST MEDICAL HISTORY: Past Medical History:  Diagnosis Date  . Acute prostatitis 07/03/12   s/p prostate   . Allergy   . Anxiety   . Arthritis    stenosis lower back  . Back pain   . BPH (benign prostatic hypertrophy) with urinary obstruction   . Depression   . ED (erectile dysfunction)   . Headache(784.0)    "sinus" headaches  . History of ITP    as a child per patient  . Hypercholesterolemia   . Other forms of epilepsy and recurrent seizures without mention of intractable epilepsy 09/16/2013  . Peripheral axonal neuropathy 08/06/2014  . Prostate cancer (  [place]) 07/03/12   Adenocarcinoma,gleason=3+3=6,PSA=5.0,volume=20cc  . Seizures ([place])   . Unspecified hereditary and idiopathic peripheral neuropathy 07/07/2014   Foot numbness starting 2010, hands beginning in 2014- 15 .    PAST SURGICAL HISTORY: Past Surgical History:  Procedure Laterality Date  . BACK SURGERY    . COLONOSCOPY    . KNEE ARTHROSCOPY  2017  . LUMBAR LAMINECTOMY/DECOMPRESSION MICRODISCECTOMY N/A 12/23/2013   Procedure: Lumbar Laminectomy Decompression, Lumbar Two, Three, Four, Five;  Surgeon: Hosie Spangle, MD;  Location: [facility];  Service: Neurosurgery;  Laterality: N/A;  . LYMPHADENECTOMY Bilateral 05/25/2016    Procedure: BILATERAL PELVIC LYMPHADENECTOMY;  Surgeon: Alexis Frock, MD;  Location: [facility];  Service: Urology;  Laterality: Bilateral;  . POSTERIOR LUMBAR FUSION 4 LEVEL N/A 01/08/2020   Procedure: THORACIC NINE TO LUMBAR TWO POSTERIOR SPINAL INSTRUMENTED FUSION;  Surgeon: Judith Part, MD;  Location: [place];  Service: Neurosurgery;  Laterality: N/A;  . ROBOT ASSISTED LAPAROSCOPIC RADICAL PROSTATECTOMY N/A 05/25/2016   Procedure: XI ROBOTIC ASSISTED LAPAROSCOPIC RADICAL PROSTATECTOMY WITH INDOCYANINE GREEN DYE;  Surgeon: Alexis Frock, MD;  Location[facility];  Service: Urology;  Laterality: N/A;  . TOE SURGERY      FAMILY HISTORY: Family History  Problem Relation Age of Onset  . Cancer Father 21       prostate/prostatectomy  . Cancer Cousin 72       prostate ca, also heart problems    SOCIAL HISTORY: Social History   Socioeconomic History  . Marital status: Married    Spouse name: [name]  . Number of children: 1  . Years of education: Asso x 2  . Highest education level: Not on file  Occupational History    Employer: [employer]  Tobacco Use  . Smoking status: Never Smoker  . Smokeless tobacco: Never Used  Substance and Sexual Activity  . Alcohol use: Yes    Comment: social beer occasionally  . Drug use: No    Comment: chewed in high school short time  . Sexual activity: Yes  Other Topics Concern  . Not on file  Social History Narrative   Patient is married [name]) and lives at home with his wife and 1 child and a step-child.   Patient is working full-time.   Patient has two Associate degrees.   Patient is right-handed.   Patient drinks four cups of tea daily.   Social Determinants of Health   Financial Resource Strain: Not on file  Food Insecurity: Not on file  Transportation Needs: Not on file  Physical Activity: Not on file  Stress: Not on file  Social Connections: Not on file  Intimate Partner Violence: Not on file      PHYSICAL EXAM  Vitals:    12/14/20 1019  BP: 128/79  Pulse: 86  Weight: 227 lb (103 kg)  Height: 6' (1.829 m)   Body mass index is 30.79 kg/m.  Generalized: Well developed, in no acute distress   Neurological examination  Mentation: Alert oriented to time, place, history taking. Follows all commands speech and language fluent Cranial nerve II-XII: Pupils were equal round reactive to light. Extraocular movements were full, visual field were full on confrontational test.. Head turning and shoulder shrug  were normal and symmetric. Motor: The motor testing reveals 5 over 5 strength of all 4 extremities. Good symmetric motor tone is noted throughout.  Sensory: Sensory testing is intact to soft touch on all 4 extremities. No evidence of extinction is noted.  Coordination: Cerebellar testing  reveals good finger-nose-finger and heel-to-shin bilaterally.  Gait and station: Gait is normal.  Stooped posture when ambulating Reflexes: Deep tendon reflexes are symmetric and normal bilaterally.   DIAGNOSTIC DATA (LABS, IMAGING, TESTING) - I reviewed patient records, labs, notes, testing and imaging myself where available.  Lab Results  Component Value Date   WBC 7.4 01/18/2020   HGB 11.9 (L) 01/18/2020   HCT 36.0 (L) 01/18/2020   MCV 92.1 01/18/2020   PLT 310 01/18/2020      Component Value Date/Time   NA 136 01/18/2020 0550   NA 140 05/13/2019 1428   K 4.3 01/18/2020 0550   CL 101 01/18/2020 0550   CO2 27 01/18/2020 0550   GLUCOSE 123 (H) 01/18/2020 0550   BUN 10 01/18/2020 0550   BUN 12 05/13/2019 1428   CREATININE 0.97 01/18/2020 0550   CALCIUM 8.4 (L) 01/18/2020 0550   PROT 5.4 (L) 01/12/2020 0909   PROT 6.2 05/13/2019 1428   ALBUMIN 2.8 (L) 01/12/2020 0909   ALBUMIN 4.4 05/13/2019 1428   AST 18 01/12/2020 0909   ALT 18 01/12/2020 0909   ALKPHOS 45 01/12/2020 0909   BILITOT 1.2 01/12/2020 0909   BILITOT 0.3 05/13/2019 1428   GFRNONAA >60 01/18/2020 0550   GFRAA >60 01/18/2020 0550    Lab  Results  Component Value Date   HGBA1C 6.7 (H) 01/12/2020   Lab Results  Component Value Date   VITAMINB12 354 07/07/2014   Lab Results  Component Value Date   TSH 1.510 07/07/2014      ASSESSMENT AND PLAN 70 y.o. year old male  has a past medical history of Acute prostatitis (07/03/12), Allergy, Anxiety, Arthritis, Back pain, BPH (benign prostatic hypertrophy) with urinary obstruction, Depression, ED (erectile dysfunction), Headache(784.0), History of ITP, Hypercholesterolemia, Other forms of epilepsy and recurrent seizures without mention of intractable epilepsy (09/16/2013), Peripheral axonal neuropathy (08/06/2014), Prostate cancer ([place]) (07/03/12), Seizures ([place]), and Unspecified hereditary and idiopathic peripheral neuropathy (07/07/2014). here with:  Seizures   Continue Keppra 1000 mg twice a day  Advised if he has any seizure events he should let us know  Neuropathy   Stable  Continue to monitor symptoms  Follow-up in 1 year or sooner if needed   I spent 20  minutes of face-to-face and non-face-to-face time with patient.  This included previsit chart review, lab review, study review, order entry, electronic health record documentation, patient education.  Ward Givens, MSN, NP-C 12/14/2020, 10:37 AM [facility] [address] [place], [place] [address] [phone]

## 2020-12-14 NOTE — Patient Instructions (Signed)
Your Plan:  Continue Keppra 1000 mg twice a day If your symptoms worsen or you develop new symptoms please let us know.   Thank you for coming to see us at [facility]. I hope we have been able to provide you high quality care today.  You may receive a patient satisfaction survey over the next few weeks. We would appreciate your feedback and comments so that we may continue to improve ourselves and the health of our patients.   

## 2020-12-15 ENCOUNTER — Telehealth: Payer: Self-pay | Admitting: Adult Health

## 2020-12-15 NOTE — Telephone Encounter (Signed)
Pt. has called to add Alendronate-Sodium 70 mg. once a week to his medication list. If there are any questions, may leave a vm on phone number.

## 2020-12-15 NOTE — Telephone Encounter (Signed)
Done

## 2021-01-04 DIAGNOSIS — M810 Age-related osteoporosis without current pathological fracture: Secondary | ICD-10-CM | POA: Diagnosis not present

## 2021-01-04 DIAGNOSIS — Z8546 Personal history of malignant neoplasm of prostate: Secondary | ICD-10-CM | POA: Diagnosis not present

## 2021-01-04 DIAGNOSIS — R32 Unspecified urinary incontinence: Secondary | ICD-10-CM | POA: Diagnosis not present

## 2021-01-04 DIAGNOSIS — G40909 Epilepsy, unspecified, not intractable, without status epilepticus: Secondary | ICD-10-CM | POA: Diagnosis not present

## 2021-01-04 DIAGNOSIS — E781 Pure hyperglyceridemia: Secondary | ICD-10-CM | POA: Diagnosis not present

## 2021-01-04 DIAGNOSIS — M545 Low back pain, unspecified: Secondary | ICD-10-CM | POA: Diagnosis not present

## 2021-01-04 DIAGNOSIS — R7303 Prediabetes: Secondary | ICD-10-CM | POA: Diagnosis not present

## 2021-01-04 DIAGNOSIS — Z6831 Body mass index (BMI) 31.0-31.9, adult: Secondary | ICD-10-CM | POA: Diagnosis not present

## 2021-06-13 DIAGNOSIS — R32 Unspecified urinary incontinence: Secondary | ICD-10-CM | POA: Diagnosis not present

## 2021-06-13 DIAGNOSIS — Z6831 Body mass index (BMI) 31.0-31.9, adult: Secondary | ICD-10-CM | POA: Diagnosis not present

## 2021-06-13 DIAGNOSIS — M549 Dorsalgia, unspecified: Secondary | ICD-10-CM | POA: Diagnosis not present

## 2021-06-13 DIAGNOSIS — Z8546 Personal history of malignant neoplasm of prostate: Secondary | ICD-10-CM | POA: Diagnosis not present

## 2021-06-15 IMAGING — CT CT HEAD W/O CM
3 series · 17 of 37 positions shown, 19 images · non-contrast
Comparison: 3566 CT

CLINICAL DATA: Seizure

EXAM:
CT HEAD WITHOUT CONTRAST
TECHNIQUE: Contiguous axial images were obtained from the base of the skull
through the vertex without intravenous contrast.

[Series 3: head without · axial · non-contrast · 0.46mm/px · z∈[-220,-80]mm · 7 of 38 slices shown, 9 images]
[im 5/38  brain]
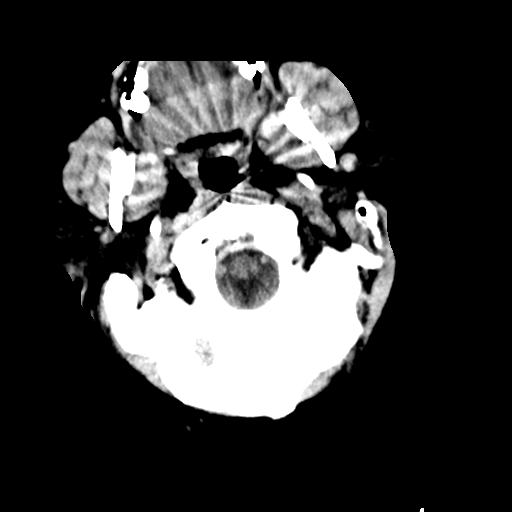
[im 5/38  bone]
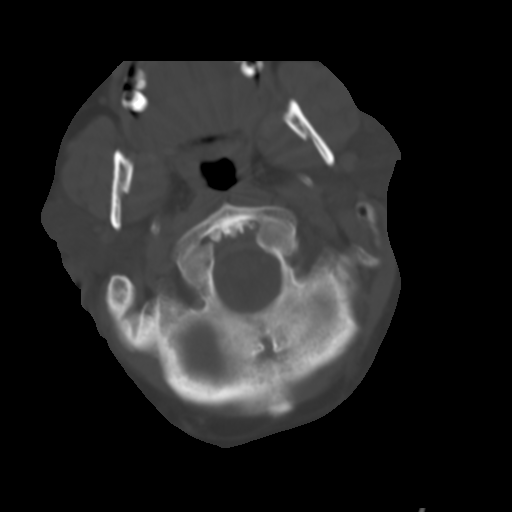
[im 10/38  brain]
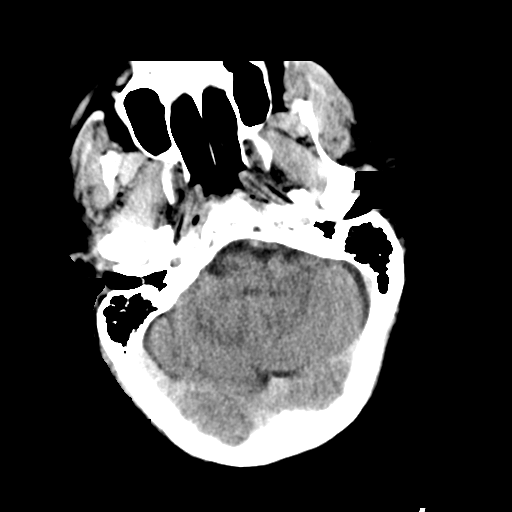
[im 14/38  brain]
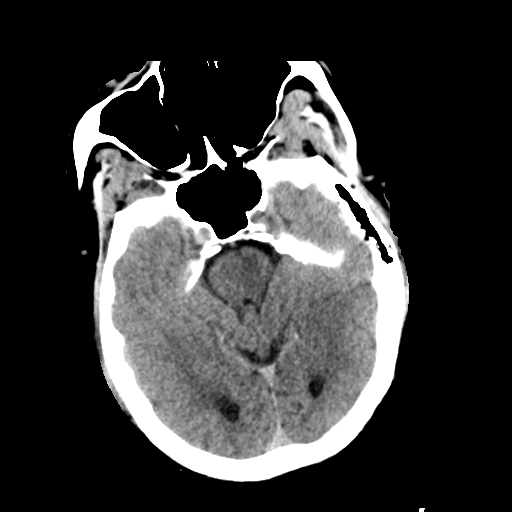
[im 19/38  brain]
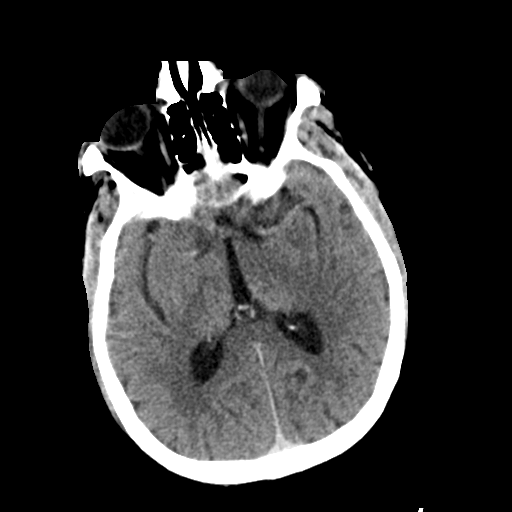
[im 24/38  brain]
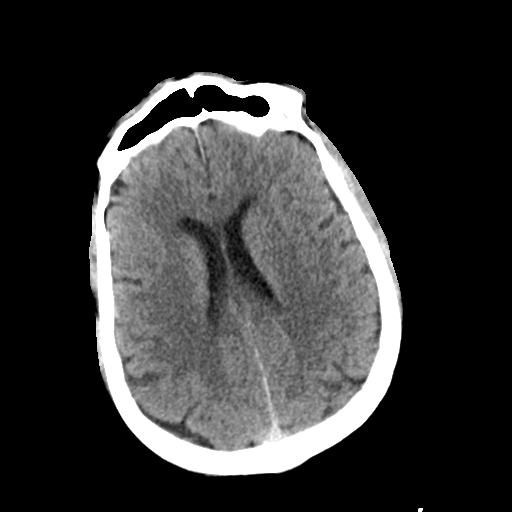
[im 24/38  bone]
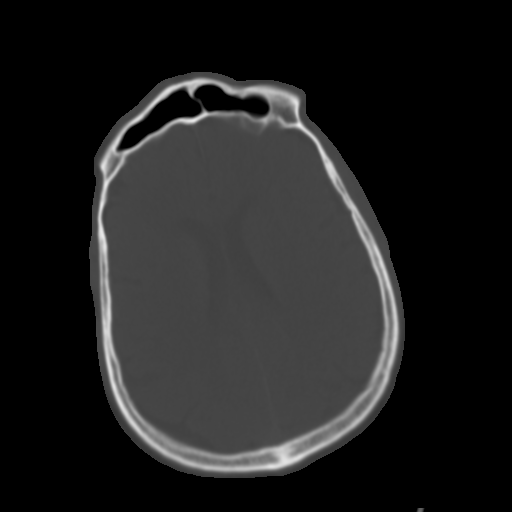
[im 28/38  brain]
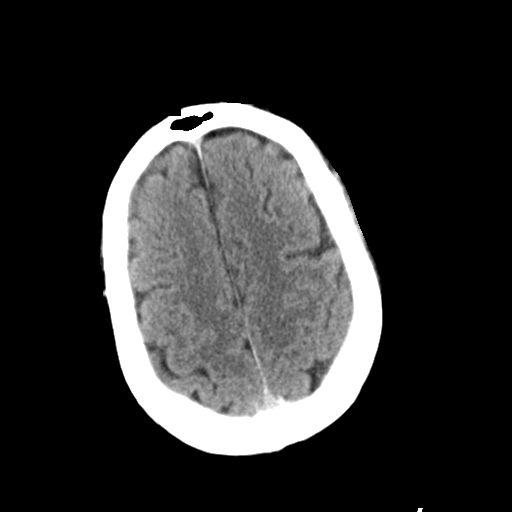
[im 33/38  brain]
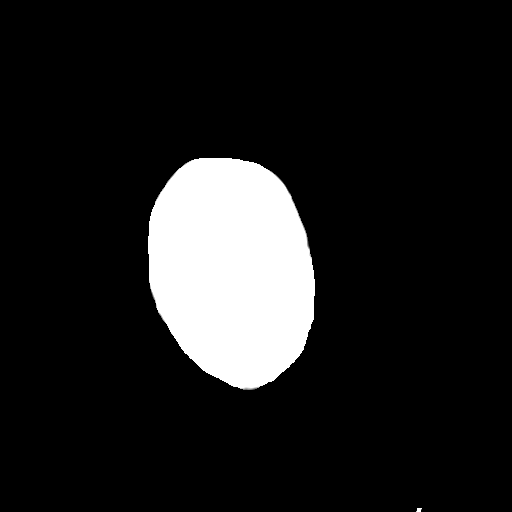

[Series 4: head bone · axial · 0.46mm/px · z∈[-222,-90]mm · 7 of 95 slices shown]
[im 10/95  bone]
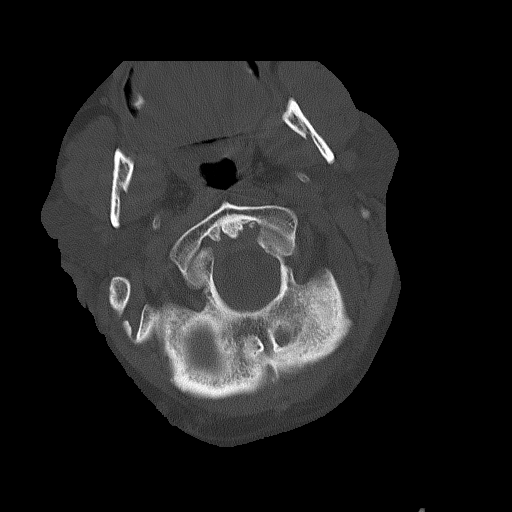
[im 19/95  bone]
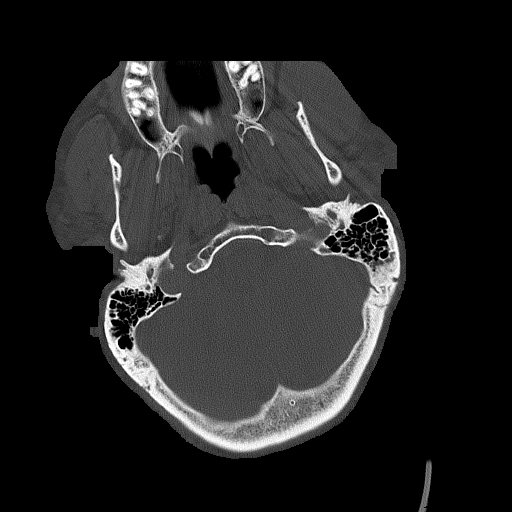
[im 29/95  bone]
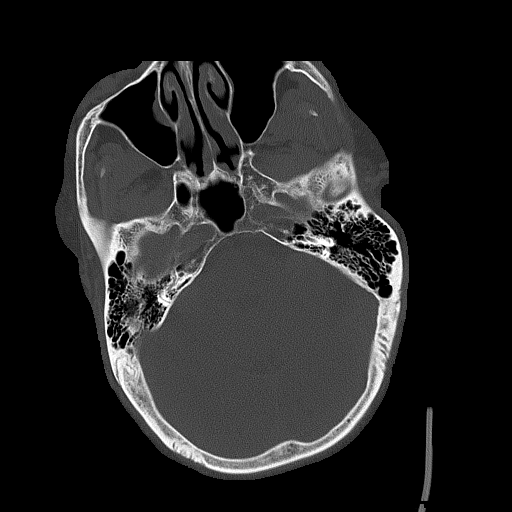
[im 43/95  bone]
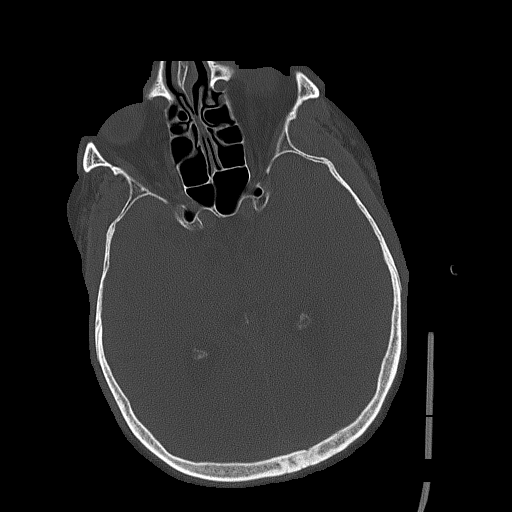
[im 52/95  bone]
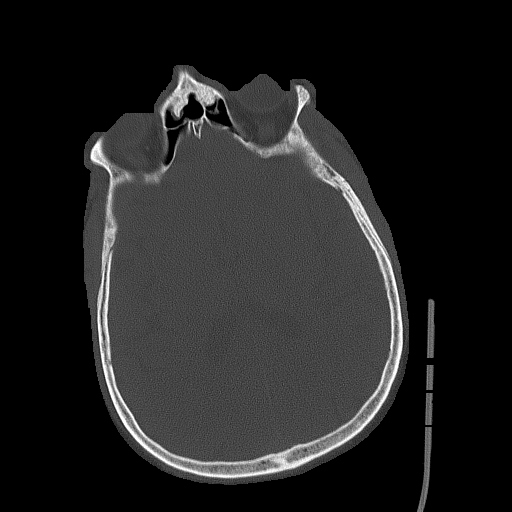
[im 66/95  bone]
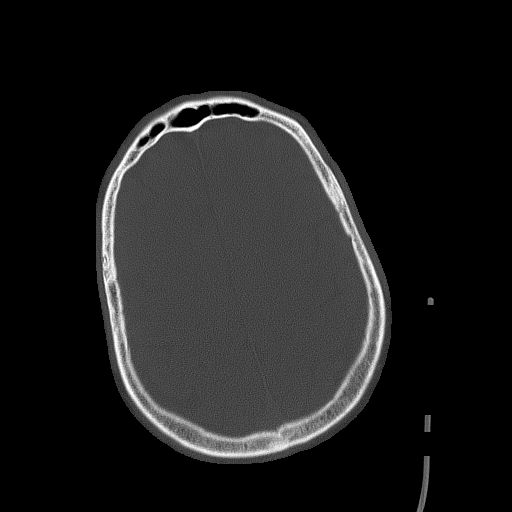
[im 76/95  bone]
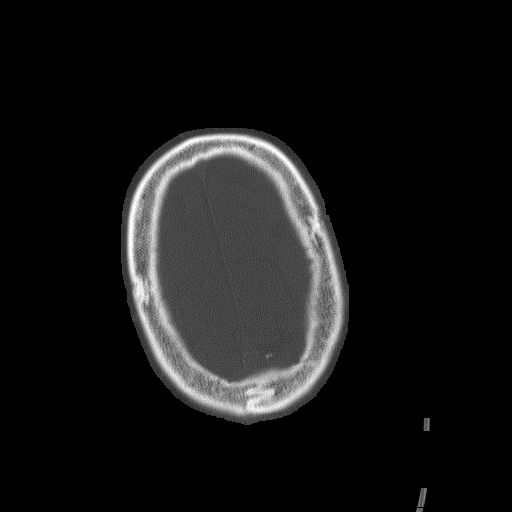

[Series 6: head without sag · sagittal · non-contrast · 0.33mm/px · 3 of 57 slices shown]
[im 19/57  brain]
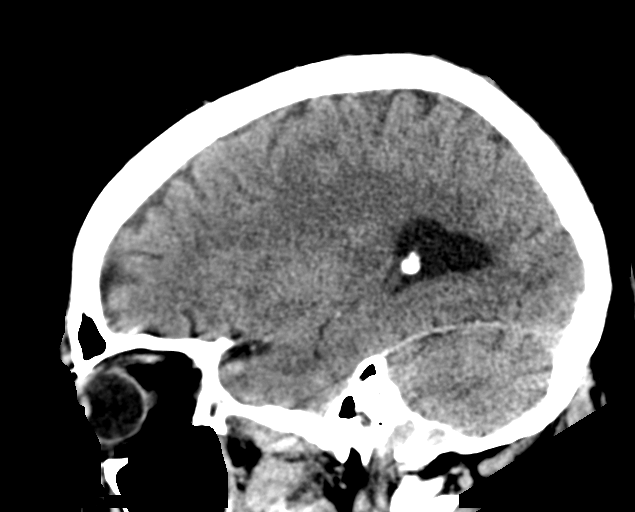
[im 29/57  brain]
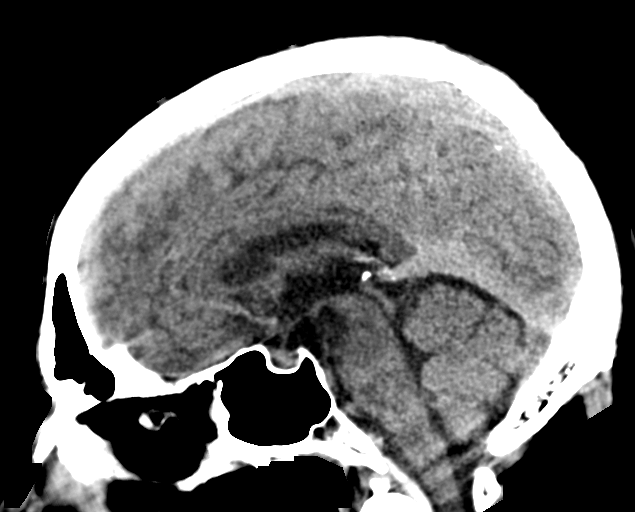
[im 38/57  brain]
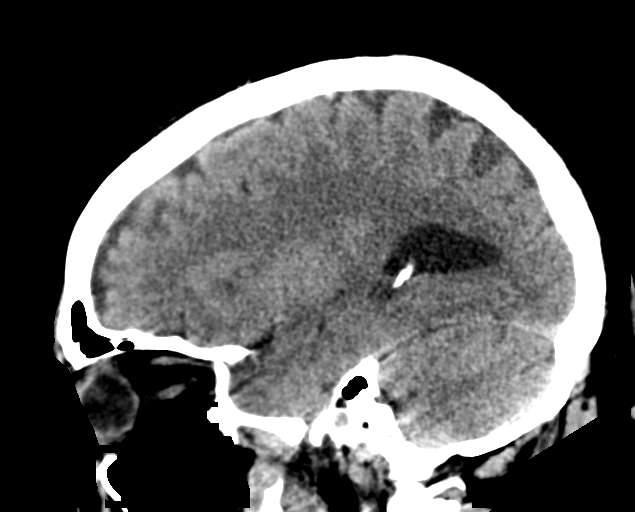

[17 of 37 positions shown; findings below may reference images not displayed]

FINDINGS: Brain: There is no acute intracranial hemorrhage, mass-effect, or
edema. Gray-white differentiation is preserved. There is no
extra-axial fluid collection. Ventricles and sulci are within normal
limits in size and configuration. Patchy hypoattenuation in the
supratentorial white matter is nonspecific may reflect mild chronic
microvascular ischemic changes.

Vascular: No hyperdense vessel or unexpected calcification.

Skull: Calvarium is unremarkable.

Sinuses/Orbits: Minor mucosal thickening.  Orbits are unremarkable.

Other: None.
IMPRESSION: No acute intracranial hemorrhage, mass effect, or evidence of acute
infarction.

## 2021-06-15 IMAGING — RF DG C-ARM 1-60 MIN
1 series · 2 of 2 positions shown · non-contrast
Comparison: None.

CLINICAL DATA: Intraop thoracolumbar spine fusion

EXAM:
THORACOLUMBAR SPINE 1V

[Series 1: unknown protocol · 0.14mm/px · 2 of 2 slices shown]
[im 1/2]
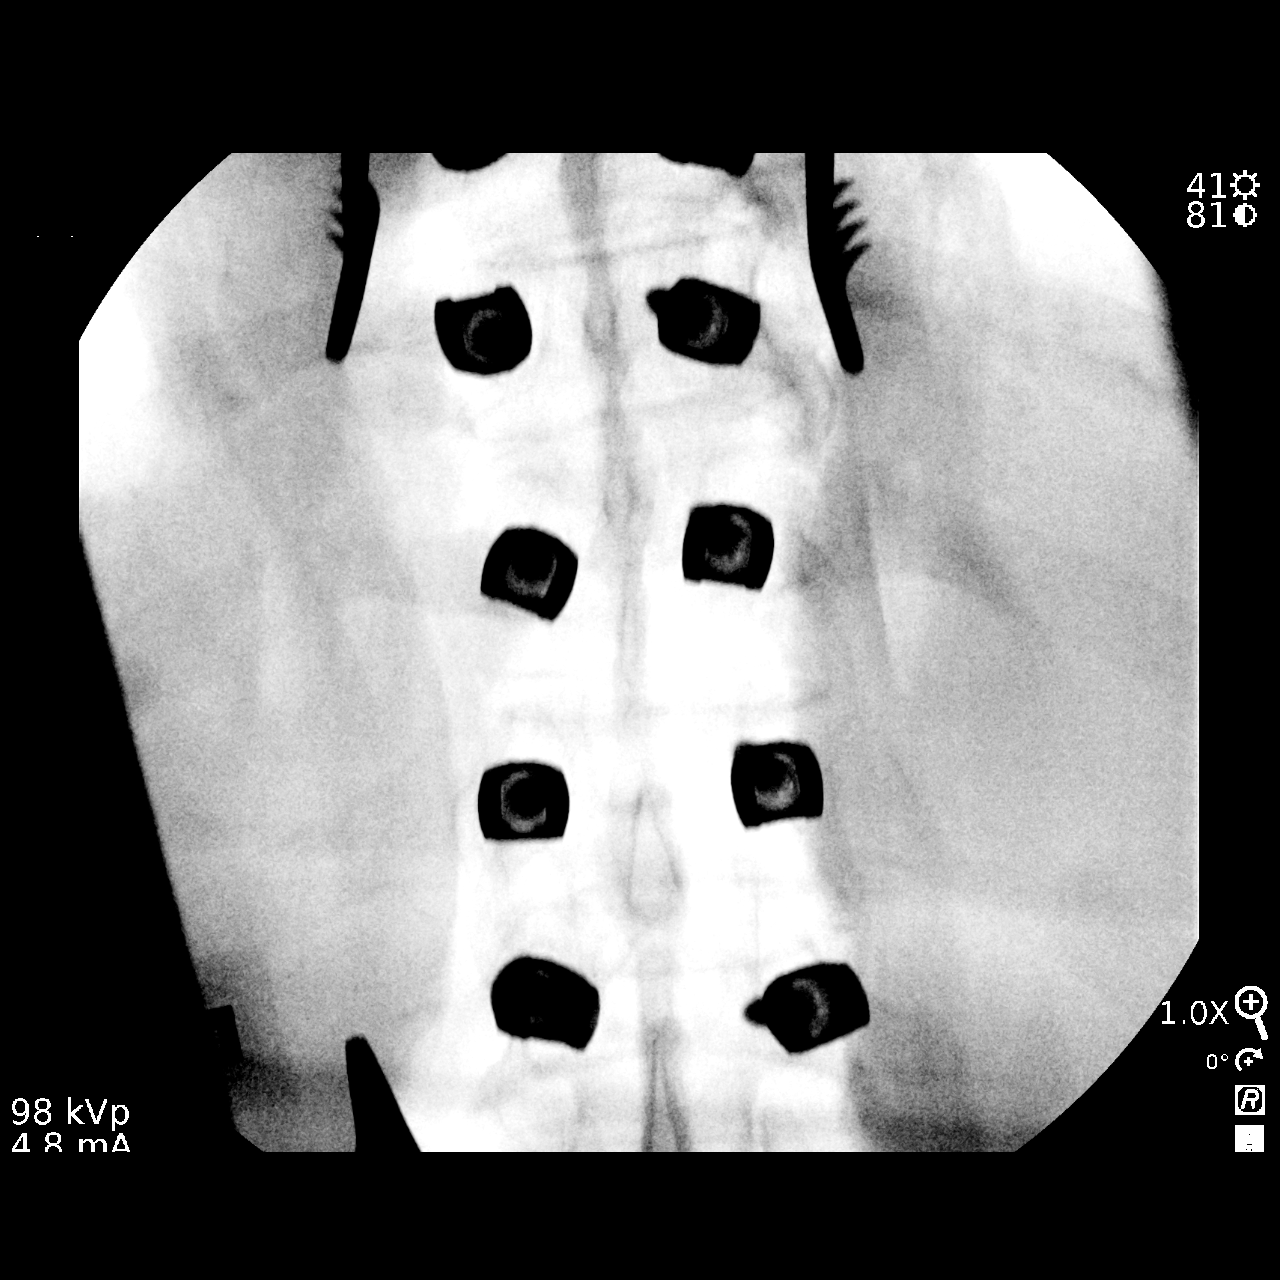
[im 2/2]
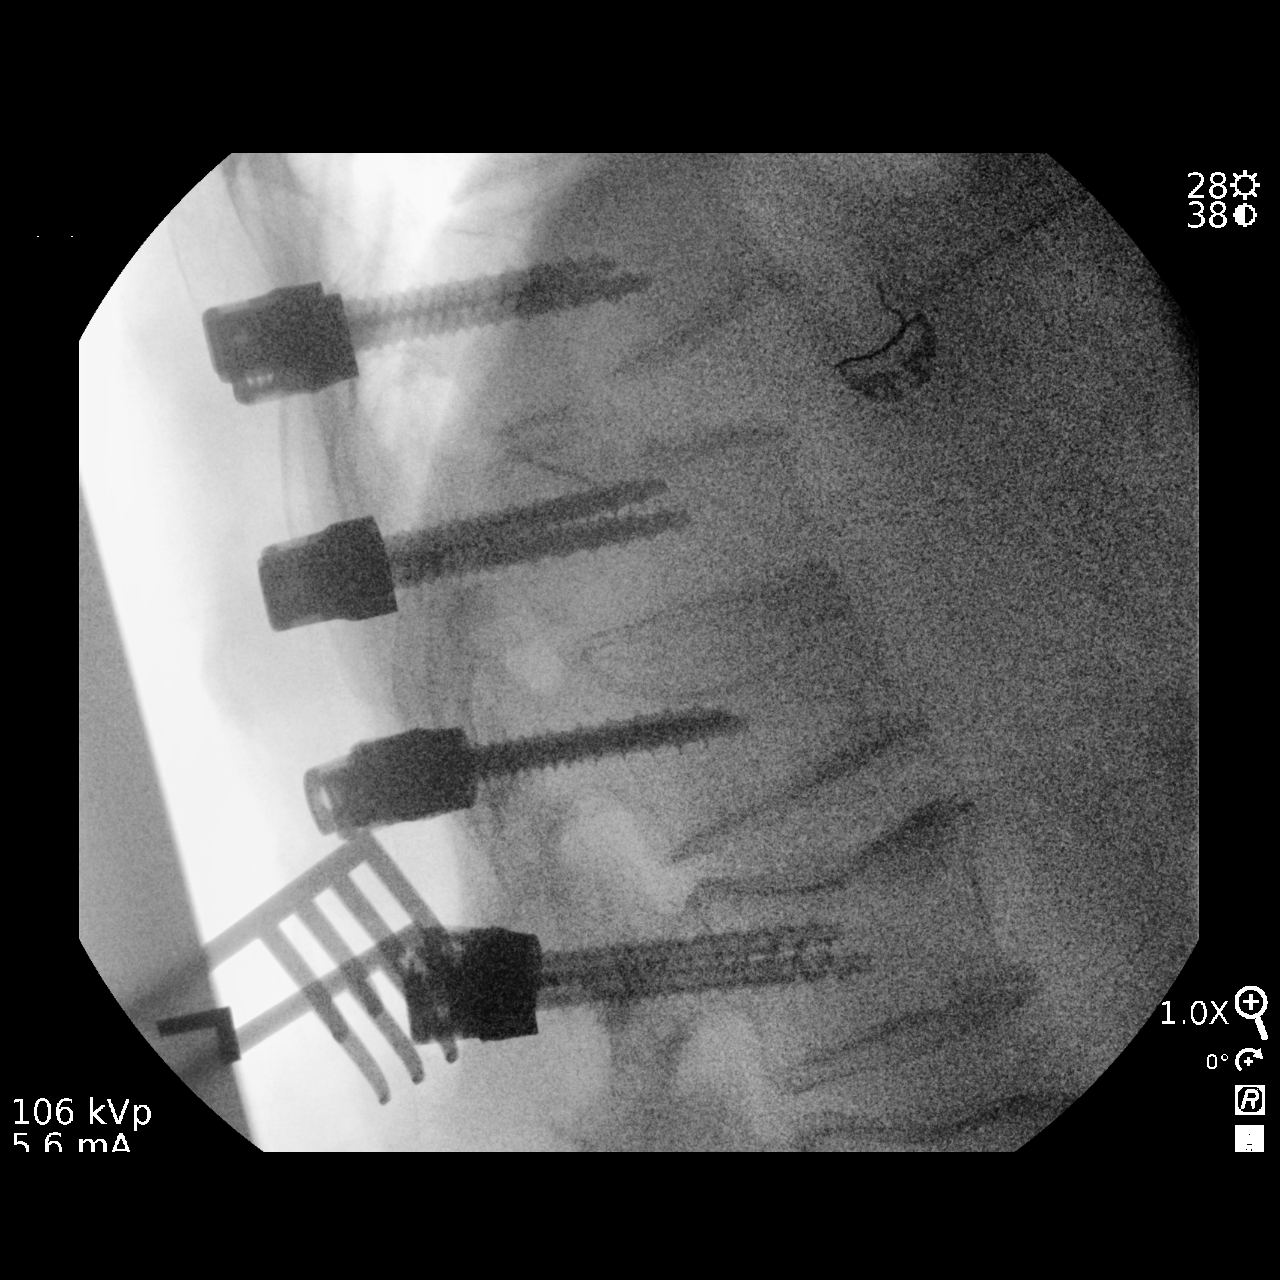

[2 of 2 positions shown; findings below may reference images not displayed]

FINDINGS: Two intraop fluoroscopic guided images were obtained of
thoracolumbar spine posterior fusion. Transpedicular screws are
noted. Fluoro time 55 seconds
IMPRESSION: Intraop views from thoracolumbar spine fusion.

## 2021-06-24 DIAGNOSIS — M47812 Spondylosis without myelopathy or radiculopathy, cervical region: Secondary | ICD-10-CM | POA: Diagnosis not present

## 2021-06-24 DIAGNOSIS — G9520 Unspecified cord compression: Secondary | ICD-10-CM | POA: Diagnosis present

## 2021-06-24 DIAGNOSIS — S22062A Unstable burst fracture of T7-T8 vertebra, initial encounter for closed fracture: Secondary | ICD-10-CM | POA: Diagnosis not present

## 2021-06-24 DIAGNOSIS — S32039A Unspecified fracture of third lumbar vertebra, initial encounter for closed fracture: Secondary | ICD-10-CM | POA: Diagnosis present

## 2021-06-24 DIAGNOSIS — S42352A Displaced comminuted fracture of shaft of humerus, left arm, initial encounter for closed fracture: Secondary | ICD-10-CM | POA: Diagnosis not present

## 2021-06-24 DIAGNOSIS — S24102A Unspecified injury at T2-T6 level of thoracic spinal cord, initial encounter: Secondary | ICD-10-CM | POA: Diagnosis present

## 2021-06-24 DIAGNOSIS — S30810A Abrasion of lower back and pelvis, initial encounter: Secondary | ICD-10-CM | POA: Diagnosis present

## 2021-06-24 DIAGNOSIS — S42402A Unspecified fracture of lower end of left humerus, initial encounter for closed fracture: Secondary | ICD-10-CM | POA: Diagnosis not present

## 2021-06-24 DIAGNOSIS — S80211A Abrasion, right knee, initial encounter: Secondary | ICD-10-CM | POA: Diagnosis present

## 2021-06-24 DIAGNOSIS — Z9981 Dependence on supplemental oxygen: Secondary | ICD-10-CM | POA: Diagnosis not present

## 2021-06-24 DIAGNOSIS — S32038A Other fracture of third lumbar vertebra, initial encounter for closed fracture: Secondary | ICD-10-CM | POA: Diagnosis not present

## 2021-06-24 DIAGNOSIS — M4800 Spinal stenosis, site unspecified: Secondary | ICD-10-CM | POA: Diagnosis not present

## 2021-06-24 DIAGNOSIS — S40812A Abrasion of left upper arm, initial encounter: Secondary | ICD-10-CM | POA: Diagnosis present

## 2021-06-24 DIAGNOSIS — S80212A Abrasion, left knee, initial encounter: Secondary | ICD-10-CM | POA: Diagnosis present

## 2021-06-24 DIAGNOSIS — R2689 Other abnormalities of gait and mobility: Secondary | ICD-10-CM | POA: Diagnosis not present

## 2021-06-24 DIAGNOSIS — R918 Other nonspecific abnormal finding of lung field: Secondary | ICD-10-CM | POA: Diagnosis not present

## 2021-06-24 DIAGNOSIS — S22069A Unspecified fracture of T7-T8 vertebra, initial encounter for closed fracture: Secondary | ICD-10-CM | POA: Diagnosis present

## 2021-06-24 DIAGNOSIS — S59902A Unspecified injury of left elbow, initial encounter: Secondary | ICD-10-CM | POA: Diagnosis not present

## 2021-06-24 DIAGNOSIS — S279XXA Injury of unspecified intrathoracic organ, initial encounter: Secondary | ICD-10-CM | POA: Diagnosis not present

## 2021-06-24 DIAGNOSIS — Z79891 Long term (current) use of opiate analgesic: Secondary | ICD-10-CM | POA: Diagnosis not present

## 2021-06-24 DIAGNOSIS — E861 Hypovolemia: Secondary | ICD-10-CM | POA: Diagnosis present

## 2021-06-24 DIAGNOSIS — R5381 Other malaise: Secondary | ICD-10-CM | POA: Diagnosis not present

## 2021-06-24 DIAGNOSIS — R279 Unspecified lack of coordination: Secondary | ICD-10-CM | POA: Diagnosis not present

## 2021-06-24 DIAGNOSIS — G8911 Acute pain due to trauma: Secondary | ICD-10-CM | POA: Diagnosis not present

## 2021-06-24 DIAGNOSIS — S52302A Unspecified fracture of shaft of left radius, initial encounter for closed fracture: Secondary | ICD-10-CM | POA: Diagnosis not present

## 2021-06-24 DIAGNOSIS — S2243XA Multiple fractures of ribs, bilateral, initial encounter for closed fracture: Secondary | ICD-10-CM | POA: Diagnosis present

## 2021-06-24 DIAGNOSIS — S3992XA Unspecified injury of lower back, initial encounter: Secondary | ICD-10-CM | POA: Diagnosis not present

## 2021-06-24 DIAGNOSIS — S50812A Abrasion of left forearm, initial encounter: Secondary | ICD-10-CM | POA: Diagnosis present

## 2021-06-24 DIAGNOSIS — S52602D Unspecified fracture of lower end of left ulna, subsequent encounter for closed fracture with routine healing: Secondary | ICD-10-CM | POA: Diagnosis not present

## 2021-06-24 DIAGNOSIS — S42402D Unspecified fracture of lower end of left humerus, subsequent encounter for fracture with routine healing: Secondary | ICD-10-CM | POA: Diagnosis not present

## 2021-06-24 DIAGNOSIS — J939 Pneumothorax, unspecified: Secondary | ICD-10-CM | POA: Diagnosis not present

## 2021-06-24 DIAGNOSIS — S0292XA Unspecified fracture of facial bones, initial encounter for closed fracture: Secondary | ICD-10-CM | POA: Diagnosis not present

## 2021-06-24 DIAGNOSIS — S7002XA Contusion of left hip, initial encounter: Secondary | ICD-10-CM | POA: Diagnosis present

## 2021-06-24 DIAGNOSIS — M1612 Unilateral primary osteoarthritis, left hip: Secondary | ICD-10-CM | POA: Diagnosis not present

## 2021-06-24 DIAGNOSIS — S52022A Displaced fracture of olecranon process without intraarticular extension of left ulna, initial encounter for closed fracture: Secondary | ICD-10-CM | POA: Diagnosis present

## 2021-06-24 DIAGNOSIS — S62307A Unspecified fracture of fifth metacarpal bone, left hand, initial encounter for closed fracture: Secondary | ICD-10-CM | POA: Diagnosis not present

## 2021-06-24 DIAGNOSIS — M4726 Other spondylosis with radiculopathy, lumbar region: Secondary | ICD-10-CM | POA: Diagnosis not present

## 2021-06-24 DIAGNOSIS — M4803 Spinal stenosis, cervicothoracic region: Secondary | ICD-10-CM | POA: Diagnosis present

## 2021-06-24 DIAGNOSIS — S270XXA Traumatic pneumothorax, initial encounter: Secondary | ICD-10-CM | POA: Diagnosis not present

## 2021-06-24 DIAGNOSIS — D62 Acute posthemorrhagic anemia: Secondary | ICD-10-CM | POA: Diagnosis present

## 2021-06-24 DIAGNOSIS — S42412B Displaced simple supracondylar fracture without intercondylar fracture of left humerus, initial encounter for open fracture: Secondary | ICD-10-CM | POA: Diagnosis not present

## 2021-06-24 DIAGNOSIS — G40909 Epilepsy, unspecified, not intractable, without status epilepticus: Secondary | ICD-10-CM | POA: Diagnosis present

## 2021-06-24 DIAGNOSIS — S199XXA Unspecified injury of neck, initial encounter: Secondary | ICD-10-CM | POA: Diagnosis not present

## 2021-06-24 DIAGNOSIS — S52002A Unspecified fracture of upper end of left ulna, initial encounter for closed fracture: Secondary | ICD-10-CM | POA: Diagnosis not present

## 2021-06-24 DIAGNOSIS — S62317A Displaced fracture of base of fifth metacarpal bone. left hand, initial encounter for closed fracture: Secondary | ICD-10-CM | POA: Diagnosis not present

## 2021-06-24 DIAGNOSIS — Z981 Arthrodesis status: Secondary | ICD-10-CM | POA: Diagnosis not present

## 2021-06-24 DIAGNOSIS — S51012A Laceration without foreign body of left elbow, initial encounter: Secondary | ICD-10-CM | POA: Diagnosis not present

## 2021-06-24 DIAGNOSIS — M6259 Muscle wasting and atrophy, not elsewhere classified, multiple sites: Secondary | ICD-10-CM | POA: Diagnosis not present

## 2021-06-24 DIAGNOSIS — K429 Umbilical hernia without obstruction or gangrene: Secondary | ICD-10-CM | POA: Diagnosis present

## 2021-06-24 DIAGNOSIS — S62337A Displaced fracture of neck of fifth metacarpal bone, left hand, initial encounter for closed fracture: Secondary | ICD-10-CM | POA: Diagnosis not present

## 2021-06-24 DIAGNOSIS — S32030D Wedge compression fracture of third lumbar vertebra, subsequent encounter for fracture with routine healing: Secondary | ICD-10-CM | POA: Diagnosis not present

## 2021-06-24 DIAGNOSIS — S2243XD Multiple fractures of ribs, bilateral, subsequent encounter for fracture with routine healing: Secondary | ICD-10-CM | POA: Diagnosis not present

## 2021-06-24 DIAGNOSIS — S22069D Unspecified fracture of T7-T8 vertebra, subsequent encounter for fracture with routine healing: Secondary | ICD-10-CM | POA: Diagnosis not present

## 2021-06-24 DIAGNOSIS — G4089 Other seizures: Secondary | ICD-10-CM | POA: Diagnosis not present

## 2021-06-24 DIAGNOSIS — M6281 Muscle weakness (generalized): Secondary | ICD-10-CM | POA: Diagnosis not present

## 2021-06-24 DIAGNOSIS — S62309D Unspecified fracture of unspecified metacarpal bone, subsequent encounter for fracture with routine healing: Secondary | ICD-10-CM | POA: Diagnosis not present

## 2021-06-24 DIAGNOSIS — J9811 Atelectasis: Secondary | ICD-10-CM | POA: Diagnosis present

## 2021-06-24 DIAGNOSIS — M1812 Unilateral primary osteoarthritis of first carpometacarpal joint, left hand: Secondary | ICD-10-CM | POA: Diagnosis not present

## 2021-06-24 DIAGNOSIS — S2242XA Multiple fractures of ribs, left side, initial encounter for closed fracture: Secondary | ICD-10-CM | POA: Diagnosis not present

## 2021-06-24 DIAGNOSIS — C61 Malignant neoplasm of prostate: Secondary | ICD-10-CM | POA: Diagnosis not present

## 2021-06-24 DIAGNOSIS — R278 Other lack of coordination: Secondary | ICD-10-CM | POA: Diagnosis not present

## 2021-06-24 DIAGNOSIS — J8410 Pulmonary fibrosis, unspecified: Secondary | ICD-10-CM | POA: Diagnosis present

## 2021-06-24 DIAGNOSIS — Z4789 Encounter for other orthopedic aftercare: Secondary | ICD-10-CM | POA: Diagnosis not present

## 2021-06-24 DIAGNOSIS — S32029D Unspecified fracture of second lumbar vertebra, subsequent encounter for fracture with routine healing: Secondary | ICD-10-CM | POA: Diagnosis not present

## 2021-06-24 DIAGNOSIS — S42402B Unspecified fracture of lower end of left humerus, initial encounter for open fracture: Secondary | ICD-10-CM | POA: Diagnosis not present

## 2021-06-24 DIAGNOSIS — Z043 Encounter for examination and observation following other accident: Secondary | ICD-10-CM | POA: Diagnosis not present

## 2021-06-24 DIAGNOSIS — M48061 Spinal stenosis, lumbar region without neurogenic claudication: Secondary | ICD-10-CM | POA: Diagnosis not present

## 2021-06-24 DIAGNOSIS — S3993XA Unspecified injury of pelvis, initial encounter: Secondary | ICD-10-CM | POA: Diagnosis not present

## 2021-06-24 DIAGNOSIS — S0990XA Unspecified injury of head, initial encounter: Secondary | ICD-10-CM | POA: Diagnosis not present

## 2021-06-24 DIAGNOSIS — R0602 Shortness of breath: Secondary | ICD-10-CM | POA: Diagnosis not present

## 2021-06-24 DIAGNOSIS — Z20822 Contact with and (suspected) exposure to covid-19: Secondary | ICD-10-CM | POA: Diagnosis present

## 2021-06-24 DIAGNOSIS — M4804 Spinal stenosis, thoracic region: Secondary | ICD-10-CM | POA: Diagnosis not present

## 2021-06-25 DIAGNOSIS — S42352A Displaced comminuted fracture of shaft of humerus, left arm, initial encounter for closed fracture: Secondary | ICD-10-CM | POA: Diagnosis not present

## 2021-06-25 DIAGNOSIS — S22062A Unstable burst fracture of T7-T8 vertebra, initial encounter for closed fracture: Secondary | ICD-10-CM | POA: Diagnosis not present

## 2021-06-25 DIAGNOSIS — S52002A Unspecified fracture of upper end of left ulna, initial encounter for closed fracture: Secondary | ICD-10-CM | POA: Diagnosis not present

## 2021-06-25 DIAGNOSIS — S62307A Unspecified fracture of fifth metacarpal bone, left hand, initial encounter for closed fracture: Secondary | ICD-10-CM | POA: Diagnosis not present

## 2021-06-25 DIAGNOSIS — S42402B Unspecified fracture of lower end of left humerus, initial encounter for open fracture: Secondary | ICD-10-CM | POA: Diagnosis not present

## 2021-06-26 DIAGNOSIS — J9811 Atelectasis: Secondary | ICD-10-CM | POA: Diagnosis not present

## 2021-06-26 DIAGNOSIS — J939 Pneumothorax, unspecified: Secondary | ICD-10-CM | POA: Diagnosis not present

## 2021-06-27 DIAGNOSIS — J9811 Atelectasis: Secondary | ICD-10-CM | POA: Diagnosis not present

## 2021-06-27 DIAGNOSIS — J939 Pneumothorax, unspecified: Secondary | ICD-10-CM | POA: Diagnosis not present

## 2021-06-28 DIAGNOSIS — Z981 Arthrodesis status: Secondary | ICD-10-CM | POA: Diagnosis not present

## 2021-07-21 DIAGNOSIS — W19XXXA Unspecified fall, initial encounter: Secondary | ICD-10-CM | POA: Diagnosis not present

## 2021-07-21 DIAGNOSIS — S62309D Unspecified fracture of unspecified metacarpal bone, subsequent encounter for fracture with routine healing: Secondary | ICD-10-CM | POA: Diagnosis not present

## 2021-07-21 DIAGNOSIS — S2243XD Multiple fractures of ribs, bilateral, subsequent encounter for fracture with routine healing: Secondary | ICD-10-CM | POA: Diagnosis not present

## 2021-07-21 DIAGNOSIS — M6281 Muscle weakness (generalized): Secondary | ICD-10-CM | POA: Diagnosis not present

## 2021-07-21 DIAGNOSIS — S32030D Wedge compression fracture of third lumbar vertebra, subsequent encounter for fracture with routine healing: Secondary | ICD-10-CM | POA: Diagnosis not present

## 2021-07-21 DIAGNOSIS — M25522 Pain in left elbow: Secondary | ICD-10-CM | POA: Diagnosis not present

## 2021-07-21 DIAGNOSIS — M79602 Pain in left arm: Secondary | ICD-10-CM | POA: Diagnosis not present

## 2021-07-21 DIAGNOSIS — C61 Malignant neoplasm of prostate: Secondary | ICD-10-CM | POA: Diagnosis not present

## 2021-07-21 DIAGNOSIS — S22069D Unspecified fracture of T7-T8 vertebra, subsequent encounter for fracture with routine healing: Secondary | ICD-10-CM | POA: Diagnosis not present

## 2021-07-21 DIAGNOSIS — M79632 Pain in left forearm: Secondary | ICD-10-CM | POA: Diagnosis not present

## 2021-07-21 DIAGNOSIS — S0292XA Unspecified fracture of facial bones, initial encounter for closed fracture: Secondary | ICD-10-CM | POA: Diagnosis not present

## 2021-07-21 DIAGNOSIS — S52602D Unspecified fracture of lower end of left ulna, subsequent encounter for closed fracture with routine healing: Secondary | ICD-10-CM | POA: Diagnosis not present

## 2021-07-21 DIAGNOSIS — R279 Unspecified lack of coordination: Secondary | ICD-10-CM | POA: Diagnosis not present

## 2021-07-21 DIAGNOSIS — R278 Other lack of coordination: Secondary | ICD-10-CM | POA: Diagnosis not present

## 2021-07-21 DIAGNOSIS — M4800 Spinal stenosis, site unspecified: Secondary | ICD-10-CM | POA: Diagnosis not present

## 2021-07-21 DIAGNOSIS — Z4789 Encounter for other orthopedic aftercare: Secondary | ICD-10-CM | POA: Diagnosis not present

## 2021-07-21 DIAGNOSIS — S42402D Unspecified fracture of lower end of left humerus, subsequent encounter for fracture with routine healing: Secondary | ICD-10-CM | POA: Diagnosis not present

## 2021-07-21 DIAGNOSIS — R5381 Other malaise: Secondary | ICD-10-CM | POA: Diagnosis not present

## 2021-07-21 DIAGNOSIS — R2689 Other abnormalities of gait and mobility: Secondary | ICD-10-CM | POA: Diagnosis not present

## 2021-07-21 DIAGNOSIS — M6259 Muscle wasting and atrophy, not elsewhere classified, multiple sites: Secondary | ICD-10-CM | POA: Diagnosis not present

## 2021-07-21 DIAGNOSIS — G4089 Other seizures: Secondary | ICD-10-CM | POA: Diagnosis not present

## 2021-07-24 DIAGNOSIS — S2243XD Multiple fractures of ribs, bilateral, subsequent encounter for fracture with routine healing: Secondary | ICD-10-CM | POA: Diagnosis not present

## 2021-07-24 DIAGNOSIS — C61 Malignant neoplasm of prostate: Secondary | ICD-10-CM | POA: Diagnosis not present

## 2021-07-24 DIAGNOSIS — S22069D Unspecified fracture of T7-T8 vertebra, subsequent encounter for fracture with routine healing: Secondary | ICD-10-CM | POA: Diagnosis not present

## 2021-07-24 DIAGNOSIS — S42402D Unspecified fracture of lower end of left humerus, subsequent encounter for fracture with routine healing: Secondary | ICD-10-CM | POA: Diagnosis not present

## 2021-07-24 DIAGNOSIS — G4089 Other seizures: Secondary | ICD-10-CM | POA: Diagnosis not present

## 2021-07-26 DIAGNOSIS — G4089 Other seizures: Secondary | ICD-10-CM | POA: Diagnosis not present

## 2021-07-26 DIAGNOSIS — S22069D Unspecified fracture of T7-T8 vertebra, subsequent encounter for fracture with routine healing: Secondary | ICD-10-CM | POA: Diagnosis not present

## 2021-07-26 DIAGNOSIS — S2243XD Multiple fractures of ribs, bilateral, subsequent encounter for fracture with routine healing: Secondary | ICD-10-CM | POA: Diagnosis not present

## 2021-07-26 DIAGNOSIS — C61 Malignant neoplasm of prostate: Secondary | ICD-10-CM | POA: Diagnosis not present

## 2021-07-26 DIAGNOSIS — S42402D Unspecified fracture of lower end of left humerus, subsequent encounter for fracture with routine healing: Secondary | ICD-10-CM | POA: Diagnosis not present

## 2021-08-03 DIAGNOSIS — W19XXXA Unspecified fall, initial encounter: Secondary | ICD-10-CM | POA: Diagnosis not present

## 2021-08-03 DIAGNOSIS — S42402D Unspecified fracture of lower end of left humerus, subsequent encounter for fracture with routine healing: Secondary | ICD-10-CM | POA: Diagnosis not present

## 2021-08-03 DIAGNOSIS — S2243XD Multiple fractures of ribs, bilateral, subsequent encounter for fracture with routine healing: Secondary | ICD-10-CM | POA: Diagnosis not present

## 2021-08-03 DIAGNOSIS — G4089 Other seizures: Secondary | ICD-10-CM | POA: Diagnosis not present

## 2021-08-07 DIAGNOSIS — M549 Dorsalgia, unspecified: Secondary | ICD-10-CM | POA: Diagnosis not present

## 2021-08-07 DIAGNOSIS — S62317A Displaced fracture of base of fifth metacarpal bone. left hand, initial encounter for closed fracture: Secondary | ICD-10-CM | POA: Diagnosis not present

## 2021-08-07 DIAGNOSIS — S42402A Unspecified fracture of lower end of left humerus, initial encounter for closed fracture: Secondary | ICD-10-CM | POA: Diagnosis not present

## 2021-08-07 DIAGNOSIS — S62317D Displaced fracture of base of fifth metacarpal bone. left hand, subsequent encounter for fracture with routine healing: Secondary | ICD-10-CM | POA: Diagnosis not present

## 2021-08-07 DIAGNOSIS — S42402D Unspecified fracture of lower end of left humerus, subsequent encounter for fracture with routine healing: Secondary | ICD-10-CM | POA: Diagnosis not present

## 2021-08-07 DIAGNOSIS — S42402B Unspecified fracture of lower end of left humerus, initial encounter for open fracture: Secondary | ICD-10-CM | POA: Diagnosis not present

## 2021-08-07 DIAGNOSIS — S62327A Displaced fracture of shaft of fifth metacarpal bone, left hand, initial encounter for closed fracture: Secondary | ICD-10-CM | POA: Diagnosis not present

## 2021-08-07 DIAGNOSIS — S42352D Displaced comminuted fracture of shaft of humerus, left arm, subsequent encounter for fracture with routine healing: Secondary | ICD-10-CM | POA: Diagnosis not present

## 2021-08-08 DIAGNOSIS — Z79899 Other long term (current) drug therapy: Secondary | ICD-10-CM | POA: Diagnosis not present

## 2021-08-08 DIAGNOSIS — N451 Epididymitis: Secondary | ICD-10-CM | POA: Diagnosis not present

## 2021-08-08 DIAGNOSIS — S62307A Unspecified fracture of fifth metacarpal bone, left hand, initial encounter for closed fracture: Secondary | ICD-10-CM | POA: Diagnosis not present

## 2021-08-08 DIAGNOSIS — M5416 Radiculopathy, lumbar region: Secondary | ICD-10-CM | POA: Diagnosis not present

## 2021-08-08 DIAGNOSIS — Z6830 Body mass index (BMI) 30.0-30.9, adult: Secondary | ICD-10-CM | POA: Diagnosis not present

## 2021-08-08 DIAGNOSIS — S22009A Unspecified fracture of unspecified thoracic vertebra, initial encounter for closed fracture: Secondary | ICD-10-CM | POA: Diagnosis not present

## 2021-08-08 DIAGNOSIS — M810 Age-related osteoporosis without current pathological fracture: Secondary | ICD-10-CM | POA: Diagnosis not present

## 2021-08-08 DIAGNOSIS — S42409A Unspecified fracture of lower end of unspecified humerus, initial encounter for closed fracture: Secondary | ICD-10-CM | POA: Diagnosis not present

## 2021-08-08 DIAGNOSIS — S2249XA Multiple fractures of ribs, unspecified side, initial encounter for closed fracture: Secondary | ICD-10-CM | POA: Diagnosis not present

## 2021-08-08 DIAGNOSIS — J8410 Pulmonary fibrosis, unspecified: Secondary | ICD-10-CM | POA: Diagnosis not present

## 2021-08-09 DIAGNOSIS — S42402D Unspecified fracture of lower end of left humerus, subsequent encounter for fracture with routine healing: Secondary | ICD-10-CM | POA: Diagnosis not present

## 2021-08-09 DIAGNOSIS — Z981 Arthrodesis status: Secondary | ICD-10-CM | POA: Diagnosis not present

## 2021-08-09 DIAGNOSIS — S2243XD Multiple fractures of ribs, bilateral, subsequent encounter for fracture with routine healing: Secondary | ICD-10-CM | POA: Diagnosis not present

## 2021-08-09 DIAGNOSIS — M4800 Spinal stenosis, site unspecified: Secondary | ICD-10-CM | POA: Diagnosis not present

## 2021-08-09 DIAGNOSIS — Z4789 Encounter for other orthopedic aftercare: Secondary | ICD-10-CM | POA: Diagnosis not present

## 2021-08-09 DIAGNOSIS — Z8546 Personal history of malignant neoplasm of prostate: Secondary | ICD-10-CM | POA: Diagnosis not present

## 2021-08-09 DIAGNOSIS — G4089 Other seizures: Secondary | ICD-10-CM | POA: Diagnosis not present

## 2021-08-09 DIAGNOSIS — S22069D Unspecified fracture of T7-T8 vertebra, subsequent encounter for fracture with routine healing: Secondary | ICD-10-CM | POA: Diagnosis not present

## 2021-08-09 DIAGNOSIS — S62327D Displaced fracture of shaft of fifth metacarpal bone, left hand, subsequent encounter for fracture with routine healing: Secondary | ICD-10-CM | POA: Diagnosis not present

## 2021-08-18 DIAGNOSIS — S62327D Displaced fracture of shaft of fifth metacarpal bone, left hand, subsequent encounter for fracture with routine healing: Secondary | ICD-10-CM | POA: Diagnosis not present

## 2021-08-18 DIAGNOSIS — S42402D Unspecified fracture of lower end of left humerus, subsequent encounter for fracture with routine healing: Secondary | ICD-10-CM | POA: Diagnosis not present

## 2021-08-18 DIAGNOSIS — S22069D Unspecified fracture of T7-T8 vertebra, subsequent encounter for fracture with routine healing: Secondary | ICD-10-CM | POA: Diagnosis not present

## 2021-08-18 DIAGNOSIS — S2243XD Multiple fractures of ribs, bilateral, subsequent encounter for fracture with routine healing: Secondary | ICD-10-CM | POA: Diagnosis not present

## 2021-08-18 DIAGNOSIS — M4800 Spinal stenosis, site unspecified: Secondary | ICD-10-CM | POA: Diagnosis not present

## 2021-08-18 DIAGNOSIS — G4089 Other seizures: Secondary | ICD-10-CM | POA: Diagnosis not present

## 2021-08-21 DIAGNOSIS — S42402D Unspecified fracture of lower end of left humerus, subsequent encounter for fracture with routine healing: Secondary | ICD-10-CM | POA: Diagnosis not present

## 2021-08-21 DIAGNOSIS — G4089 Other seizures: Secondary | ICD-10-CM | POA: Diagnosis not present

## 2021-08-21 DIAGNOSIS — S62327D Displaced fracture of shaft of fifth metacarpal bone, left hand, subsequent encounter for fracture with routine healing: Secondary | ICD-10-CM | POA: Diagnosis not present

## 2021-08-21 DIAGNOSIS — M4800 Spinal stenosis, site unspecified: Secondary | ICD-10-CM | POA: Diagnosis not present

## 2021-08-21 DIAGNOSIS — S2243XD Multiple fractures of ribs, bilateral, subsequent encounter for fracture with routine healing: Secondary | ICD-10-CM | POA: Diagnosis not present

## 2021-08-21 DIAGNOSIS — S22069D Unspecified fracture of T7-T8 vertebra, subsequent encounter for fracture with routine healing: Secondary | ICD-10-CM | POA: Diagnosis not present

## 2021-08-23 DIAGNOSIS — S22069D Unspecified fracture of T7-T8 vertebra, subsequent encounter for fracture with routine healing: Secondary | ICD-10-CM | POA: Diagnosis not present

## 2021-08-23 DIAGNOSIS — S2243XD Multiple fractures of ribs, bilateral, subsequent encounter for fracture with routine healing: Secondary | ICD-10-CM | POA: Diagnosis not present

## 2021-08-23 DIAGNOSIS — S42402D Unspecified fracture of lower end of left humerus, subsequent encounter for fracture with routine healing: Secondary | ICD-10-CM | POA: Diagnosis not present

## 2021-08-23 DIAGNOSIS — G4089 Other seizures: Secondary | ICD-10-CM | POA: Diagnosis not present

## 2021-08-23 DIAGNOSIS — M4800 Spinal stenosis, site unspecified: Secondary | ICD-10-CM | POA: Diagnosis not present

## 2021-08-23 DIAGNOSIS — S62327D Displaced fracture of shaft of fifth metacarpal bone, left hand, subsequent encounter for fracture with routine healing: Secondary | ICD-10-CM | POA: Diagnosis not present

## 2021-08-24 DIAGNOSIS — M4856XA Collapsed vertebra, not elsewhere classified, lumbar region, initial encounter for fracture: Secondary | ICD-10-CM | POA: Diagnosis not present

## 2021-08-24 DIAGNOSIS — M4325 Fusion of spine, thoracolumbar region: Secondary | ICD-10-CM | POA: Diagnosis not present

## 2021-08-24 DIAGNOSIS — R29890 Loss of height: Secondary | ICD-10-CM | POA: Diagnosis not present

## 2021-08-28 DIAGNOSIS — G4089 Other seizures: Secondary | ICD-10-CM | POA: Diagnosis not present

## 2021-08-28 DIAGNOSIS — S62327D Displaced fracture of shaft of fifth metacarpal bone, left hand, subsequent encounter for fracture with routine healing: Secondary | ICD-10-CM | POA: Diagnosis not present

## 2021-08-28 DIAGNOSIS — S2243XD Multiple fractures of ribs, bilateral, subsequent encounter for fracture with routine healing: Secondary | ICD-10-CM | POA: Diagnosis not present

## 2021-08-28 DIAGNOSIS — S42402D Unspecified fracture of lower end of left humerus, subsequent encounter for fracture with routine healing: Secondary | ICD-10-CM | POA: Diagnosis not present

## 2021-08-28 DIAGNOSIS — M4800 Spinal stenosis, site unspecified: Secondary | ICD-10-CM | POA: Diagnosis not present

## 2021-08-28 DIAGNOSIS — S22069D Unspecified fracture of T7-T8 vertebra, subsequent encounter for fracture with routine healing: Secondary | ICD-10-CM | POA: Diagnosis not present

## 2021-08-29 DIAGNOSIS — S62327D Displaced fracture of shaft of fifth metacarpal bone, left hand, subsequent encounter for fracture with routine healing: Secondary | ICD-10-CM | POA: Diagnosis not present

## 2021-08-29 DIAGNOSIS — G4089 Other seizures: Secondary | ICD-10-CM | POA: Diagnosis not present

## 2021-08-29 DIAGNOSIS — S42402D Unspecified fracture of lower end of left humerus, subsequent encounter for fracture with routine healing: Secondary | ICD-10-CM | POA: Diagnosis not present

## 2021-08-29 DIAGNOSIS — S2243XD Multiple fractures of ribs, bilateral, subsequent encounter for fracture with routine healing: Secondary | ICD-10-CM | POA: Diagnosis not present

## 2021-08-29 DIAGNOSIS — M4800 Spinal stenosis, site unspecified: Secondary | ICD-10-CM | POA: Diagnosis not present

## 2021-08-29 DIAGNOSIS — S22069D Unspecified fracture of T7-T8 vertebra, subsequent encounter for fracture with routine healing: Secondary | ICD-10-CM | POA: Diagnosis not present

## 2021-08-31 DIAGNOSIS — S42402D Unspecified fracture of lower end of left humerus, subsequent encounter for fracture with routine healing: Secondary | ICD-10-CM | POA: Diagnosis not present

## 2021-08-31 DIAGNOSIS — S62327D Displaced fracture of shaft of fifth metacarpal bone, left hand, subsequent encounter for fracture with routine healing: Secondary | ICD-10-CM | POA: Diagnosis not present

## 2021-08-31 DIAGNOSIS — S52022D Displaced fracture of olecranon process without intraarticular extension of left ulna, subsequent encounter for closed fracture with routine healing: Secondary | ICD-10-CM | POA: Diagnosis not present

## 2021-08-31 DIAGNOSIS — S42402B Unspecified fracture of lower end of left humerus, initial encounter for open fracture: Secondary | ICD-10-CM | POA: Diagnosis not present

## 2021-09-01 DIAGNOSIS — S42402D Unspecified fracture of lower end of left humerus, subsequent encounter for fracture with routine healing: Secondary | ICD-10-CM | POA: Diagnosis not present

## 2021-09-01 DIAGNOSIS — G4089 Other seizures: Secondary | ICD-10-CM | POA: Diagnosis not present

## 2021-09-01 DIAGNOSIS — S22069D Unspecified fracture of T7-T8 vertebra, subsequent encounter for fracture with routine healing: Secondary | ICD-10-CM | POA: Diagnosis not present

## 2021-09-01 DIAGNOSIS — M4800 Spinal stenosis, site unspecified: Secondary | ICD-10-CM | POA: Diagnosis not present

## 2021-09-01 DIAGNOSIS — S2243XD Multiple fractures of ribs, bilateral, subsequent encounter for fracture with routine healing: Secondary | ICD-10-CM | POA: Diagnosis not present

## 2021-09-01 DIAGNOSIS — S62327D Displaced fracture of shaft of fifth metacarpal bone, left hand, subsequent encounter for fracture with routine healing: Secondary | ICD-10-CM | POA: Diagnosis not present

## 2021-09-04 DIAGNOSIS — S22069D Unspecified fracture of T7-T8 vertebra, subsequent encounter for fracture with routine healing: Secondary | ICD-10-CM | POA: Diagnosis not present

## 2021-09-04 DIAGNOSIS — M4800 Spinal stenosis, site unspecified: Secondary | ICD-10-CM | POA: Diagnosis not present

## 2021-09-04 DIAGNOSIS — S2243XD Multiple fractures of ribs, bilateral, subsequent encounter for fracture with routine healing: Secondary | ICD-10-CM | POA: Diagnosis not present

## 2021-09-04 DIAGNOSIS — S62327D Displaced fracture of shaft of fifth metacarpal bone, left hand, subsequent encounter for fracture with routine healing: Secondary | ICD-10-CM | POA: Diagnosis not present

## 2021-09-04 DIAGNOSIS — G4089 Other seizures: Secondary | ICD-10-CM | POA: Diagnosis not present

## 2021-09-04 DIAGNOSIS — S42402D Unspecified fracture of lower end of left humerus, subsequent encounter for fracture with routine healing: Secondary | ICD-10-CM | POA: Diagnosis not present

## 2021-09-07 DIAGNOSIS — S42402D Unspecified fracture of lower end of left humerus, subsequent encounter for fracture with routine healing: Secondary | ICD-10-CM | POA: Diagnosis not present

## 2021-09-07 DIAGNOSIS — S2243XD Multiple fractures of ribs, bilateral, subsequent encounter for fracture with routine healing: Secondary | ICD-10-CM | POA: Diagnosis not present

## 2021-09-07 DIAGNOSIS — S22069D Unspecified fracture of T7-T8 vertebra, subsequent encounter for fracture with routine healing: Secondary | ICD-10-CM | POA: Diagnosis not present

## 2021-09-07 DIAGNOSIS — G4089 Other seizures: Secondary | ICD-10-CM | POA: Diagnosis not present

## 2021-09-07 DIAGNOSIS — M4800 Spinal stenosis, site unspecified: Secondary | ICD-10-CM | POA: Diagnosis not present

## 2021-09-07 DIAGNOSIS — S62327D Displaced fracture of shaft of fifth metacarpal bone, left hand, subsequent encounter for fracture with routine healing: Secondary | ICD-10-CM | POA: Diagnosis not present

## 2021-09-08 DIAGNOSIS — S42409A Unspecified fracture of lower end of unspecified humerus, initial encounter for closed fracture: Secondary | ICD-10-CM | POA: Diagnosis not present

## 2021-09-08 DIAGNOSIS — S2243XD Multiple fractures of ribs, bilateral, subsequent encounter for fracture with routine healing: Secondary | ICD-10-CM | POA: Diagnosis not present

## 2021-09-08 DIAGNOSIS — J8410 Pulmonary fibrosis, unspecified: Secondary | ICD-10-CM | POA: Diagnosis not present

## 2021-09-08 DIAGNOSIS — M4800 Spinal stenosis, site unspecified: Secondary | ICD-10-CM | POA: Diagnosis not present

## 2021-09-08 DIAGNOSIS — Z9181 History of falling: Secondary | ICD-10-CM | POA: Diagnosis not present

## 2021-09-08 DIAGNOSIS — Z139 Encounter for screening, unspecified: Secondary | ICD-10-CM | POA: Diagnosis not present

## 2021-09-08 DIAGNOSIS — M5416 Radiculopathy, lumbar region: Secondary | ICD-10-CM | POA: Diagnosis not present

## 2021-09-08 DIAGNOSIS — Z981 Arthrodesis status: Secondary | ICD-10-CM | POA: Diagnosis not present

## 2021-09-08 DIAGNOSIS — Z6830 Body mass index (BMI) 30.0-30.9, adult: Secondary | ICD-10-CM | POA: Diagnosis not present

## 2021-09-08 DIAGNOSIS — S22069D Unspecified fracture of T7-T8 vertebra, subsequent encounter for fracture with routine healing: Secondary | ICD-10-CM | POA: Diagnosis not present

## 2021-09-08 DIAGNOSIS — S22009A Unspecified fracture of unspecified thoracic vertebra, initial encounter for closed fracture: Secondary | ICD-10-CM | POA: Diagnosis not present

## 2021-09-08 DIAGNOSIS — Z4789 Encounter for other orthopedic aftercare: Secondary | ICD-10-CM | POA: Diagnosis not present

## 2021-09-08 DIAGNOSIS — S62327D Displaced fracture of shaft of fifth metacarpal bone, left hand, subsequent encounter for fracture with routine healing: Secondary | ICD-10-CM | POA: Diagnosis not present

## 2021-09-08 DIAGNOSIS — S42402D Unspecified fracture of lower end of left humerus, subsequent encounter for fracture with routine healing: Secondary | ICD-10-CM | POA: Diagnosis not present

## 2021-09-08 DIAGNOSIS — Z8546 Personal history of malignant neoplasm of prostate: Secondary | ICD-10-CM | POA: Diagnosis not present

## 2021-09-08 DIAGNOSIS — G4089 Other seizures: Secondary | ICD-10-CM | POA: Diagnosis not present

## 2021-09-12 DIAGNOSIS — S62327D Displaced fracture of shaft of fifth metacarpal bone, left hand, subsequent encounter for fracture with routine healing: Secondary | ICD-10-CM | POA: Diagnosis not present

## 2021-09-12 DIAGNOSIS — M4800 Spinal stenosis, site unspecified: Secondary | ICD-10-CM | POA: Diagnosis not present

## 2021-09-12 DIAGNOSIS — G4089 Other seizures: Secondary | ICD-10-CM | POA: Diagnosis not present

## 2021-09-12 DIAGNOSIS — S22069D Unspecified fracture of T7-T8 vertebra, subsequent encounter for fracture with routine healing: Secondary | ICD-10-CM | POA: Diagnosis not present

## 2021-09-12 DIAGNOSIS — S2243XD Multiple fractures of ribs, bilateral, subsequent encounter for fracture with routine healing: Secondary | ICD-10-CM | POA: Diagnosis not present

## 2021-09-12 DIAGNOSIS — S42402D Unspecified fracture of lower end of left humerus, subsequent encounter for fracture with routine healing: Secondary | ICD-10-CM | POA: Diagnosis not present

## 2021-09-20 DIAGNOSIS — S22069D Unspecified fracture of T7-T8 vertebra, subsequent encounter for fracture with routine healing: Secondary | ICD-10-CM | POA: Diagnosis not present

## 2021-09-20 DIAGNOSIS — S2243XD Multiple fractures of ribs, bilateral, subsequent encounter for fracture with routine healing: Secondary | ICD-10-CM | POA: Diagnosis not present

## 2021-09-20 DIAGNOSIS — G4089 Other seizures: Secondary | ICD-10-CM | POA: Diagnosis not present

## 2021-09-20 DIAGNOSIS — M4800 Spinal stenosis, site unspecified: Secondary | ICD-10-CM | POA: Diagnosis not present

## 2021-09-20 DIAGNOSIS — S62327D Displaced fracture of shaft of fifth metacarpal bone, left hand, subsequent encounter for fracture with routine healing: Secondary | ICD-10-CM | POA: Diagnosis not present

## 2021-09-20 DIAGNOSIS — S42402D Unspecified fracture of lower end of left humerus, subsequent encounter for fracture with routine healing: Secondary | ICD-10-CM | POA: Diagnosis not present

## 2021-09-21 DIAGNOSIS — S62327D Displaced fracture of shaft of fifth metacarpal bone, left hand, subsequent encounter for fracture with routine healing: Secondary | ICD-10-CM | POA: Diagnosis not present

## 2021-09-21 DIAGNOSIS — S42402D Unspecified fracture of lower end of left humerus, subsequent encounter for fracture with routine healing: Secondary | ICD-10-CM | POA: Diagnosis not present

## 2021-09-21 DIAGNOSIS — G4089 Other seizures: Secondary | ICD-10-CM | POA: Diagnosis not present

## 2021-09-21 DIAGNOSIS — M4800 Spinal stenosis, site unspecified: Secondary | ICD-10-CM | POA: Diagnosis not present

## 2021-09-21 DIAGNOSIS — S22069D Unspecified fracture of T7-T8 vertebra, subsequent encounter for fracture with routine healing: Secondary | ICD-10-CM | POA: Diagnosis not present

## 2021-09-21 DIAGNOSIS — S2243XD Multiple fractures of ribs, bilateral, subsequent encounter for fracture with routine healing: Secondary | ICD-10-CM | POA: Diagnosis not present

## 2021-09-25 DIAGNOSIS — M4800 Spinal stenosis, site unspecified: Secondary | ICD-10-CM | POA: Diagnosis not present

## 2021-09-25 DIAGNOSIS — S22069D Unspecified fracture of T7-T8 vertebra, subsequent encounter for fracture with routine healing: Secondary | ICD-10-CM | POA: Diagnosis not present

## 2021-09-25 DIAGNOSIS — G4089 Other seizures: Secondary | ICD-10-CM | POA: Diagnosis not present

## 2021-09-25 DIAGNOSIS — S2243XD Multiple fractures of ribs, bilateral, subsequent encounter for fracture with routine healing: Secondary | ICD-10-CM | POA: Diagnosis not present

## 2021-09-25 DIAGNOSIS — S62327D Displaced fracture of shaft of fifth metacarpal bone, left hand, subsequent encounter for fracture with routine healing: Secondary | ICD-10-CM | POA: Diagnosis not present

## 2021-09-25 DIAGNOSIS — S42402D Unspecified fracture of lower end of left humerus, subsequent encounter for fracture with routine healing: Secondary | ICD-10-CM | POA: Diagnosis not present

## 2021-09-29 DIAGNOSIS — S62327D Displaced fracture of shaft of fifth metacarpal bone, left hand, subsequent encounter for fracture with routine healing: Secondary | ICD-10-CM | POA: Diagnosis not present

## 2021-09-29 DIAGNOSIS — G4089 Other seizures: Secondary | ICD-10-CM | POA: Diagnosis not present

## 2021-09-29 DIAGNOSIS — S42402D Unspecified fracture of lower end of left humerus, subsequent encounter for fracture with routine healing: Secondary | ICD-10-CM | POA: Diagnosis not present

## 2021-09-29 DIAGNOSIS — S22069D Unspecified fracture of T7-T8 vertebra, subsequent encounter for fracture with routine healing: Secondary | ICD-10-CM | POA: Diagnosis not present

## 2021-09-29 DIAGNOSIS — S2243XD Multiple fractures of ribs, bilateral, subsequent encounter for fracture with routine healing: Secondary | ICD-10-CM | POA: Diagnosis not present

## 2021-09-29 DIAGNOSIS — M4800 Spinal stenosis, site unspecified: Secondary | ICD-10-CM | POA: Diagnosis not present

## 2021-10-04 DIAGNOSIS — G4089 Other seizures: Secondary | ICD-10-CM | POA: Diagnosis not present

## 2021-10-04 DIAGNOSIS — S2243XD Multiple fractures of ribs, bilateral, subsequent encounter for fracture with routine healing: Secondary | ICD-10-CM | POA: Diagnosis not present

## 2021-10-04 DIAGNOSIS — M4800 Spinal stenosis, site unspecified: Secondary | ICD-10-CM | POA: Diagnosis not present

## 2021-10-04 DIAGNOSIS — S62327D Displaced fracture of shaft of fifth metacarpal bone, left hand, subsequent encounter for fracture with routine healing: Secondary | ICD-10-CM | POA: Diagnosis not present

## 2021-10-04 DIAGNOSIS — S22069D Unspecified fracture of T7-T8 vertebra, subsequent encounter for fracture with routine healing: Secondary | ICD-10-CM | POA: Diagnosis not present

## 2021-10-04 DIAGNOSIS — S42402D Unspecified fracture of lower end of left humerus, subsequent encounter for fracture with routine healing: Secondary | ICD-10-CM | POA: Diagnosis not present

## 2021-10-06 DIAGNOSIS — S42402D Unspecified fracture of lower end of left humerus, subsequent encounter for fracture with routine healing: Secondary | ICD-10-CM | POA: Diagnosis not present

## 2021-10-06 DIAGNOSIS — S22069D Unspecified fracture of T7-T8 vertebra, subsequent encounter for fracture with routine healing: Secondary | ICD-10-CM | POA: Diagnosis not present

## 2021-10-06 DIAGNOSIS — S2243XD Multiple fractures of ribs, bilateral, subsequent encounter for fracture with routine healing: Secondary | ICD-10-CM | POA: Diagnosis not present

## 2021-10-06 DIAGNOSIS — G4089 Other seizures: Secondary | ICD-10-CM | POA: Diagnosis not present

## 2021-10-06 DIAGNOSIS — S62327D Displaced fracture of shaft of fifth metacarpal bone, left hand, subsequent encounter for fracture with routine healing: Secondary | ICD-10-CM | POA: Diagnosis not present

## 2021-10-06 DIAGNOSIS — M4800 Spinal stenosis, site unspecified: Secondary | ICD-10-CM | POA: Diagnosis not present

## 2021-10-20 DIAGNOSIS — M25622 Stiffness of left elbow, not elsewhere classified: Secondary | ICD-10-CM | POA: Diagnosis not present

## 2021-10-20 DIAGNOSIS — M6281 Muscle weakness (generalized): Secondary | ICD-10-CM | POA: Diagnosis not present

## 2021-10-20 DIAGNOSIS — M25522 Pain in left elbow: Secondary | ICD-10-CM | POA: Diagnosis not present

## 2021-10-23 DIAGNOSIS — F32A Depression, unspecified: Secondary | ICD-10-CM | POA: Diagnosis not present

## 2021-10-23 DIAGNOSIS — M25622 Stiffness of left elbow, not elsewhere classified: Secondary | ICD-10-CM | POA: Diagnosis not present

## 2021-10-23 DIAGNOSIS — M545 Low back pain, unspecified: Secondary | ICD-10-CM | POA: Diagnosis not present

## 2021-10-23 DIAGNOSIS — H1010 Acute atopic conjunctivitis, unspecified eye: Secondary | ICD-10-CM | POA: Diagnosis not present

## 2021-10-23 DIAGNOSIS — M5416 Radiculopathy, lumbar region: Secondary | ICD-10-CM | POA: Diagnosis not present

## 2021-10-23 DIAGNOSIS — M25522 Pain in left elbow: Secondary | ICD-10-CM | POA: Diagnosis not present

## 2021-10-23 DIAGNOSIS — Z6831 Body mass index (BMI) 31.0-31.9, adult: Secondary | ICD-10-CM | POA: Diagnosis not present

## 2021-10-23 DIAGNOSIS — M6281 Muscle weakness (generalized): Secondary | ICD-10-CM | POA: Diagnosis not present

## 2021-10-25 DIAGNOSIS — M25522 Pain in left elbow: Secondary | ICD-10-CM | POA: Diagnosis not present

## 2021-10-25 DIAGNOSIS — M25622 Stiffness of left elbow, not elsewhere classified: Secondary | ICD-10-CM | POA: Diagnosis not present

## 2021-10-25 DIAGNOSIS — M6281 Muscle weakness (generalized): Secondary | ICD-10-CM | POA: Diagnosis not present

## 2021-10-30 DIAGNOSIS — M6281 Muscle weakness (generalized): Secondary | ICD-10-CM | POA: Diagnosis not present

## 2021-10-30 DIAGNOSIS — S62327A Displaced fracture of shaft of fifth metacarpal bone, left hand, initial encounter for closed fracture: Secondary | ICD-10-CM | POA: Diagnosis not present

## 2021-10-30 DIAGNOSIS — S42352D Displaced comminuted fracture of shaft of humerus, left arm, subsequent encounter for fracture with routine healing: Secondary | ICD-10-CM | POA: Diagnosis not present

## 2021-10-30 DIAGNOSIS — S62327D Displaced fracture of shaft of fifth metacarpal bone, left hand, subsequent encounter for fracture with routine healing: Secondary | ICD-10-CM | POA: Diagnosis not present

## 2021-10-30 DIAGNOSIS — M25622 Stiffness of left elbow, not elsewhere classified: Secondary | ICD-10-CM | POA: Diagnosis not present

## 2021-10-30 DIAGNOSIS — M25522 Pain in left elbow: Secondary | ICD-10-CM | POA: Diagnosis not present

## 2021-10-30 DIAGNOSIS — M19022 Primary osteoarthritis, left elbow: Secondary | ICD-10-CM | POA: Diagnosis not present

## 2021-10-30 DIAGNOSIS — M549 Dorsalgia, unspecified: Secondary | ICD-10-CM | POA: Diagnosis not present

## 2021-10-30 DIAGNOSIS — S42402B Unspecified fracture of lower end of left humerus, initial encounter for open fracture: Secondary | ICD-10-CM | POA: Diagnosis not present

## 2021-10-30 DIAGNOSIS — S42402D Unspecified fracture of lower end of left humerus, subsequent encounter for fracture with routine healing: Secondary | ICD-10-CM | POA: Diagnosis not present

## 2021-11-01 DIAGNOSIS — M25522 Pain in left elbow: Secondary | ICD-10-CM | POA: Diagnosis not present

## 2021-11-01 DIAGNOSIS — M6281 Muscle weakness (generalized): Secondary | ICD-10-CM | POA: Diagnosis not present

## 2021-11-01 DIAGNOSIS — M25622 Stiffness of left elbow, not elsewhere classified: Secondary | ICD-10-CM | POA: Diagnosis not present

## 2021-11-08 DIAGNOSIS — M25522 Pain in left elbow: Secondary | ICD-10-CM | POA: Diagnosis not present

## 2021-11-08 DIAGNOSIS — M6281 Muscle weakness (generalized): Secondary | ICD-10-CM | POA: Diagnosis not present

## 2021-11-08 DIAGNOSIS — M25622 Stiffness of left elbow, not elsewhere classified: Secondary | ICD-10-CM | POA: Diagnosis not present

## 2021-11-10 DIAGNOSIS — M6281 Muscle weakness (generalized): Secondary | ICD-10-CM | POA: Diagnosis not present

## 2021-11-10 DIAGNOSIS — M25622 Stiffness of left elbow, not elsewhere classified: Secondary | ICD-10-CM | POA: Diagnosis not present

## 2021-11-10 DIAGNOSIS — M25522 Pain in left elbow: Secondary | ICD-10-CM | POA: Diagnosis not present
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
## 2021-12-20 ENCOUNTER — Encounter: Payer: Self-pay | Admitting: Adult Health

## 2021-12-20 ENCOUNTER — Ambulatory Visit (INDEPENDENT_AMBULATORY_CARE_PROVIDER_SITE_OTHER): Payer: Medicare HMO | Admitting: Adult Health

## 2021-12-20 VITALS — BP 138/87 | HR 84 | Ht 71.0 in | Wt 225.4 lb

## 2021-12-20 DIAGNOSIS — R569 Unspecified convulsions: Secondary | ICD-10-CM

## 2021-12-20 DIAGNOSIS — G6289 Other specified polyneuropathies: Secondary | ICD-10-CM

## 2021-12-20 NOTE — Patient Instructions (Signed)
Your Plan:  Continue Keppra 1000 mg BID If your symptoms worsen or you develop new symptoms please let us know.   Thank you for coming to see [place] at [facility]. I hope we have been able to provide you high quality care today.  You may receive a patient satisfaction survey over the next few weeks. We would appreciate your feedback and comments so that we may continue to improve ourselves and the health of our patients.

## 2022-01-04 ENCOUNTER — Other Ambulatory Visit: Payer: Self-pay | Admitting: Adult Health

## 2022-06-22 ENCOUNTER — Other Ambulatory Visit: Payer: Self-pay | Admitting: Neurological Surgery

## 2022-06-26 ENCOUNTER — Other Ambulatory Visit: Payer: Self-pay | Admitting: Neurological Surgery

## 2022-06-26 DIAGNOSIS — S22009D Unspecified fracture of unspecified thoracic vertebra, subsequent encounter for fracture with routine healing: Secondary | ICD-10-CM

## 2022-07-04 ENCOUNTER — Ambulatory Visit
Admission: RE | Admit: 2022-07-04 | Discharge: 2022-07-04 | Disposition: A | Discharge status: Home / Self Care | Payer: Medicare HMO | Source: Ambulatory Visit | Attending: Neurological Surgery | Admitting: Neurological Surgery

## 2022-07-04 DIAGNOSIS — S22009D Unspecified fracture of unspecified thoracic vertebra, subsequent encounter for fracture with routine healing: Secondary | ICD-10-CM

## 2022-07-23 ENCOUNTER — Other Ambulatory Visit: Payer: Self-pay | Admitting: Neurological Surgery

## 2022-07-23 ENCOUNTER — Other Ambulatory Visit (HOSPITAL_COMMUNITY): Payer: Self-pay | Admitting: Neurological Surgery

## 2022-07-23 DIAGNOSIS — S22009D Unspecified fracture of unspecified thoracic vertebra, subsequent encounter for fracture with routine healing: Secondary | ICD-10-CM

## 2022-08-01 ENCOUNTER — Ambulatory Visit (HOSPITAL_COMMUNITY)
Admission: RE | Admit: 2022-08-01 | Discharge: 2022-08-01 | Disposition: A | Discharge status: Home / Self Care | Payer: Medicare HMO | Source: Ambulatory Visit | Attending: Neurological Surgery | Admitting: Neurological Surgery

## 2022-08-01 DIAGNOSIS — S22009D Unspecified fracture of unspecified thoracic vertebra, subsequent encounter for fracture with routine healing: Secondary | ICD-10-CM

## 2022-08-09 ENCOUNTER — Other Ambulatory Visit: Payer: Self-pay | Admitting: Neurological Surgery

## 2022-08-21 NOTE — Pre-Procedure Instructions (Signed)
Surgical Instructions    Your procedure is scheduled on Tuesday, October 17.  Report to [facility] Main Entrance "A" at 5:30 A.M., then check in with the Admitting office.  Call this number if you have problems the morning of surgery:  [phone]   If you have any questions prior to your surgery date call [phone]: Open Monday-Friday 8am-4pm If you experience any cold or flu symptoms such as cough, fever, chills, shortness of breath, etc. between now and your scheduled surgery, please notify us at the above number     Remember:  Do not eat after midnight the night before your surgery  You may drink clear liquids until 4:30AM the morning of your surgery.   Clear liquids allowed are: Water, Non-Citrus Juices (without pulp), Carbonated Beverages, Clear Tea, Black Coffee ONLY (NO MILK, CREAM OR POWDERED CREAMER of any kind), and Gatorade    Take these medicines the morning of surgery with A SIP OF WATER:  levETIRAcetam (KEPPRA)  acetaminophen (TYLENOL) if needed   As of today, STOP taking any Aspirin (unless otherwise instructed by your surgeon) celecoxib (CELEBREX) , Aleve, Naproxen, Ibuprofen, Motrin, Advil, Goody's, BC's, all herbal medications, fish oil, and all vitamins.   [facility] is not responsible for any belongings or valuables.    Do NOT Smoke (Tobacco/Vaping)  24 hours prior to your procedure  If you use a CPAP at night, you may bring your mask for your overnight stay.   Contacts, glasses, hearing aids, dentures or partials may not be worn into surgery, please bring cases for these belongings   For patients admitted to the hospital, discharge time will be determined by your treatment team.   Patients discharged the day of surgery will not be allowed to drive home, and someone needs to stay with them for 24 hours.   SURGICAL WAITING ROOM VISITATION Patients having surgery or a procedure may have no more than 2 support people in the waiting area - these visitors  may rotate.   Children under the age of 50 must have an adult with them who is not the patient. If the patient needs to stay at the hospital during part of their recovery, the visitor guidelines for inpatient rooms apply. Pre-op nurse will coordinate an appropriate time for 1 support person to accompany patient in pre-op.  This support person may not rotate.   Please refer to the [facility] website for the visitor guidelines for Inpatients (after your surgery is over and you are in a regular room).    Special instructions:    Oral Hygiene is also important to reduce your risk of infection.  Remember - BRUSH YOUR TEETH THE MORNING OF SURGERY WITH YOUR REGULAR TOOTHPASTE   [place]- Preparing For Surgery  Before surgery, you can play an important role. Because skin is not sterile, your skin needs to be as free of germs as possible. You can reduce the number of germs on your skin by washing with CHG (chlorahexidine gluconate) Soap before surgery.  CHG is an antiseptic cleaner which kills germs and bonds with the skin to continue killing germs even after washing.     Please do not use if you have an allergy to CHG or antibacterial soaps. If your skin becomes reddened/irritated stop using the CHG.  Do not shave (including legs and underarms) for at least 48 hours prior to first CHG shower. It is OK to shave your face.  Please follow these instructions carefully.     Shower the  NIGHT BEFORE SURGERY and the MORNING OF SURGERY with CHG Soap.   If you chose to wash your hair, wash your hair first as usual with your normal shampoo. After you shampoo, rinse your hair and body thoroughly to remove the shampoo.  Then [employer] and genitals (private parts) with your normal soap and rinse thoroughly to remove soap.  After that Use CHG Soap as you would any other liquid soap. You can apply CHG directly to the skin and wash gently with a scrungie or a clean washcloth.   Apply the CHG Soap to your body  ONLY FROM THE NECK DOWN.  Do not use on open wounds or open sores. Avoid contact with your eyes, ears, mouth and genitals (private parts). Wash Face and genitals (private parts)  with your normal soap.   Wash thoroughly, paying special attention to the area where your surgery will be performed.  Thoroughly rinse your body with warm water from the neck down.  DO NOT shower/wash with your normal soap after using and rinsing off the CHG Soap.  Pat yourself dry with a CLEAN TOWEL.  Wear CLEAN PAJAMAS to bed the night before surgery  Place CLEAN SHEETS on your bed the night before your surgery  DO NOT SLEEP WITH PETS.   Day of Surgery:  Take a shower with CHG soap. Wear Clean/Comfortable clothing the morning of surgery Do not wear jewelry or makeup. Do not wear lotions, powders, perfumes/cologne or deodorant. Do not shave 48 hours prior to surgery.  Men may shave face and neck. Do not bring valuables to the hospital. Do not wear nail polish, gel polish, artificial nails, or any other type of covering on natural nails (fingers and toes) If you have artificial nails or gel coating that need to be removed by a nail salon, please have this removed prior to surgery. Artificial nails or gel coating may interfere with anesthesia's ability to adequately monitor your vital signs.  Remember to brush your teeth WITH YOUR REGULAR TOOTHPASTE.    If you received a COVID test during your pre-op visit, it is requested that you wear a mask when out in public, stay away from anyone that may not be feeling well, and notify your surgeon if you develop symptoms. If you have been in contact with anyone that has tested positive in the last 10 days, please notify your surgeon.    Please read over the following fact sheets that you were given.

## 2022-08-22 ENCOUNTER — Encounter (HOSPITAL_COMMUNITY)
Admission: RE | Admit: 2022-08-22 | Discharge: 2022-08-22 | Disposition: A | Discharge status: Home / Self Care | Payer: Medicare HMO | Source: Ambulatory Visit | Attending: Neurological Surgery | Admitting: Neurological Surgery

## 2022-08-22 ENCOUNTER — Other Ambulatory Visit: Payer: Self-pay

## 2022-08-22 ENCOUNTER — Encounter (HOSPITAL_COMMUNITY): Payer: Self-pay

## 2022-08-22 VITALS — BP 138/93 | HR 76 | Temp 98.3°F | Resp 17 | Ht 69.0 in | Wt 228.3 lb

## 2022-08-22 DIAGNOSIS — Z01818 Encounter for other preprocedural examination: Secondary | ICD-10-CM | POA: Diagnosis not present

## 2022-08-22 DIAGNOSIS — G988 Other disorders of nervous system: Secondary | ICD-10-CM

## 2022-08-22 HISTORY — DX: Personal history of urinary calculi: Z87.442

## 2022-08-22 LAB — BASIC METABOLIC PANEL
Anion gap: 5 (ref 5–15)
BUN: 14 mg/dL (ref 8–23)
CO2: 29 mmol/L (ref 22–32)
Calcium: 8.9 mg/dL (ref 8.9–10.3)
Chloride: 107 mmol/L (ref 98–111)
Creatinine, Ser: 1.19 mg/dL (ref 0.61–1.24)
GFR, Estimated: >60 mL/min (ref >60)
Glucose, Bld: 118 mg/dL — ABNORMAL HIGH (ref 70–99)
Potassium: 4.3 mmol/L (ref 3.5–5.1)
Sodium: 141 mmol/L (ref 135–145)

## 2022-08-22 LAB — CBC
HCT: 42.6 % (ref 39.0–52.0)
Hemoglobin: 14.9 g/dL (ref 13.0–17.0)
MCH: 31 pg (ref 26.0–34.0)
MCHC: 35 g/dL (ref 30.0–36.0)
MCV: 88.8 fL (ref 80.0–100.0)
Platelets: 196 10*3/uL (ref 150–400)
RBC: 4.8 MIL/uL (ref 4.22–5.81)
RDW: 12.7 % (ref 11.5–15.5)
WBC: 7.5 10*3/uL (ref 4.0–10.5)
nRBC: 0 % (ref 0.0–0.2)

## 2022-08-22 LAB — SURGICAL PCR SCREEN
MRSA, PCR: POSITIVE — AB
Staphylococcus aureus: POSITIVE — AB

## 2022-08-22 LAB — TYPE AND SCREEN
ABO/RH(D): A POS
Antibody Screen: NEGATIVE

## 2022-08-22 NOTE — Progress Notes (Signed)
PCP - Fae Pippin Cardiologist - denies Neurologist-Megan Delsa Sale, NP  PPM/ICD - denies Device Orders - n/a Rep Notified - n/a  Chest x-ray - n/a EKG - denies Stress Test - denies ECHO - denies Cardiac Cath - denies  Sleep Study - pt reports he had a sleep study many years ago used a mask but denies sleep apnea at this time.  CPAP - denies  Pt denies DM  Blood Thinner Instructions: n/a Aspirin Instructions:n/a  ERAS Protcol -yes  PRE-SURGERY Ensure or G2- none ordered   COVID TEST- n/a  Last Seizure patient reported was in 2020. Pt will continue to take Seizure medication.   Anesthesia review: NO  Patient denies shortness of breath, fever, cough and chest pain at PAT appointment   All instructions explained to the patient, with a verbal understanding of the material. Patient agrees to go over the instructions while at home for a better understanding. Patient also instructed to self quarantine after being tested for COVID-19. The opportunity to ask questions was provided.

## 2022-08-22 NOTE — Progress Notes (Signed)
Consent order does not state "Fusion", but posting states "Fusion". Surgical posting states "10-pelvis", but does not state "thoracic 10". Message left with Dr. Colleen Can office to clarify.

## 2022-08-22 NOTE — Progress Notes (Signed)
Nasal PCR positive for MRSA. Message left for Dr. Colleen Can surgery scheduler to notify of result.

## 2022-08-28 ENCOUNTER — Encounter (HOSPITAL_COMMUNITY)
Admission: RE | Disposition: A | Discharge status: Home / Self Care | Payer: Self-pay | Source: Home / Self Care | Attending: Neurological Surgery

## 2022-08-28 ENCOUNTER — Other Ambulatory Visit: Payer: Self-pay

## 2022-08-28 ENCOUNTER — Inpatient Hospital Stay (HOSPITAL_COMMUNITY): Payer: Medicare HMO

## 2022-08-28 ENCOUNTER — Encounter (HOSPITAL_COMMUNITY): Payer: Self-pay | Admitting: Neurological Surgery

## 2022-08-28 ENCOUNTER — Inpatient Hospital Stay (HOSPITAL_COMMUNITY): Payer: Medicare HMO | Admitting: Anesthesiology

## 2022-08-28 ENCOUNTER — Inpatient Hospital Stay (HOSPITAL_COMMUNITY)
Admission: RE | Admit: 2022-08-28 | Discharge: 2022-08-29 | DRG: 516 | Disposition: A | Discharge status: Home / Self Care | Payer: Medicare HMO | Attending: Neurological Surgery | Admitting: Neurological Surgery

## 2022-08-28 DIAGNOSIS — Z8546 Personal history of malignant neoplasm of prostate: Secondary | ICD-10-CM | POA: Diagnosis not present

## 2022-08-28 DIAGNOSIS — S32009A Unspecified fracture of unspecified lumbar vertebra, initial encounter for closed fracture: Principal | ICD-10-CM | POA: Diagnosis present

## 2022-08-28 DIAGNOSIS — Y838 Other surgical procedures as the cause of abnormal reaction of the patient, or of later complication, without mention of misadventure at the time of the procedure: Secondary | ICD-10-CM | POA: Diagnosis present

## 2022-08-28 DIAGNOSIS — E7800 Pure hypercholesterolemia, unspecified: Secondary | ICD-10-CM | POA: Diagnosis present

## 2022-08-28 DIAGNOSIS — M960 Pseudarthrosis after fusion or arthrodesis: Secondary | ICD-10-CM | POA: Diagnosis present

## 2022-08-28 DIAGNOSIS — F418 Other specified anxiety disorders: Secondary | ICD-10-CM

## 2022-08-28 DIAGNOSIS — T84038A Mechanical loosening of other internal prosthetic joint, initial encounter: Principal | ICD-10-CM | POA: Diagnosis present

## 2022-08-28 DIAGNOSIS — Y793 Surgical instruments, materials and orthopedic devices (including sutures) associated with adverse incidents: Secondary | ICD-10-CM | POA: Diagnosis present

## 2022-08-28 DIAGNOSIS — S32031A Stable burst fracture of third lumbar vertebra, initial encounter for closed fracture: Secondary | ICD-10-CM | POA: Diagnosis present

## 2022-08-28 DIAGNOSIS — M4816 Ankylosing hyperostosis [Forestier], lumbar region: Secondary | ICD-10-CM | POA: Diagnosis present

## 2022-08-28 DIAGNOSIS — G4730 Sleep apnea, unspecified: Secondary | ICD-10-CM

## 2022-08-28 DIAGNOSIS — M1990 Unspecified osteoarthritis, unspecified site: Secondary | ICD-10-CM | POA: Diagnosis not present

## 2022-08-28 DIAGNOSIS — Z8042 Family history of malignant neoplasm of prostate: Secondary | ICD-10-CM

## 2022-08-28 DIAGNOSIS — S32019A Unspecified fracture of first lumbar vertebra, initial encounter for closed fracture: Secondary | ICD-10-CM

## 2022-08-28 SURGERY — POSTERIOR LUMBAR FUSION 1 LEVEL
Anesthesia: General

## 2022-08-28 MED ORDER — LIDOCAINE 2% (20 MG/ML) 5 ML SYRINGE
INTRAMUSCULAR | Status: AC
  Filled 2022-08-28: qty 5

## 2022-08-28 MED ORDER — ACETAMINOPHEN 500 MG PO TABS: 1000.0000 mg | ORAL_TABLET | Freq: Once | ORAL | Status: DC | PRN

## 2022-08-28 MED ORDER — PROPOFOL 10 MG/ML IV BOLUS
INTRAVENOUS | Status: DC | PRN
  Administered 2022-08-28: 30 mg via INTRAVENOUS
  Administered 2022-08-28: 150 mg via INTRAVENOUS

## 2022-08-28 MED ORDER — ROCURONIUM BROMIDE 100 MG/10ML IV SOLN
INTRAVENOUS | Status: DC | PRN
  Administered 2022-08-28 (×2): 20 mg via INTRAVENOUS
  Administered 2022-08-28: 30 mg via INTRAVENOUS
  Administered 2022-08-28: 70 mg via INTRAVENOUS

## 2022-08-28 MED ORDER — PHENYLEPHRINE HCL-NACL 20-0.9 MG/250ML-% IV SOLN
INTRAVENOUS | Status: DC | PRN
  Administered 2022-08-28: 50 ug/min via INTRAVENOUS

## 2022-08-28 MED ORDER — ONDANSETRON HCL 4 MG/2ML IJ SOLN
INTRAMUSCULAR | Status: DC | PRN
  Administered 2022-08-28: 4 mg via INTRAVENOUS

## 2022-08-28 MED ORDER — BUPIVACAINE HCL (PF) 0.25 % IJ SOLN
INTRAMUSCULAR | Status: AC
  Filled 2022-08-28: qty 30

## 2022-08-28 MED ORDER — FENTANYL CITRATE (PF) 250 MCG/5ML IJ SOLN
INTRAMUSCULAR | Status: DC | PRN
  Administered 2022-08-28 (×3): 50 ug via INTRAVENOUS
  Administered 2022-08-28: 150 ug via INTRAVENOUS
  Administered 2022-08-28: 50 ug via INTRAVENOUS

## 2022-08-28 MED ORDER — CHLORHEXIDINE GLUCONATE 0.12 % MT SOLN
15.0000 mL | Freq: Once | OROMUCOSAL | Status: AC
  Administered 2022-08-28: 15 mL via OROMUCOSAL
  Filled 2022-08-28: qty 15

## 2022-08-28 MED ORDER — DEXAMETHASONE SODIUM PHOSPHATE 4 MG/ML IJ SOLN
INTRAMUSCULAR | Status: DC | PRN
  Administered 2022-08-28: 6 mg via INTRAVENOUS

## 2022-08-28 MED ORDER — CYCLOBENZAPRINE HCL 10 MG PO TABS
10.0000 mg | ORAL_TABLET | Freq: Three times a day (TID) | ORAL | Status: DC | PRN
  Administered 2022-08-28: 10 mg via ORAL
  Filled 2022-08-28: qty 1

## 2022-08-28 MED ORDER — TRIAMCINOLONE ACETONIDE 0.1 % EX CREA: 1.0000 | TOPICAL_CREAM | Freq: Three times a day (TID) | CUTANEOUS | Status: DC | PRN

## 2022-08-28 MED ORDER — ORAL CARE MOUTH RINSE: 15.0000 mL | Freq: Once | OROMUCOSAL | Status: AC

## 2022-08-28 MED ORDER — MIDAZOLAM HCL 5 MG/5ML IJ SOLN
INTRAMUSCULAR | Status: DC | PRN
  Administered 2022-08-28: 2 mg via INTRAVENOUS

## 2022-08-28 MED ORDER — ACETAMINOPHEN 325 MG PO TABS
650.0000 mg | ORAL_TABLET | ORAL | Status: DC | PRN
  Administered 2022-08-28: 650 mg via ORAL
  Filled 2022-08-28: qty 2

## 2022-08-28 MED ORDER — PHENOL 1.4 % MT LIQD: 1.0000 | OROMUCOSAL | Status: DC | PRN

## 2022-08-28 MED ORDER — HYDROMORPHONE HCL 1 MG/ML IJ SOLN: 1.0000 mg | INTRAMUSCULAR | Status: DC | PRN

## 2022-08-28 MED ORDER — ONDANSETRON HCL 4 MG/2ML IJ SOLN
INTRAMUSCULAR | Status: AC
  Filled 2022-08-28: qty 2

## 2022-08-28 MED ORDER — ACETAMINOPHEN 10 MG/ML IV SOLN
1000.0000 mg | Freq: Once | INTRAVENOUS | Status: DC | PRN
  Administered 2022-08-28: 1000 mg via INTRAVENOUS

## 2022-08-28 MED ORDER — HYDROCODONE-ACETAMINOPHEN 5-325 MG PO TABS
1.0000 | ORAL_TABLET | ORAL | Status: DC | PRN
  Administered 2022-08-28 – 2022-08-29 (×3): 1 via ORAL
  Administered 2022-08-29: 2 via ORAL
  Filled 2022-08-28: qty 1
  Filled 2022-08-28: qty 2
  Filled 2022-08-28: qty 1
  Filled 2022-08-28 (×2): qty 2
  Filled 2022-08-28: qty 1

## 2022-08-28 MED ORDER — APOAEQUORIN 10 MG PO CAPS: ORAL_CAPSULE | Freq: Every day | ORAL | Status: DC

## 2022-08-28 MED ORDER — THROMBIN 5000 UNITS EX SOLR
CUTANEOUS | Status: AC
  Filled 2022-08-28: qty 5000

## 2022-08-28 MED ORDER — FENTANYL CITRATE (PF) 100 MCG/2ML IJ SOLN
INTRAMUSCULAR | Status: AC
  Filled 2022-08-28: qty 2

## 2022-08-28 MED ORDER — CHLORHEXIDINE GLUCONATE CLOTH 2 % EX PADS
6.0000 | MEDICATED_PAD | Freq: Every day | CUTANEOUS | Status: DC
  Administered 2022-08-29: 6 via TOPICAL

## 2022-08-28 MED ORDER — SUGAMMADEX SODIUM 200 MG/2ML IV SOLN
INTRAVENOUS | Status: DC | PRN
  Administered 2022-08-28: 200 mg via INTRAVENOUS

## 2022-08-28 MED ORDER — FENTANYL CITRATE (PF) 250 MCG/5ML IJ SOLN
INTRAMUSCULAR | Status: AC
  Filled 2022-08-28: qty 5

## 2022-08-28 MED ORDER — ACETAMINOPHEN 650 MG RE SUPP: 650.0000 mg | RECTAL | Status: DC | PRN

## 2022-08-28 MED ORDER — HYDROCODONE-ACETAMINOPHEN 7.5-325 MG PO TABS: 1.0000 | ORAL_TABLET | Freq: Once | ORAL | Status: DC | PRN

## 2022-08-28 MED ORDER — LIDOCAINE-EPINEPHRINE 1 %-1:100000 IJ SOLN
INTRAMUSCULAR | Status: DC | PRN
  Administered 2022-08-28: 5 mL

## 2022-08-28 MED ORDER — ACETAMINOPHEN 160 MG/5ML PO SOLN: 1000.0000 mg | Freq: Once | ORAL | Status: DC | PRN

## 2022-08-28 MED ORDER — CHLORHEXIDINE GLUCONATE CLOTH 2 % EX PADS: 6.0000 | MEDICATED_PAD | Freq: Once | CUTANEOUS | Status: DC

## 2022-08-28 MED ORDER — ACETAMINOPHEN 10 MG/ML IV SOLN
INTRAVENOUS | Status: AC
  Filled 2022-08-28: qty 100

## 2022-08-28 MED ORDER — CEFAZOLIN SODIUM-DEXTROSE 2-4 GM/100ML-% IV SOLN
2.0000 g | Freq: Three times a day (TID) | INTRAVENOUS | Status: AC
  Administered 2022-08-28 (×2): 2 g via INTRAVENOUS
  Filled 2022-08-28 (×2): qty 100

## 2022-08-28 MED ORDER — MIDAZOLAM HCL 2 MG/2ML IJ SOLN
INTRAMUSCULAR | Status: AC
  Filled 2022-08-28: qty 2

## 2022-08-28 MED ORDER — PROPOFOL 10 MG/ML IV BOLUS
INTRAVENOUS | Status: AC
  Filled 2022-08-28: qty 20

## 2022-08-28 MED ORDER — DEXAMETHASONE SODIUM PHOSPHATE 10 MG/ML IJ SOLN
INTRAMUSCULAR | Status: AC
  Filled 2022-08-28: qty 1

## 2022-08-28 MED ORDER — CEFAZOLIN SODIUM-DEXTROSE 2-4 GM/100ML-% IV SOLN
2.0000 g | INTRAVENOUS | Status: AC
  Administered 2022-08-28: 2 g via INTRAVENOUS
  Filled 2022-08-28: qty 100

## 2022-08-28 MED ORDER — SODIUM CHLORIDE 0.9% FLUSH: 3.0000 mL | INTRAVENOUS | Status: DC | PRN

## 2022-08-28 MED ORDER — LIDOCAINE-EPINEPHRINE 1 %-1:100000 IJ SOLN
INTRAMUSCULAR | Status: AC
  Filled 2022-08-28: qty 1

## 2022-08-28 MED ORDER — SODIUM CHLORIDE 0.9% FLUSH
3.0000 mL | Freq: Two times a day (BID) | INTRAVENOUS | Status: DC
  Administered 2022-08-28: 3 mL via INTRAVENOUS

## 2022-08-28 MED ORDER — LIDOCAINE 2% (20 MG/ML) 5 ML SYRINGE
INTRAMUSCULAR | Status: DC | PRN
  Administered 2022-08-28: 60 mg via INTRAVENOUS

## 2022-08-28 MED ORDER — ONDANSETRON HCL 4 MG/2ML IJ SOLN: 4.0000 mg | Freq: Four times a day (QID) | INTRAMUSCULAR | Status: DC | PRN

## 2022-08-28 MED ORDER — MUPIROCIN 2 % EX OINT
1.0000 | TOPICAL_OINTMENT | Freq: Two times a day (BID) | CUTANEOUS | Status: DC
  Administered 2022-08-28 – 2022-08-29 (×2): 1 via NASAL
  Filled 2022-08-28: qty 22

## 2022-08-28 MED ORDER — SODIUM CHLORIDE 0.9 % IV SOLN
250.0000 mL | INTRAVENOUS | Status: DC
  Administered 2022-08-28: 250 mL via INTRAVENOUS

## 2022-08-28 MED ORDER — DOCUSATE SODIUM 100 MG PO CAPS
100.0000 mg | ORAL_CAPSULE | Freq: Two times a day (BID) | ORAL | Status: DC
  Administered 2022-08-28 – 2022-08-29 (×3): 100 mg via ORAL
  Filled 2022-08-28 (×3): qty 1

## 2022-08-28 MED ORDER — 0.9 % SODIUM CHLORIDE (POUR BTL) OPTIME
TOPICAL | Status: DC | PRN
  Administered 2022-08-28: 1000 mL

## 2022-08-28 MED ORDER — ONDANSETRON HCL 4 MG PO TABS: 4.0000 mg | ORAL_TABLET | Freq: Four times a day (QID) | ORAL | Status: DC | PRN

## 2022-08-28 MED ORDER — FENTANYL CITRATE (PF) 100 MCG/2ML IJ SOLN
25.0000 ug | INTRAMUSCULAR | Status: DC | PRN
  Administered 2022-08-28: 25 ug via INTRAVENOUS
  Administered 2022-08-28: 50 ug via INTRAVENOUS
  Administered 2022-08-28: 25 ug via INTRAVENOUS
  Administered 2022-08-28: 50 ug via INTRAVENOUS

## 2022-08-28 MED ORDER — PHENYLEPHRINE 80 MCG/ML (10ML) SYRINGE FOR IV PUSH (FOR BLOOD PRESSURE SUPPORT)
PREFILLED_SYRINGE | INTRAVENOUS | Status: AC
  Filled 2022-08-28: qty 10

## 2022-08-28 MED ORDER — MENTHOL 3 MG MT LOZG: 1.0000 | LOZENGE | OROMUCOSAL | Status: DC | PRN

## 2022-08-28 MED ORDER — BUPIVACAINE HCL (PF) 0.5 % IJ SOLN
INTRAMUSCULAR | Status: DC | PRN
  Administered 2022-08-28: 5 mL

## 2022-08-28 MED ORDER — POLYETHYLENE GLYCOL 3350 17 G PO PACK: 17.0000 g | PACK | Freq: Every day | ORAL | Status: DC | PRN

## 2022-08-28 MED ORDER — LACTATED RINGERS IV SOLN: INTRAVENOUS | Status: DC

## 2022-08-28 MED ORDER — PHENYLEPHRINE HCL (PRESSORS) 10 MG/ML IV SOLN: INTRAVENOUS | Status: DC | PRN

## 2022-08-28 MED ORDER — LEVETIRACETAM 500 MG PO TABS
1000.0000 mg | ORAL_TABLET | Freq: Two times a day (BID) | ORAL | Status: DC
  Administered 2022-08-28 – 2022-08-29 (×2): 1000 mg via ORAL
  Filled 2022-08-28 (×4): qty 2

## 2022-08-28 MED ORDER — TAMSULOSIN HCL 0.4 MG PO CAPS
0.4000 mg | ORAL_CAPSULE | Freq: Every day | ORAL | Status: DC
  Administered 2022-08-28 – 2022-08-29 (×2): 0.4 mg via ORAL
  Filled 2022-08-28 (×2): qty 1

## 2022-08-28 MED ORDER — PHENYLEPHRINE 80 MCG/ML (10ML) SYRINGE FOR IV PUSH (FOR BLOOD PRESSURE SUPPORT)
PREFILLED_SYRINGE | INTRAVENOUS | Status: DC | PRN
  Administered 2022-08-28 (×2): 80 ug via INTRAVENOUS

## 2022-08-28 MED ORDER — THROMBIN 5000 UNITS EX SOLR
OROMUCOSAL | Status: DC | PRN
  Administered 2022-08-28: 5 mL via TOPICAL

## 2022-08-28 MED ORDER — ROCURONIUM BROMIDE 10 MG/ML (PF) SYRINGE
PREFILLED_SYRINGE | INTRAVENOUS | Status: AC
  Filled 2022-08-28: qty 10

## 2022-08-28 SURGICAL SUPPLY — 83 items
ADH SKN CLS APL DERMABOND .7 (GAUZE/BANDAGES/DRESSINGS) ×1
APL SKNCLS STERI-STRIP NONHPOA (GAUZE/BANDAGES/DRESSINGS)
BAG COUNTER SPONGE SURGICOUNT (BAG) ×1 IMPLANT
BAG SPNG CNTER NS LX DISP (BAG) ×1
BASKET BONE COLLECTION (BASKET) ×1 IMPLANT
BENZOIN TINCTURE PRP APPL 2/3 (GAUZE/BANDAGES/DRESSINGS) IMPLANT
BLADE CLIPPER SURG (BLADE) IMPLANT
BLADE SURG 11 STRL SS (BLADE) ×1 IMPLANT
BUR 14 MATCH 3 (BUR) IMPLANT
BUR MATCHSTICK NEURO 3.0 LAGG (BURR) ×1 IMPLANT
BUR MR8 14CM BALL SYMTRI 5 (BUR) IMPLANT
BUR PRECISION FLUTE 5.0 (BURR) ×1 IMPLANT
BURR 14 MATCH 3 (BUR) ×2
BURR MR8 14CM BALL SYMTRI 5 (BUR) ×1
CANISTER SUCT 3000ML PPV (MISCELLANEOUS) ×1 IMPLANT
CNTNR URN SCR LID CUP LEK RST (MISCELLANEOUS) ×1 IMPLANT
CONT SPEC 4OZ STRL OR WHT (MISCELLANEOUS) ×1
COVER BACK TABLE 60X90IN (DRAPES) ×1 IMPLANT
COVERAGE SUPPORT O-ARM STEALTH (MISCELLANEOUS) ×1 IMPLANT
DERMABOND ADVANCED .7 DNX12 (GAUZE/BANDAGES/DRESSINGS) ×1 IMPLANT
DRAPE C-ARM 42X72 X-RAY (DRAPES) IMPLANT
DRAPE C-ARMOR (DRAPES) IMPLANT
DRAPE LAPAROTOMY 100X72X124 (DRAPES) ×1 IMPLANT
DRAPE SHEET LG 3/4 BI-LAMINATE (DRAPES) ×4 IMPLANT
DRAPE SURG 17X23 STRL (DRAPES) ×1 IMPLANT
DURAPREP 26ML APPLICATOR (WOUND CARE) ×1 IMPLANT
ELECT REM PT RETURN 9FT ADLT (ELECTROSURGICAL) ×1
ELECTRODE REM PT RTRN 9FT ADLT (ELECTROSURGICAL) ×1 IMPLANT
FEE COVERAGE SUPPORT O-ARM (MISCELLANEOUS) ×1 IMPLANT
GAUZE 4X4 16PLY ~~LOC~~+RFID DBL (SPONGE) IMPLANT
GAUZE SPONGE 4X4 12PLY STRL (GAUZE/BANDAGES/DRESSINGS) IMPLANT
GLOVE BIOGEL PI IND STRL 7.5 (GLOVE) ×2 IMPLANT
GLOVE BIOGEL PI IND STRL 8 (GLOVE) IMPLANT
GLOVE ECLIPSE 7.0 STRL STRAW (GLOVE) IMPLANT
GLOVE ECLIPSE 7.5 STRL STRAW (GLOVE) ×2 IMPLANT
GLOVE ECLIPSE 8.0 STRL XLNG CF (GLOVE) IMPLANT
GLOVE EXAM NITRILE LRG STRL (GLOVE) IMPLANT
GLOVE EXAM NITRILE XL STR (GLOVE) IMPLANT
GLOVE EXAM NITRILE XS STR PU (GLOVE) IMPLANT
GOWN STRL REUS W/ TWL LRG LVL3 (GOWN DISPOSABLE) ×4 IMPLANT
GOWN STRL REUS W/ TWL XL LVL3 (GOWN DISPOSABLE) IMPLANT
GOWN STRL REUS W/TWL 2XL LVL3 (GOWN DISPOSABLE) IMPLANT
GOWN STRL REUS W/TWL LRG LVL3 (GOWN DISPOSABLE) ×4
GOWN STRL REUS W/TWL XL LVL3 (GOWN DISPOSABLE)
GRAFT BN 5X1XSPNE CVD POST DBM (Bone Implant) IMPLANT
GRAFT BONE MAGNIFUSE 1X5CM (Bone Implant) ×2 IMPLANT
HEMOSTAT POWDER KIT SURGIFOAM (HEMOSTASIS) ×1 IMPLANT
KIT BASIN OR (CUSTOM PROCEDURE TRAY) ×1 IMPLANT
KIT POSITION SURG JACKSON T1 (MISCELLANEOUS) ×1 IMPLANT
KIT TURNOVER KIT B (KITS) ×1 IMPLANT
MARKER SPHERE PSV REFLC NDI (MISCELLANEOUS) ×5 IMPLANT
MILL BONE PREP (MISCELLANEOUS) ×1 IMPLANT
NDL HYPO 18GX1.5 BLUNT FILL (NEEDLE) IMPLANT
NDL SPNL 18GX3.5 QUINCKE PK (NEEDLE) IMPLANT
NEEDLE HYPO 18GX1.5 BLUNT FILL (NEEDLE) IMPLANT
NEEDLE HYPO 22GX1.5 SAFETY (NEEDLE) ×1 IMPLANT
NEEDLE SPNL 18GX3.5 QUINCKE PK (NEEDLE) IMPLANT
NS IRRIG 1000ML POUR BTL (IV SOLUTION) ×1 IMPLANT
PACK LAMINECTOMY NEURO (CUSTOM PROCEDURE TRAY) ×1 IMPLANT
PAD ARMBOARD 7.5X6 YLW CONV (MISCELLANEOUS) ×3 IMPLANT
RASP 3.0MM (RASP) IMPLANT
ROD 5.5MM SPINAL SOLERA (Rod) IMPLANT
SCREW BS CNCMA S 8.5X80 T/C (Screw) IMPLANT
SCREW POST SOLERA 6.5X55 F/5.5 (Screw) IMPLANT
SCREW SET SOLERA (Screw) ×10 IMPLANT
SCREW SET SOLERA TI5.5 (Screw) IMPLANT
SCREW SOLERA 45X6.5XMA NS SPNE (Screw) IMPLANT
SCREW SOLERA 6.5X45MM (Screw) ×2 IMPLANT
SCREW SOLERA 6.5X50 (Screw) IMPLANT
SPIKE FLUID TRANSFER (MISCELLANEOUS) ×1 IMPLANT
SPONGE SURGIFOAM ABS GEL 100 (HEMOSTASIS) IMPLANT
SPONGE T-LAP 4X18 ~~LOC~~+RFID (SPONGE) IMPLANT
STRIP CLOSURE SKIN 1/2X4 (GAUZE/BANDAGES/DRESSINGS) IMPLANT
SURGILUBE 2OZ TUBE FLIPTOP (MISCELLANEOUS) IMPLANT
SUT MNCRL AB 3-0 PS2 18 (SUTURE) ×1 IMPLANT
SUT VIC AB 0 CT1 18XCR BRD8 (SUTURE) ×1 IMPLANT
SUT VIC AB 0 CT1 8-18 (SUTURE) ×3
SUT VIC AB 2-0 CP2 18 (SUTURE) ×1 IMPLANT
SYR 3ML LL SCALE MARK (SYRINGE) IMPLANT
TOWEL GREEN STERILE (TOWEL DISPOSABLE) ×1 IMPLANT
TOWEL GREEN STERILE FF (TOWEL DISPOSABLE) ×1 IMPLANT
TRAY FOLEY MTR SLVR 16FR STAT (SET/KITS/TRAYS/PACK) ×1 IMPLANT
WATER STERILE IRR 1000ML POUR (IV SOLUTION) ×1 IMPLANT

## 2022-08-28 NOTE — Anesthesia Preprocedure Evaluation (Signed)
Anesthesia Evaluation  Patient identified by MRN, date of birth, ID band Patient awake    Reviewed: Allergy & Precautions, NPO status , Patient's Chart, lab work & pertinent test results  History of Anesthesia Complications Negative for: history of anesthetic complications  Airway Mallampati: III  TM Distance: >3 FB Neck ROM: Full    Dental  (+) Dental Advisory Given, Teeth Intact   Pulmonary neg shortness of breath, sleep apnea , neg COPD, neg recent URI   breath sounds clear to auscultation       Cardiovascular negative cardio ROS  Rhythm:Regular     Neuro/Psych  Headaches, Seizures -, Well Controlled,  PSYCHIATRIC DISORDERS Anxiety Depression     Neuromuscular disease    GI/Hepatic negative GI ROS, Neg liver ROS,,,  Endo/Other  diabetes  Lab Results      Component                Value               Date                      HGBA1C                   6.7 (H)             01/12/2020             Renal/GU negative Renal ROSLab Results      Component                Value               Date                      CREATININE               1.19                08/22/2022                Musculoskeletal  (+) Arthritis ,    Abdominal   Peds  Hematology negative hematology ROS (+)   Anesthesia Other Findings Prostate ca  Reproductive/Obstetrics                             Anesthesia Physical Anesthesia Plan  ASA: 3  Anesthesia Plan: General   Post-op Pain Management: Ofirmev IV (intra-op)* and Toradol IV (intra-op)*   Induction: Intravenous  PONV Risk Score and Plan: 2 and Ondansetron and Dexamethasone  Airway Management Planned: Oral ETT  Additional Equipment: None  Intra-op Plan:   Post-operative Plan: Extubation in OR  Informed Consent: I have reviewed the patients History and Physical, chart, labs and discussed the procedure including the risks, benefits and alternatives for the  proposed anesthesia with the patient or authorized representative who has indicated his/her understanding and acceptance.     Dental advisory given  Plan Discussed with: CRNA  Anesthesia Plan Comments:        Anesthesia Quick Evaluation

## 2022-08-28 NOTE — Anesthesia Procedure Notes (Signed)
Procedure Name: Intubation Date/Time: 08/28/2022 7:47 AM  Performed by: Lieutenant Diego, CRNAPre-anesthesia Checklist: Patient identified, Emergency Drugs available, Suction available and Patient being monitored Patient Re-evaluated:Patient Re-evaluated prior to induction Oxygen Delivery Method: Circle system utilized Preoxygenation: Pre-oxygenation with 100% oxygen Induction Type: IV induction Ventilation: Mask ventilation without difficulty Laryngoscope Size: Miller and 2 Grade View: Grade I Tube type: Oral Tube size: 7.5 mm Number of attempts: 1 Airway Equipment and Method: Stylet Placement Confirmation: ETT inserted through vocal cords under direct vision, positive ETCO2 and breath sounds checked- equal and bilateral Secured at: 23 cm Tube secured with: Tape Dental Injury: Teeth and Oropharynx as per pre-operative assessment

## 2022-08-28 NOTE — Op Note (Signed)
PATIENT: Benjamin [name]  DAY OF SURGERY: 08/28/22   PRE-OPERATIVE DIAGNOSIS:  Lumbar pseudoarthrosis, lumbar spine fracture, diffuse idiopathic skeletal hyperostosis   POST-OPERATIVE DIAGNOSIS:  Same   PROCEDURE:  Extension of posterior instrumented fusion from T11-pelvis   SURGEON:  Surgeon(s) and Role:    Judith Part, MD - Primary    Dawley, Pieter Partridge, DO - Assisting   ANESTHESIA: ETGA   BRIEF HISTORY: This is a 71 year old man with a history of DISH with prior fracture dislocation x2 s/p repair by me and at an outside institution who presented with severe low back pain. Workup showed a new burst fracture at L3, pseudoarthrosis / loosening of the L2 with that segment in between two fusion segments. I therefore recommended extension T11-pelvis. This was discussed with the patient as well as risks, benefits, and alternatives and wished to proceed with surgery.   OPERATIVE DETAIL: The patient was taken to the operating room and anesthesia was induced by the anesthesia team. They were placed on the OR table in the prone position with padding of all pressure points. A formal time out was performed with two patient identifiers and confirmed the operative site. The operative site was marked, hair was clipped with surgical clippers, the area was then prepped and draped in a sterile fashion. Fluoro was used to localize the operative level and a midline incision was placed to expose from T10 to sacrum and the existing hardware. Subperiosteal dissection was performed bilaterally and fluoroscopy was again used to confirm the surgical level.   The existing rod was cut at T11-12 and T10-T11 in an offset fashion and the caudal portion of the rod was removed. The loose screw was removed on the right at L2. The left L2 was also removed given its small size to allow for better fixation.  A spinous process clamp was applied and secured, followed by attachment of a reference array. The field was draped and the  O-arm was brought into the field. An intra-op CT was performed, sent to the Stealth navigation station, registered to the patient's anatomy, and confirmed with landmarks with acceptable fit. Stereotactic spinal navigation was utilized throughout the procedure for planning and placement of pedicle screw trajectories.  Instrumentation was then performed using navigated instruments to guide placement of bilateral pedicle screws (Medtronic) at L2, L3, L4, L5, and into the pelvis via S2AI trajectory. These were placed with navigated instruments by localizing the pedicle, drilling a pilot hole, cannulating the pedicle with an awl-tap, palpating for pedicle wall breaches, and then placing the screw.   These were connected with rods bilaterally and final tightened according to manufacturer torque specifications. The bone was thoroughly decorticated over the fusion surface posterolaterally out to the transverse processes from T10 to the pelviswith the addition of cortical bone chips (Medtronic).   All instrument and sponge counts were correct, the incision was then closed in layers. The patient was then returned to anesthesia for emergence. No apparent complications at the completion of the procedure.   EBL:  345m   DRAINS: none   SPECIMENS: none   TJudith Part MD 08/28/22 7:30 AM

## 2022-08-28 NOTE — Progress Notes (Signed)
Orthopedic Tech Progress Note Patient Details:  Benjamin [name] 02-01-1951 [id number]  Ortho Devices Type of Ortho Device: Lumbar corsett Ortho Device/Splint Location: BACK Ortho Device/Splint Interventions: Ordered   Post Interventions Patient Tolerated: Well Instructions Provided: Care of device  Janit Pagan 08/28/2022, 4:29 PM

## 2022-08-28 NOTE — H&P (Signed)
Surgical H&P Update  HPI: 71 y.o. with a history of DISH, two prior fracture dislocations, now with an L3 burst with severe pain. No changes in health since they were last seen. Still having the above and wishes to proceed with surgery.  PMHx:  Past Medical History:  Diagnosis Date   Acute prostatitis 07/03/2012   s/p prostate    Allergy    Anxiety    Arthritis    stenosis lower back   Back pain    BPH (benign prostatic hypertrophy) with urinary obstruction    Depression    ED (erectile dysfunction)    Headache(784.0)    "sinus" headaches   History of ITP    as a child per patient (no longer has issues per patient)   History of kidney stones    Hypercholesterolemia    Other forms of epilepsy and recurrent seizures without mention of intractable epilepsy 09/16/2013   Peripheral axonal neuropathy 08/06/2014   Prostate cancer ([facility]) 07/03/2012   Adenocarcinoma,gleason=3+3=6,PSA=5.0,volume=20cc   Seizures ([place])    last seizure-2020 per pt   Unspecified hereditary and idiopathic peripheral neuropathy 07/07/2014   Foot numbness starting 2010, hands beginning in 2014- 15 .   FamHx:  Family History  Problem Relation Age of Onset   Cancer Father 72       prostate/prostatectomy   Cancer Cousin 32       prostate ca, also heart problems   Seizures Neg Hx    SocHx:  reports that he has never smoked. He has never used smokeless tobacco. He reports current alcohol use. He reports that he does not use drugs.  Physical Exam: Strength 5/5 x4 and SILTx4 except b/l stocking numbness  Assesment/Plan: 71 y.o. man with DISH and fracture with long segment prior fixations x2, now here for extension T10 to pelvis. Risks, benefits, and alternatives discussed and the patient would like to continue with surgery.  -OR today -4NP post-op  Judith Part, MD 08/28/22 7:27 AM

## 2022-08-28 NOTE — Progress Notes (Signed)
Insertion time should be 0755 not 0735

## 2022-08-28 NOTE — Transfer of Care (Signed)
Immediate Anesthesia Transfer of Care Note  Patient: Benjamin [name]  Procedure(s) Performed: Extension of fusion Thoracic Ten to Pelvis Application of O-Arm  Patient Location: PACU  Anesthesia Type:General  Level of Consciousness: drowsy  Airway & Oxygen Therapy: Patient Spontanous Breathing and Patient connected to face mask oxygen  Post-op Assessment: Report given to RN and Post -op Vital signs reviewed and stable  Post vital signs: Reviewed and stable  Last Vitals:  Vitals Value Taken Time  BP 151/89 08/28/22 1151  Temp    Pulse 88 08/28/22 1153  Resp 14 08/28/22 1153  SpO2 96 % 08/28/22 1153  Vitals shown include unvalidated device data.  Last Pain:  Vitals:   08/28/22 0604  TempSrc:   PainSc: 0-No pain         Complications: No notable events documented.

## 2022-08-29 MED ORDER — HYDROCODONE-ACETAMINOPHEN 5-325 MG PO TABS: 1.0000 | ORAL_TABLET | ORAL | 0 refills | Status: DC | PRN

## 2022-08-29 MED ORDER — CYCLOBENZAPRINE HCL 10 MG PO TABS: 10.0000 mg | ORAL_TABLET | Freq: Three times a day (TID) | ORAL | 0 refills | Status: DC | PRN

## 2022-08-29 NOTE — Evaluation (Signed)
Physical Therapy Evaluation and Discharge Patient Details Name: Benjamin [name] MRN: [id number] DOB: [birth year] Today's Date: 08/29/2022  History of Present Illness  Pt is a 71 y.o. male s/p extension of instrumented fusion from T11-pelvis. PMH significant for Diffuse idiopathic skeletal hyperostosis (DISH), arthritis, anxiety, BPH with urinary obstruction, kidney stones, hypercholestrolemia, other forms of epilepsy with recurrent seizures (last seizure 2020), prostate cancer, and peripheral neiropathy.  Clinical Impression   Patient evaluated by Physical Therapy with no further acute PT needs identified. All education has been completed and the patient has no further questions.  See below for any follow-up Physical Therapy or equipment needs. PT is signing off. Thank you for this referral.        Recommendations for follow up therapy are one component of a multi-disciplinary discharge planning process, led by the attending physician.  Recommendations may be updated based on patient status, additional functional criteria and insurance authorization.  Follow Up Recommendations No PT follow up      Assistance Recommended at Discharge Set up Supervision/Assistance  Patient can return home with the following  Help with stairs or ramp for entrance    Equipment Recommendations Rolling walker (2 wheels)  Recommendations for Other Services       Functional Status Assessment Patient has had a recent decline in their functional status and demonstrates the ability to make significant improvements in function in a reasonable and predictable amount of time.     Precautions / Restrictions Precautions Precautions: Back Precaution Booklet Issued: Yes (comment) Precaution Comments: All education reviewed and reinforced/applied within the context of mobility Required Braces or Orthoses: Spinal Brace Spinal Brace: Lumbar corset (Per MD present in session, brace is for comfort) Restrictions Weight  Bearing Restrictions: No      Mobility  Bed Mobility Overal bed mobility:  (up on edge of bed on arrival)                  Transfers Overall transfer level: Needs assistance Equipment used: Rolling walker (2 wheels) Transfers: Sit to/from Stand Sit to Stand: Supervision           General transfer comment: vc for hand placement x 2 reps    Ambulation/Gait Ambulation/Gait assistance: Supervision Gait Distance (Feet): 400 Feet Assistive device: Rolling walker (2 wheels) Gait Pattern/deviations: Step-to pattern, Decreased stride length, Trunk flexed   Gait velocity interpretation: 1.31 - 2.62 ft/sec, indicative of limited community ambulator   General Gait Details: vc for proximity to RW and upright posture; pt reports he has not been able to stand upright for some time and required repeated cues for this  Stairs Stairs: Yes Stairs assistance: Min guard Stair Management: Two rails, One rail Left, Step to pattern, Forwards Number of Stairs: 10 General stair comments: alternating ascending; step-to descending  Wheelchair Mobility    Modified Rankin (Stroke Patients Only)       Balance Overall balance assessment: Modified Independent                                           Pertinent Vitals/Pain Pain Assessment Pain Assessment: Faces Faces Pain Scale: Hurts little more Pain Location: operative site; burning with any urinary incontience (prostate cancer) Pain Descriptors / Indicators: Aching, Burning, Discomfort, Operative site guarding Pain Intervention(s): Limited activity within patient's tolerance    Home Living Family/patient expects to be discharged to:: Private residence Living Arrangements: Spouse/significant other  Available Help at Discharge: Family;Available PRN/intermittently (wife works) Type of Home: House Home Access: Stairs to enter Entrance Stairs-Rails: Psychiatric nurse of Steps: 4 Alternate Level  Stairs-Number of Steps: [place]: Two level;Able to live on main level with bedroom/bathroom Home Equipment: Adaptive equipment;Grab bars - tub/shower      Prior Function Prior Level of Function : Independent/Modified Independent             Mobility Comments: no AD ADLs Comments: Performing ADL and IADL with no assist     Hand Dominance   Dominant Hand: Right    Extremity/Trunk Assessment   Upper Extremity Assessment Upper Extremity Assessment: LUE deficits/detail;Generalized weakness LUE Deficits / Details: Unable to fully extend L elbow, or flex to reach nose. Prior surgery on L arm    Lower Extremity Assessment Lower Extremity Assessment: Overall WFL for tasks assessed    Cervical / Trunk Assessment Cervical / Trunk Assessment: Back Surgery  Communication   Communication: No difficulties  Cognition Arousal/Alertness: Awake/alert Behavior During Therapy: WFL for tasks assessed/performed Overall Cognitive Status: Within Functional Limits for tasks assessed                                 General Comments: Recalling 3/3 spinal precautions with increased time.        General Comments General comments (skin integrity, edema, etc.): VSS    Exercises     Assessment/Plan    PT Assessment Patient does not need any further PT services  PT Problem List         PT Treatment Interventions      PT Goals (Current goals can be found in the Care Plan section)  Acute Rehab PT Goals Patient Stated Goal: go home today PT Goal Formulation: All assessment and education complete, DC therapy    Frequency       Co-evaluation               AM-PAC PT "6 Clicks" Mobility  Outcome Measure Help needed turning from your back to your side while in a flat bed without using bedrails?: A Little Help needed moving from lying on your back to sitting on the side of a flat bed without using bedrails?: A Little Help needed moving to and from a bed to a  chair (including a wheelchair)?: A Little Help needed standing up from a chair using your arms (e.g., wheelchair or bedside chair)?: A Little Help needed to walk in hospital room?: A Little Help needed climbing 3-5 steps with a railing? : A Little 6 Click Score: 18    End of Session Equipment Utilized During Treatment: Back brace Activity Tolerance: Patient tolerated treatment well Patient left: in bed;with call bell/phone within reach Nurse Communication: Mobility status;Other (comment) (pt urinated in urinal) PT Visit Diagnosis: Difficulty in walking, not elsewhere classified (R26.2)    Time: 1017-5102 PT Time Calculation (min) (ACUTE ONLY): 19 min   Charges:   PT Evaluation $PT Eval Low Complexity: [place], PT Acute Rehabilitation Services  Office [phone]   Rexanne Mano 08/29/2022, 9:06 AM

## 2022-08-29 NOTE — Plan of Care (Signed)
Pt given D/C instructions with verbal understanding. Rx's were sent to the pharmacy by MD. Pt's incision is clean and dry with no sign of infection. Pt's IV was removed prior to D/C. Pt received RW and 3-n-1 from [employer] per MD order. Pt D/C'd home via wheelchair per MD order. Pt is stable @ D/C and has no other needs at this time. Holli Humbles, RN

## 2022-08-29 NOTE — Evaluation (Signed)
Occupational Therapy Evaluation Patient Details Name: Benjamin [name] MRN: [id number] DOB: August 19, 1951 Today's Date: 08/29/2022   History of Present Illness Pt is a 71 y.o. male s/p extension of instrumented fusion from T11-pelvis. PMH significant for Diffuse idiopathic skeletal hyperostosis (DISH), arthritis, anxiety, BPH with urinary obstruction, kidney stones, hypercholestrolemia, other forms of epilepsy with recurrent seizures (last seizure 2020), prostate cancer, and peripheral neiropathy.   Clinical Impression   PTA, pt lived with his wife and was independent in ADL. Upon eval, pt performing UB ADL with set-up and LB ADL with min guard A with use of AE. Pt requiring 1-2 cues for precautions. Pt educated and demonstrating use of compensatory techniques for bed mobility, LB dressing, tub/shower transfer, and grooming within precautions. All questions answered, all education provided. Recommend discharge home with no OT follow up. Re-consult if change in status.     Recommendations for follow up therapy are one component of a multi-disciplinary discharge planning process, led by the attending physician.  Recommendations may be updated based on patient status, additional functional criteria and insurance authorization.   Follow Up Recommendations  No OT follow up    Assistance Recommended at Discharge Intermittent Supervision/Assistance  Patient can return home with the following A little help with walking and/or transfers;A little help with bathing/dressing/bathroom;Assistance with cooking/housework;Assist for transportation;Help with stairs or ramp for entrance    Functional Status Assessment  Patient has had a recent decline in their functional status and demonstrates the ability to make significant improvements in function in a reasonable and predictable amount of time.  Equipment Recommendations  BSC/3in1    Recommendations for Other Services PT consult     Precautions /  Restrictions Precautions Precautions: Back Precaution Booklet Issued: Yes (comment) Precaution Comments: All education reviewed and reinforced/applied within the context of ADL Required Braces or Orthoses: Spinal Brace Spinal Brace: Lumbar corset (Per MD present in session, brace is for comfort) Restrictions Weight Bearing Restrictions: No      Mobility Bed Mobility Overal bed mobility: Needs Assistance Bed Mobility: Rolling, Sidelying to Sit Rolling: Supervision Sidelying to sit: Supervision       General bed mobility comments: Supervision for use of compensatory techniques.    Transfers Overall transfer level: Needs assistance Equipment used: None, Rolling walker (2 wheels) Transfers: Sit to/from Stand Sit to Stand: Min guard           General transfer comment: Min guard A for safety      Balance Overall balance assessment: Needs assistance Sitting-balance support: No upper extremity supported, Feet supported Sitting balance-Leahy Scale: Good     Standing balance support: No upper extremity supported, During functional activity Standing balance-Leahy Scale: Poor Standing balance comment: Min guard A for static standing.                           ADL either performed or assessed with clinical judgement   ADL Overall ADL's : Needs assistance/impaired Eating/Feeding: Modified independent;Sitting   Grooming: Min guard;Standing Grooming Details (indicate cue type and reason): Min guard A for safety. Educating regarding compensatory techniqes Upper Body Bathing: Set up;Sitting   Lower Body Bathing: Min guard;Sit to/from stand   Upper Body Dressing : Set up;Sitting   Lower Body Dressing: Min guard;Sit to/from stand Lower Body Dressing Details (indicate cue type and reason): Min guard A and min cues for use of AE and compensatory techniques Toilet Transfer: Min guard;Comfort height toilet;Ambulation Toilet Transfer Details (indicate cue type and  reason): Short distance ambulation this session. Toileting- Clothing Manipulation and Hygiene: Supervision/safety;Sit to/from stand   Tub/ Shower Transfer: Tub transfer;Min guard;Grab bars;Ambulation;BSC/3in1 Tub/Shower Transfer Details (indicate cue type and reason): cues for use of compensatory techniques. Min guard A for safety. Min cues to avoid bending forward to look at feet Functional mobility during ADLs: Min guard;Rolling walker (2 wheels)       Vision Baseline Vision/History: 1 Wears glasses Ability to See in Adequate Light: 0 Adequate Patient Visual Report: No change from baseline Vision Assessment?: No apparent visual deficits Additional Comments: WFL for tasks assessed     Perception     Praxis      Pertinent Vitals/Pain Pain Assessment Pain Assessment: Faces Faces Pain Scale: Hurts little more Pain Location: operative site; burning with any urinary incontience (prostate cancer) Pain Descriptors / Indicators: Aching, Burning, Discomfort, Operative site guarding Pain Intervention(s): Limited activity within patient's tolerance, Monitored during session     Hand Dominance Right   Extremity/Trunk Assessment Upper Extremity Assessment Upper Extremity Assessment: LUE deficits/detail;Generalized weakness LUE Deficits / Details: Unable to fully extend L elbow, or flex to reach nose. Prior surgery on L arm   Lower Extremity Assessment Lower Extremity Assessment: Defer to PT evaluation   Cervical / Trunk Assessment Cervical / Trunk Assessment: Back Surgery   Communication Communication Communication: No difficulties   Cognition Arousal/Alertness: Awake/alert Behavior During Therapy: WFL for tasks assessed/performed Overall Cognitive Status: Within Functional Limits for tasks assessed                                 General Comments: Recalling 3/3 spinal precautions with increased time.     General Comments  VSS    Exercises     Shoulder  Instructions      Home Living Family/patient expects to be discharged to:: Private residence Living Arrangements: Spouse/significant other Available Help at Discharge: Family;Available PRN/intermittently (wife works) Type of Home: House Home Access: Stairs to enter [employer] of Steps: 4 Entrance Stairs-Rails: [place]: Two level;Able to live on main level with bedroom/bathroom Alternate Level Stairs-Number of Steps: 12   Bathroom Shower/Tub: Tub/shower unit (walk in upstairs, but able to perform tub transfer with min guard A)   Bathroom Toilet: Handicapped height     Home Equipment: Adaptive equipment;Grab bars - tub/shower          Prior Functioning/Environment Prior Level of Function : Independent/Modified Independent             Mobility Comments: no AD ADLs Comments: Performing ADL and IADL with no assist        OT Problem List: Decreased strength;Decreased activity tolerance;Impaired balance (sitting and/or standing);Decreased range of motion;Decreased coordination;Decreased knowledge of use of DME or AE;Decreased knowledge of precautions;Pain      OT Treatment/Interventions:      OT Goals(Current goals can be found in the care plan section) Acute Rehab OT Goals Patient Stated Goal: go home OT Goal Formulation: With patient  OT Frequency:      Co-evaluation              AM-PAC OT "6 Clicks" Daily Activity     Outcome Measure Help from another person eating meals?: None Help from another person taking care of personal grooming?: A Little Help from another person toileting, which includes using toliet, bedpan, or urinal?: A Little Help from another person bathing (including washing, rinsing, drying)?: A Little Help from another  person to put on and taking off regular upper body clothing?: A Little Help from another person to put on and taking off regular lower body clothing?: A Little 6 Click Score: 19   End of Session  Equipment Utilized During Treatment: Gait belt;Back brace;Rolling walker (2 wheels) Nurse Communication: Mobility status  Activity Tolerance: Patient tolerated treatment well Patient left: in bed;with call bell/phone within reach;Other (comment) (PT in room)  OT Visit Diagnosis: Unsteadiness on feet (R26.81);Muscle weakness (generalized) (M62.81);Other abnormalities of gait and mobility (R26.89);Pain Pain - part of body:  (back)                Time: 5462-7035 OT Time Calculation (min): 30 min Charges:  OT General Charges $OT Visit: 1 Visit OT Evaluation $OT Eval Low Complexity: 1 Low OT Treatments $Self Care/Home Management : 8-22 mins  Shanda Howells, OTR/L [facility] Acute Rehabilitation Office: [phone]  Lula Olszewski 08/29/2022, 9:03 AM

## 2022-08-29 NOTE — Progress Notes (Signed)
Neurosurgery Service Progress Note  Subjective: No acute events overnight, no new complaints   Objective: Vitals:   08/28/22 1506 08/28/22 2000 08/28/22 2305 08/29/22 0441  BP: (!) 147/89 135/86 135/77 (!) 142/88  Pulse: (!) 106 (!) 102 94 91  Resp: '16 18 20 18  '$ Temp:  85.8 F (37.3 C) 99 F (37.2 C) 98.7 F (37.1 C)  TempSrc:  Oral Oral Oral  SpO2: 96% 92% 93% 95%  Weight:      Height:        Physical Exam: Strength 5/5 x4 and SILTx4   Assessment & Plan: 71 y.o. man s/p T11-pelvis, recovering well.  -discharge home today after PT/OT  Judith Part  08/29/22 8:29 AM

## 2022-08-29 NOTE — Discharge Summary (Signed)
Discharge Summary  Date of Admission: 08/28/2022  Date of Discharge: 08/29/22  Attending Physician: Emelda Brothers, MD  Hospital Course: Patient was admitted following an uncomplicated extension of posterior spinal instrumentation and fusion from T11-pelvis for a fracture in the setting of DISH. They were recovered in PACU and transferred to [facility]. Their hospital course was uncomplicated and the patient was discharged home on 08/29/22. They will follow up in clinic with me in clinic in 2 weeks.  Neurologic exam at discharge:  Strength 5/5 x4 and SILTx4   Discharge diagnosis: Lumbar spine fracture  Judith Part, MD 08/29/22 8:31 AM

## 2022-08-30 NOTE — Anesthesia Postprocedure Evaluation (Signed)
Anesthesia Post Note  Patient: Benjamin [name]  Procedure(s) Performed: Extension of fusion Thoracic Ten to Pelvis Application of O-Arm     Patient location during evaluation: PACU Anesthesia Type: General Level of consciousness: awake and alert Pain management: pain level controlled Vital Signs Assessment: post-procedure vital signs reviewed and stable Respiratory status: spontaneous breathing, nonlabored ventilation and respiratory function stable Cardiovascular status: blood pressure returned to baseline and stable Postop Assessment: no apparent nausea or vomiting Anesthetic complications: no   No notable events documented.  Last Vitals:  Vitals:   08/29/22 0441 08/29/22 0925  BP: (!) 142/88 104/70  Pulse: 91 83  Resp: 18 17  Temp: 37.1 C 36.8 C  SpO2: 95% 93%    Last Pain:  Vitals:   08/29/22 1115  TempSrc:   PainSc: 4                  Jann Ra

## 2022-10-25 ENCOUNTER — Encounter: Payer: Self-pay | Admitting: Gastroenterology

## 2022-11-16 ENCOUNTER — Other Ambulatory Visit: Payer: Self-pay | Admitting: *Deleted

## 2022-11-16 DIAGNOSIS — I714 Abdominal aortic aneurysm, without rupture, unspecified: Secondary | ICD-10-CM

## 2022-11-21 ENCOUNTER — Ambulatory Visit (INDEPENDENT_AMBULATORY_CARE_PROVIDER_SITE_OTHER): Payer: Medicare HMO | Admitting: Vascular Surgery

## 2022-11-21 ENCOUNTER — Encounter: Payer: Self-pay | Admitting: Vascular Surgery

## 2022-11-21 ENCOUNTER — Ambulatory Visit (HOSPITAL_COMMUNITY)
Admission: RE | Admit: 2022-11-21 | Discharge: 2022-11-21 | Disposition: A | Discharge status: Home / Self Care | Payer: Medicare HMO | Source: Ambulatory Visit | Attending: Vascular Surgery | Admitting: Vascular Surgery

## 2022-11-21 VITALS — BP 138/85 | HR 62 | Temp 97.9°F | Resp 20 | Ht 69.0 in | Wt 230.8 lb

## 2022-11-21 DIAGNOSIS — I7121 Aneurysm of the ascending aorta, without rupture: Secondary | ICD-10-CM

## 2022-11-21 DIAGNOSIS — I714 Abdominal aortic aneurysm, without rupture, unspecified: Secondary | ICD-10-CM | POA: Diagnosis present

## 2022-11-21 NOTE — Progress Notes (Signed)
Patient ID: Benjamin [name], male   DOB: 24-Mar-1951, 71 y.o.   MRN: [id number]  Reason for Consult: New Patient (Initial Visit)   Referred by Cyndi Bender, PA-C  Subjective:     HPI:  Benjamin [name] is a 72 y.o. male has no history of vascular disease does have hypercholesterolemia.  His chief medical issue has been after trauma he has had significant back issues requiring multiple surgeries.  More recently he was identified to have a 4 cm ascending aortic aneurysm for which she now was sent here for evaluation.  He does not have any chest back or abdominal pain other than his stable back pain.  No personal or family history of aneurysm disease.  He is a lifelong non-smoker.  Past Medical History:  Diagnosis Date   Acute prostatitis 07/03/2012   s/p prostate    Allergy    Anxiety    Arthritis    stenosis lower back   Back pain    BPH (benign prostatic hypertrophy) with urinary obstruction    Depression    ED (erectile dysfunction)    Headache(784.0)    "sinus" headaches   History of ITP    as a child per patient (no longer has issues per patient)   History of kidney stones    Hypercholesterolemia    Other forms of epilepsy and recurrent seizures without mention of intractable epilepsy 09/16/2013   Peripheral axonal neuropathy 08/06/2014   Prostate cancer ([place]) 07/03/2012   Adenocarcinoma,gleason=3+3=6,PSA=5.0,volume=20cc   Seizures ([place])    last seizure-2020 per pt   Unspecified hereditary and idiopathic peripheral neuropathy 07/07/2014   Foot numbness starting 2010, hands beginning in 2014- 15 .   Family History  Problem Relation Age of Onset   Cancer Father 41       prostate/prostatectomy   Cancer Cousin 45       prostate ca, also heart problems   Seizures Neg Hx    Past Surgical History:  Procedure Laterality Date   BACK SURGERY     COLONOSCOPY     ELBOW FRACTURE SURGERY     KNEE ARTHROSCOPY  2017   LUMBAR LAMINECTOMY/DECOMPRESSION MICRODISCECTOMY N/A 12/23/2013    Procedure: Lumbar Laminectomy Decompression, Lumbar Two, Three, Four, Five;  Surgeon: Hosie Spangle, MD;  Location: [facility];  Service: Neurosurgery;  Laterality: N/A;   LYMPHADENECTOMY Bilateral 05/25/2016   Procedure: BILATERAL PELVIC LYMPHADENECTOMY;  Surgeon: Alexis Frock, MD;  Location: [facility];  Service: Urology;  Laterality: Bilateral;   POSTERIOR LUMBAR FUSION 4 LEVEL N/A 01/08/2020   Procedure: THORACIC NINE TO LUMBAR TWO POSTERIOR SPINAL INSTRUMENTED FUSION;  Surgeon: Judith Part, MD;  Location: [facility];  Service: Neurosurgery;  Laterality: N/A;   ROBOT ASSISTED LAPAROSCOPIC RADICAL PROSTATECTOMY N/A 05/25/2016   Procedure: XI ROBOTIC ASSISTED LAPAROSCOPIC RADICAL PROSTATECTOMY WITH INDOCYANINE GREEN DYE;  Surgeon: Alexis Frock, MD;  Location: [facility];  Service: Urology;  Laterality: N/A;   TOE SURGERY      Short Social History:  Social History   Tobacco Use   Smoking status: Never   Smokeless tobacco: Never  Substance Use Topics   Alcohol use: Yes    Comment: social beer occasionally    Allergies  Allergen Reactions   Aspartame And Phenylalanine Other (See Comments)    Powder causes seizures   Benadryl [Diphenhydramine] Other (See Comments)    seizures   Oxycodone Other (See Comments)    "Makes me mean"   Zoloft [Sertraline Hcl] Other (See Comments)  Confusion Memory loss    Current Outpatient Medications  Medication Sig Dispense Refill   alendronate (FOSAMAX) 70 MG tablet Take 70 mg by mouth once a week.     Apoaequorin (PREVAGEN PO) Take 1 capsule by mouth daily.     cyclobenzaprine (FLEXERIL) 10 MG tablet Take 1 tablet (10 mg total) by mouth 3 (three) times daily as needed for muscle spasms. 30 tablet 0   HYDROcodone-acetaminophen (NORCO/VICODIN) 5-325 MG tablet Take 1-2 tablets by mouth every 4 (four) hours as needed (pain). 30 tablet 0   levETIRAcetam (KEPPRA) 1000 MG tablet TAKE 1 TABLET BY MOUTH TWICE A DAY (Patient taking differently:  Take 1,000 mg by mouth 2 (two) times daily.) 180 tablet 4   triamcinolone cream (KENALOG) 0.1 % Apply 1 Application topically 3 (three) times daily as needed (Psoriasis).     No current facility-administered medications for this visit.    Review of Systems  Constitutional:  Constitutional negative. HENT: HENT negative.  Eyes: Eyes negative.  Respiratory: Respiratory negative.  Cardiovascular: Cardiovascular negative.  GI: Gastrointestinal negative.  Musculoskeletal: Positive for back pain.  Skin: Skin negative.  Neurological: Neurological negative. Hematologic: Hematologic/lymphatic negative.  Psychiatric: Psychiatric negative.        Objective:  Objective   Vitals:   11/21/22 0833  BP: 138/85  Pulse: 62  Resp: 20  Temp: 97.9 F (36.6 C)  SpO2: 96%  Weight: 230 lb 12.8 oz (104.7 kg)  Height: '5\' 9"'$  (1.753 m)   Body mass index is 34.08 kg/m.  Physical Exam HENT:     Head: Normocephalic.     Nose: Nose normal.  Eyes:     Pupils: Pupils are equal, round, and reactive to light.  Neck:     Vascular: No carotid bruit.  Cardiovascular:     Rate and Rhythm: Regular rhythm.     Pulses: Normal pulses.     Heart sounds: Normal heart sounds.  Pulmonary:     Effort: Pulmonary effort is normal.  Abdominal:     General: Abdomen is flat.     Palpations: Abdomen is soft.  Musculoskeletal:     Cervical back: Normal range of motion and neck supple.  Skin:    Capillary Refill: Capillary refill takes less than 2 seconds.  Neurological:     Mental Status: He is alert.  Psychiatric:        Thought Content: Thought content normal.        Judgment: Judgment normal.     Data: Abdominal Aorta Findings:  +-----------+-------+----------+----------+---------+--------+--------+  Location  AP (cm)Trans (cm)PSV (cm/s)Waveform ThrombusComments  +-----------+-------+----------+----------+---------+--------+--------+  Proximal  2.91   2.78      69                                    +-----------+-------+----------+----------+---------+--------+--------+  Mid       1.69   1.91      70        triphasic                  +-----------+-------+----------+----------+---------+--------+--------+  Distal    1.76   1.65      69        triphasic                  +-----------+-------+----------+----------+---------+--------+--------+  RT CIA Prox1.7    1.3       160       triphasic                  +-----------+-------+----------+----------+---------+--------+--------+  LT CIA Prox1.4    1.6       108       biphasic                   +-----------+-------+----------+----------+---------+--------+--------+   Visualization of the Proximal Abdominal Aorta was limited.       Summary:  Abdominal Aorta: No evidence of an abdominal aortic aneurysm was  visualized. The largest aortic measurement is 2.9 cm.      Assessment/Plan:    72 year old male with normal size abdominal aorta and also descending thoracic aorta with slightly enlarged ascending aorta measuring 4 cm last July.  As such I will send him to see CT surgery in 6 more months for further evaluation he can see me on an as-needed basis.     Waynetta Sandy MD [facility]

## 2022-11-30 ENCOUNTER — Encounter: Payer: Self-pay | Admitting: *Deleted

## 2022-12-10 NOTE — Progress Notes (Unsigned)
[address]       ,[place] [address]             [phone]   PCP is Cyndi Bender, PA-C Referring Provider is Bobette Mo*  Chief Complaint: Ascending thoracic aortic aneurysm   HPI: This is a 72 year old with a past medical history of BPH, ITP (as a child), hypercholesterolemia, prostate cancer, peripheral axonal neuropathy, seizures who was found to have an ascending thoracic aortic aneurysm measuring 4 cm. Patient denies chest pain, tightness, or pressure, LE edema, or shortness of breath.   Past Medical History:  Diagnosis Date   Acute prostatitis 07/03/2012   s/p prostate    Allergy    Anxiety    Arthritis    stenosis lower back   Back pain    BPH (benign prostatic hypertrophy) with urinary obstruction    Depression    ED (erectile dysfunction)    Headache(784.0)    "sinus" headaches   History of ITP    as a child per patient (no longer has issues per patient)   History of kidney stones    Hypercholesterolemia    Other forms of epilepsy and recurrent seizures without mention of intractable epilepsy 09/16/2013   Peripheral axonal neuropathy 08/06/2014   Prostate cancer ([place]) 07/03/2012   Adenocarcinoma,gleason=3+3=6,PSA=5.0,volume=20cc   Seizures ([place])    last seizure-2020 per pt   Unspecified hereditary and idiopathic peripheral neuropathy 07/07/2014   Foot numbness starting 2010, hands beginning in 2014- 15 .    Past Surgical History:  Procedure Laterality Date   BACK SURGERY     COLONOSCOPY     ELBOW FRACTURE SURGERY     KNEE ARTHROSCOPY  2017   LUMBAR LAMINECTOMY/DECOMPRESSION MICRODISCECTOMY N/A 12/23/2013   Procedure: Lumbar Laminectomy Decompression, Lumbar Two, Three, Four, Five;  Surgeon: Hosie Spangle, MD;  Location: [facility];  Service: Neurosurgery;  Laterality: N/A;   LYMPHADENECTOMY Bilateral 05/25/2016   Procedure: BILATERAL PELVIC LYMPHADENECTOMY;  Surgeon: Alexis Frock, MD;  Location: [facility];   Service: Urology;  Laterality: Bilateral;   POSTERIOR LUMBAR FUSION 4 LEVEL N/A 01/08/2020   Procedure: THORACIC NINE TO LUMBAR TWO POSTERIOR SPINAL INSTRUMENTED FUSION;  Surgeon: Judith Part, MD;  Location: [facility];  Service: Neurosurgery;  Laterality: N/A;   ROBOT ASSISTED LAPAROSCOPIC RADICAL PROSTATECTOMY N/A 05/25/2016   Procedure: XI ROBOTIC ASSISTED LAPAROSCOPIC RADICAL PROSTATECTOMY WITH INDOCYANINE GREEN DYE;  Surgeon: Alexis Frock, MD;  [facility];  Service: Urology;  Laterality: N/A;   TOE SURGERY      Family History  Problem Relation Age of Onset   Cancer Father 7       prostate/prostatectomy   Cancer Cousin 54       prostate ca, also heart problems   Seizures Neg Hx     Social History Social History   Tobacco Use   Smoking status: Never   Smokeless tobacco: Never  Vaping Use   Vaping Use: Never used  Substance Use Topics   Alcohol use: Yes    Comment: social beer occasionally   Drug use: No    Comment: chewed in high school short time    Current Outpatient Medications  Medication Sig Dispense Refill   alendronate (FOSAMAX) 70 MG tablet Take 70 mg by mouth once a week.     Apoaequorin (PREVAGEN PO) Take 1 capsule by mouth daily.     cyclobenzaprine (FLEXERIL) 10 MG tablet Take 1 tablet (10 mg total) by mouth  3 (three) times daily as needed for muscle spasms. 30 tablet 0   HYDROcodone-acetaminophen (NORCO/VICODIN) 5-325 MG tablet Take 1-2 tablets by mouth every 4 (four) hours as needed (pain). 30 tablet 0   levETIRAcetam (KEPPRA) 1000 MG tablet TAKE 1 TABLET BY MOUTH TWICE A DAY (Patient taking differently: Take 1,000 mg by mouth 2 (two) times daily.) 180 tablet 4   triamcinolone cream (KENALOG) 0.1 % Apply 1 Application topically 3 (three) times daily as needed (Psoriasis).     No current facility-administered medications for this visit.    Allergies  Allergen Reactions   Aspartame And Phenylalanine Other (See Comments)    Powder causes  seizures   Benadryl [Diphenhydramine] Other (See Comments)    seizures   Oxycodone Other (See Comments)    "Makes me mean"   Zoloft [Sertraline Hcl] Other (See Comments)    Confusion Memory loss    Review of Systems:  Chest Pain [  ] Resting SOB '[ ]'$  Exertional SOB [  ]  Pedal Edema [  ] Syncope [  ] Presyncope [  ]  General Review of Systems: [Y] = yes [ N]=no  Consitutional:   nausea '[ ]'$ ;  fever '[ ]'$ ;  Eye : blurred vision '[ ]'$ ; Amaurosis fugax[  ];  Resp: cough '[ ]'$ ;  hemoptysis'[ ]'$ ;  GI: vomiting'[ ]'$ ; melena'[ ]'$ ; hematochezia '[]'$ ;  GU:hematuria'[ ]'$ ; Musculoskeletal: myalgias'[ ]'$ ; joint swelling[  ]; joint erythema'[ ]'$ ;  Heme/Lymph: anemia'[ ]'$ ;  Neuro: TIA'[ ]'$ ;stroke'[ ]'$ ;  seizures'[ ]'$ ;  Endocrine: diabetes'[ ]'$ ;   Vital Signs:  Physical Exam: CV- Pulmonary- Neck- Abdomen- Extremities- Neurologic-   Diagnostic Tests: Summary of results of CT of the chest without contrast done 11/30/2022: -Interval resolution of the nodular area of consolidation within the subpleural RLLL -Ascending thoracic aorta measures 4 cm -Pulmonary artery upper limits of normal -Hepatic steatosis  Impression and Plan: We reviewed the findings of the CT of the chest and the ATAA was measured at 4 cm which is well below 5.5 cm for consideration of surgery.  Risk Modification in those with ascending thoracic aortic aneurysm:  Continue good control of blood pressure (prefer SBP 130/80 or less)  2. Avoid fluoroquinolone antibiotics (I.e Ciprofloxacin, Avelox, Levofloxacin, Ofloxacin)  3.  Use of statin (to decrease cardiovascular risk)  4.  Exercise and activity limitations is individualized, but in general, contact sports are to be  avoided and one should avoid heavy lifting (defined as half of ideal body weight) and exercises involving sustained Valsalva maneuver.  5. Counseling for those suspected of having genetically mediated disease. First-degree relatives of those with TAA disease should be  screened as well as those who have a connective tissue disease (I.e with Marfan syndrome, Ehlers-Danlos syndrome,  and Loeys-Dietz syndrome) or a  bicuspid aortic valve,have an increased risk for  complications related to TAA  6. If one has tobacco abuse, smoking cessation is highly encouraged.     12/13/2022, PA-C [facility] [phone]

## 2022-12-11 ENCOUNTER — Institutional Professional Consult (permissible substitution) (INDEPENDENT_AMBULATORY_CARE_PROVIDER_SITE_OTHER): Payer: Medicare HMO | Admitting: Physician Assistant

## 2022-12-11 VITALS — BP 154/91 | HR 63 | Resp 20 | Ht 69.0 in | Wt 225.0 lb

## 2022-12-11 DIAGNOSIS — I7121 Aneurysm of the ascending aorta, without rupture: Secondary | ICD-10-CM | POA: Diagnosis not present

## 2022-12-11 NOTE — Patient Instructions (Addendum)
   Risk Modification in those with ascending thoracic aortic aneurysm:  Continue good control of blood pressure (prefer SBP 130/80 or less)-heart healthy diet and little to no salt. He has been using a lot of salt lately as has had leg cramps  2. Avoid fluoroquinolone antibiotics (I.e Ciprofloxacin, Avelox, Levofloxacin, Ofloxacin)  3.  Use of statin (to decrease cardiovascular risk)  4.  Exercise and activity limitations is individualized, but in general, contact sports are to be  avoided and one should avoid heavy lifting (defined as half of ideal body weight) and exercises involving sustained Valsalva maneuver. He cannot lift much secondary to back problems  5. Counseling for those suspected of having genetically mediated disease. First-degree relatives of those with TAA disease should be screened as well as those who have a connective tissue disease (I.e with Marfan syndrome, Ehlers-Danlos syndrome,  and Loeys-Dietz syndrome) or a  bicuspid aortic valve,have an increased risk for  complications related to TAA. No family history of aforementioned. He had not had echo and Will monitor need to obtain one.  6. No history of tobacco abuse.

## 2022-12-13 ENCOUNTER — Ambulatory Visit (INDEPENDENT_AMBULATORY_CARE_PROVIDER_SITE_OTHER): Payer: Medicare HMO | Admitting: Gastroenterology

## 2022-12-13 ENCOUNTER — Encounter: Payer: Self-pay | Admitting: Gastroenterology

## 2022-12-13 VITALS — BP 110/90 | HR 64 | Ht 72.0 in | Wt 223.2 lb

## 2022-12-13 DIAGNOSIS — Z86010 Personal history of colonic polyps: Secondary | ICD-10-CM | POA: Diagnosis not present

## 2022-12-13 NOTE — Patient Instructions (Signed)
_______________________________________________________  If your blood pressure at your visit was 140/90 or greater, please contact your primary care physician to follow up on this.  _______________________________________________________  If you are age 70 or older, your body mass index should be between 23-30. Your Body mass index is 30.28 kg/m. If this is out of the aforementioned range listed, please consider follow up with your Primary Care Provider.  If you are age 49 or younger, your body mass index should be between 19-25. Your Body mass index is 30.28 kg/m. If this is out of the aformentioned range listed, please consider follow up with your Primary Care Provider.   ________________________________________________________  The [facility] providers would like to encourage you to use [facility] to communicate with providers for non-urgent requests or questions.  Due to long hold times on the telephone, sending your provider a message by [facility] may be a faster and more efficient way to get a response.  Please allow 48 business hours for a response.  Please remember that this is for non-urgent requests.  _______________________________________________________  It has been recommended to you by your physician that you have a colonoscopy completed. Per your request, we did not schedule the procedure(s) today. Please contact our office at [phone] should you decide to have the procedure completed.  Please call with any questions or concerns.  Thank you,  Dr. Jackquline Denmark
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
## 2022-12-20 ENCOUNTER — Encounter: Payer: Self-pay | Admitting: Adult Health

## 2022-12-20 ENCOUNTER — Ambulatory Visit (INDEPENDENT_AMBULATORY_CARE_PROVIDER_SITE_OTHER): Payer: Medicare HMO | Admitting: Adult Health

## 2022-12-20 VITALS — BP 118/78 | HR 68 | Ht 72.0 in | Wt 224.0 lb

## 2022-12-20 DIAGNOSIS — G6289 Other specified polyneuropathies: Secondary | ICD-10-CM

## 2022-12-20 DIAGNOSIS — R569 Unspecified convulsions: Secondary | ICD-10-CM

## 2022-12-20 NOTE — Patient Instructions (Signed)
Your Plan:  Continue Keppra 1000 mg twice a day Please let us know if you have any seizure events     Thank you for coming to see [place] at [facility]. I hope we have been able to provide you high quality care today.  You may receive a patient satisfaction survey over the next few weeks. We would appreciate your feedback and comments so that we may continue to improve ourselves and the health of our patients.
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
## 2022-12-28 ENCOUNTER — Encounter: Payer: Self-pay | Admitting: Gastroenterology

## 2023-01-30 ENCOUNTER — Ambulatory Visit (AMBULATORY_SURGERY_CENTER): Payer: Medicare HMO | Admitting: *Deleted

## 2023-01-30 VITALS — Ht 72.0 in | Wt 220.0 lb

## 2023-01-30 DIAGNOSIS — Z86010 Personal history of colonic polyps: Secondary | ICD-10-CM

## 2023-01-30 MED ORDER — NA SULFATE-K SULFATE-MG SULF 17.5-3.13-1.6 GM/177ML PO SOLN: 1.0000 | Freq: Once | ORAL | 0 refills | Status: AC

## 2023-01-30 NOTE — Progress Notes (Signed)
Pt's previsit is done over the phone and all paperwork (prep instructions) sent to patient. Pt's name and DOB verified at the beginning of the previsit. Pt denies any difficulty with ambulating.  No egg or soy allergy known to patient  No issues know issues being intubated No FH of Malignant Hyperthermia Pt is not on diet pills Pt is not on  home 02  Pt is not on blood thinners  Pt denies issues with constipation  Pt is not on dialysis Pt denies any upcoming cardiac testing Pt encouraged to use to use Singlecare or Goodrx to reduce cost  Patient's chart reviewed by Osvaldo Angst CNRA prior to previsit and patient appropriate for the [place].  Previsit completed and red dot placed by patient's name on their procedure day (on provider's schedule).  . Visit by phone Pt states her weight is 220lb Instructions reviewed with pt and pt states understanding. Instructed to review again prior to procedure. Pt states they will.  Instructions sent by mail with coupon and by my chart

## 2023-02-08 ENCOUNTER — Other Ambulatory Visit: Payer: Self-pay | Admitting: Adult Health

## 2023-02-26 ENCOUNTER — Encounter: Payer: Self-pay | Admitting: Gastroenterology

## 2023-02-26 ENCOUNTER — Ambulatory Visit (AMBULATORY_SURGERY_CENTER): Payer: Medicare HMO | Admitting: Gastroenterology

## 2023-02-26 VITALS — BP 146/91 | HR 64 | Temp 98.9°F | Resp 18 | Ht 72.0 in | Wt 220.0 lb

## 2023-02-26 DIAGNOSIS — D122 Benign neoplasm of ascending colon: Secondary | ICD-10-CM | POA: Diagnosis not present

## 2023-02-26 DIAGNOSIS — Z86010 Personal history of colonic polyps: Secondary | ICD-10-CM | POA: Diagnosis not present

## 2023-02-26 DIAGNOSIS — Z09 Encounter for follow-up examination after completed treatment for conditions other than malignant neoplasm: Secondary | ICD-10-CM

## 2023-02-26 MED ORDER — SODIUM CHLORIDE 0.9 % IV SOLN: 500.0000 mL | INTRAVENOUS | Status: DC

## 2023-02-26 NOTE — Patient Instructions (Addendum)
Await pathology results.   Continue present medications. High Fiber diet.  Handouts on polyps, hemorrhoids, diverticulosis, and high fiber diet provided.  YOU HAD AN ENDOSCOPIC PROCEDURE TODAY AT THE [facility]:   Refer to the procedure report that was given to you for any specific questions about what was found during the examination.  If the procedure report does not answer your questions, please call your gastroenterologist to clarify.  If you requested that your care partner not be given the details of your procedure findings, then the procedure report has been included in a sealed envelope for you to review at your convenience later.  YOU SHOULD EXPECT: Some feelings of bloating in the abdomen. Passage of more gas than usual.  Walking can help get rid of the air that was put into your GI tract during the procedure and reduce the bloating. If you had a lower endoscopy (such as a colonoscopy or flexible sigmoidoscopy) you may notice spotting of blood in your stool or on the toilet paper. If you underwent a bowel prep for your procedure, you may not have a normal bowel movement for a few days.  Please Note:  You might notice some irritation and congestion in your nose or some drainage.  This is from the oxygen used during your procedure.  There is no need for concern and it should clear up in a day or so.  SYMPTOMS TO REPORT IMMEDIATELY:  Following lower endoscopy (colonoscopy or flexible sigmoidoscopy):  Excessive amounts of blood in the stool  Significant tenderness or worsening of abdominal pains  Swelling of the abdomen that is new, acute  Fever of 100F or higher   For urgent or emergent issues, a gastroenterologist can be reached at any hour by calling [phone]. Do not use MyChart messaging for urgent concerns.    DIET:  We do recommend a small meal at first, but then you may proceed to your regular diet.  Drink plenty of fluids but you should avoid alcoholic  beverages for 24 hours.  ACTIVITY:  You should plan to take it easy for the rest of today and you should NOT DRIVE or use heavy machinery until tomorrow (because of the sedation medicines used during the test).    FOLLOW UP: Our staff will call the number listed on your records the next business day following your procedure.  We will call around 7:15- 8:00 am to check on you and address any questions or concerns that you may have regarding the information given to you following your procedure. If we do not reach you, we will leave a message.     If any biopsies were taken you will be contacted by phone or by letter within the next 1-3 weeks.  Please call us at [phone] if you have not heard about the biopsies in 3 weeks.    SIGNATURES/CONFIDENTIALITY: You and/or your care partner have signed paperwork which will be entered into your electronic medical record.  These signatures attest to the fact that that the information above on your After Visit Summary has been reviewed and is understood.  Full responsibility of the confidentiality of this discharge information lies with you and/or your care-partner.

## 2023-02-26 NOTE — Op Note (Signed)
[name] Patient Name: Benjamin [name] Procedure Date: 02/26/2023 2:06 PM MRN: [id number] Endoscopist: Lynann Bologna , MD, [id number] Age: 72 Referring MD:  Date of Birth: [birth year] Gender: Male Account #: [id number] Procedure:                Colonoscopy Indications:              High risk colon cancer surveillance: Personal                            history of colonic polyps )(SSA on colonoscopy 2018                            by Dr. Charm Barges) Medicines:                Monitored Anesthesia Care Procedure:                Pre-Anesthesia Assessment:                           - Prior to the procedure, a History and Physical                            was performed, and patient medications and                            allergies were reviewed. [name] tolerance of                            previous anesthesia was also reviewed. The risks                            and benefits of the procedure and the sedation                            options and risks were discussed with the patient.                            All questions were answered, and informed consent                            was obtained. Prior Anticoagulants: The patient has                            taken no anticoagulant or antiplatelet agents. ASA                            Grade Assessment: II - A patient with mild systemic                            disease. After reviewing the risks and benefits,                            the patient was deemed in satisfactory condition to  undergo the procedure.                           After obtaining informed consent, the colonoscope                            was passed under direct vision. Throughout the                            procedure, the patient's blood pressure, pulse, and                            oxygen saturations were monitored continuously. The                            Olympus CF-HQ190L [id number]) Colonoscope was                             introduced through the anus and advanced to the the                            cecum, identified by appendiceal orifice and                            ileocecal valve. The colonoscopy was performed                            without difficulty. The patient tolerated the                            procedure well. The quality of the bowel                            preparation was good. The ileocecal valve,                            appendiceal orifice, and rectum were photographed. Scope In: 2:10:00 PM Scope Out: 2:25:47 PM Scope Withdrawal Time: 0 hours 10 minutes 47 seconds  Total Procedure Duration: 0 hours 15 minutes 47 seconds  Findings:                 A 4 mm polyp was found in the ascending colon. The                            polyp was sessile. The polyp was removed with a                            cold snare. Resection and retrieval were complete.                            The polyp was removed with a cold biopsy forceps.                            Resection and retrieval were complete.  A few small-mouthed diverticula were found in the                            sigmoid colon.                           Non-bleeding internal hemorrhoids were found during                            retroflexion. The hemorrhoids were moderate and                            Grade I (internal hemorrhoids that do not prolapse).                           The exam was otherwise without abnormality on                            direct and retroflexion views. Complications:            No immediate complications. Estimated Blood Loss:     Estimated blood loss: none. Impression:               - One 4 mm polyp in the ascending colon, removed                            with a cold snare and removed with a cold biopsy                            forceps. Resected and retrieved.                           - Mild sigmoid diverticulosis.                            - Non-bleeding internal hemorrhoids.                           - The examination was otherwise normal on direct                            and retroflexion views.                           - The GI Genius (intelligent endoscopy module),                            computer-aided polyp detection system powered by AI                            was utilized to detect colorectal polyps through                            enhanced visualization during colonoscopy. Recommendation:           - Patient has a contact number available for  emergencies. The signs and symptoms of potential                            delayed complications were discussed with the                            patient. Return to normal activities tomorrow.                            Written discharge instructions were provided to the                            patient.                           - High fiber diet.                           - Continue present medications.                           - Await pathology results.                           - Repeat colonoscopy for surveillance based on                            pathology results.                           - The findings and recommendations were discussed                            with the patient's family. Lynann Bologna, MD 02/26/2023 2:30:55 PM This report has been signed electronically.

## 2023-02-26 NOTE — Progress Notes (Signed)
Patient positioned himself prior to sedation to his comfort, prior to procedure. Uneventful anesthetic. Report to pacu rn. Vss. Care resumed by rn.
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
## 2023-02-26 NOTE — Progress Notes (Signed)
Called to room to assist during endoscopic procedure.  Patient ID and intended procedure confirmed with present staff. Received instructions for my participation in the procedure from the performing physician.  

## 2023-02-26 NOTE — Progress Notes (Signed)
Pt. states no medical or surgical changes since previsit or office visit. 

## 2023-02-27 ENCOUNTER — Telehealth: Payer: Self-pay | Admitting: *Deleted

## 2023-02-27 NOTE — Telephone Encounter (Signed)
Post procedure follow up call placed, no answer and no VM.  

## 2023-03-06 ENCOUNTER — Encounter: Payer: Self-pay | Admitting: Gastroenterology

## 2023-03-27 ENCOUNTER — Telehealth: Payer: Self-pay | Admitting: Gastroenterology

## 2023-03-27 NOTE — Telephone Encounter (Signed)
Patient called regarding colonoscopy results from 02/26/23. He is requesting a call back to discuss whether or not he will need to make a follow up appointment. Also is wondering the next time he would be due for a colonoscopy. States it is okay to leave a detailed voicemail if he is unable to answer phone call. Please advise, thank you.

## 2023-03-27 NOTE — Telephone Encounter (Signed)
Pt was contacted and made aware of Dr. Chales Abrahams Path letter. Read a lot to Pt.  The polyp(s) removed were precancerous.  Normally 7 year colonoscopy follow up is recommended.  In general, we stop routine screening  colonoscopy around 77.  Accordingly, we will not contact you to schedule a follow up exam.   This is good news.  Pt verbalized understanding with all questions answered.

## 2023-06-25 ENCOUNTER — Telehealth: Payer: Self-pay

## 2023-06-25 NOTE — Telephone Encounter (Signed)
DMV forms received, completed and placed on NP desk for review and signature.

## 2023-06-26 NOTE — Telephone Encounter (Signed)
Forms signed, placed in MR for pick up.

## 2023-10-22 ENCOUNTER — Other Ambulatory Visit: Payer: Self-pay | Admitting: Thoracic Surgery (Cardiothoracic Vascular Surgery)

## 2023-10-22 DIAGNOSIS — I7121 Aneurysm of the ascending aorta, without rupture: Secondary | ICD-10-CM

## 2023-12-04 ENCOUNTER — Encounter: Payer: Self-pay | Admitting: Thoracic Surgery (Cardiothoracic Vascular Surgery)

## 2023-12-16 ENCOUNTER — Telehealth: Payer: Self-pay | Admitting: Adult Health

## 2023-12-16 NOTE — Telephone Encounter (Signed)
 Pt called to verify appointment

## 2023-12-18 NOTE — Progress Notes (Signed)
 HPI:  [name] is a 73 year old gentleman with a past history of benign prostatic hyperplasia, ITP as a child, dyslipidemia, prostate cancer, peripheral axonal neuropathy, and a seizure disorder.  He is status post spinal surgery within the past few years.  He was also noted to have pulmonary nodules and underwent CT chest last January for follow-up of these lesions.  He was incidentally noted to have a 4.0 cm thoracic aortic aneurysm and was referred to usfor evaluation and surveillance.  He returns today for 1 year follow-up with CTA chest reevaluate the thoracic aorta.  [name] reports no changes in his health over the past year.  He continues to exercise using a treadmill 4 times a week.  He denies any chest pain.   Current Outpatient Medications  Medication Sig Dispense Refill   alendronate (FOSAMAX) 70 MG tablet Take 70 mg by mouth once a week.     levETIRAcetam  (KEPPRA ) 1000 MG tablet TAKE 1 TABLET BY MOUTH TWICE A DAY 180 tablet 4   triamcinolone  cream (KENALOG ) 0.1 % Apply 1 Application topically 3 (three) times daily as needed (Psoriasis).         Physical Exam  Vital signs BP 120/81 Heart rate 82 Respirations 20 SpO2 97% on room air  General: 73 year old male in no distress. Heart: Regular rate and rhythm Chest: Normal work of breathing, breath sounds are clear Extremities: With no peripheral edema Neuro: Intact   Diagnostic Tests:  CLINICAL DATA:  Follow up thoracic aortic aneurysm.   EXAM: CT CHEST WITHOUT CONTRAST   TECHNIQUE: Multidetector CT imaging of the chest was performed following the standard protocol without IV contrast.   RADIATION DOSE REDUCTION: This exam was performed according to the departmental dose-optimization program which includes automated exposure control, adjustment of the mA and/or kV according to patient size and/or use of iterative reconstruction technique.   COMPARISON:  Chest CT 11/22/2022 and 06/08/2022.    FINDINGS: Cardiovascular: Mild cardiac pulsation artifact limits measurement of the ascending aorta. However, mild dilatation of the ascending aorta to 4.0 cm is grossly stable. There is no significant atherosclerotic calcification within the aorta or great vessels. Minimal coronary artery disease noted. The heart size is normal. There is no pericardial effusion.   Mediastinum/Nodes: There are no enlarged mediastinal, hilar or axillary lymph nodes.Unchanged calcified right hilar lymph nodes. Stable small hiatal hernia.   Lungs/Pleura: No pleural effusion or pneumothorax. Stable mild linear scarring at both lung bases and stable calcified right middle lobe granuloma. No confluent airspace disease or suspicious pulmonary nodule.   Upper abdomen: Hepatic steatosis again noted. No acute findings are seen in the visualized upper abdomen.   Musculoskeletal/Chest wall: There is no chest wall mass or suspicious osseous finding. Status post multilevel thoracolumbar spinal fixation with unchanged discontinuity of the interconnecting rods in the lower thoracic region. Grossly stable chronic rib, thoracolumbar and sternal fractures.   IMPRESSION: 1. Stable mild dilatation of the ascending aorta to 4.0 cm. 2. No acute chest findings. 3. Stable chronic rib, thoracolumbar and sternal fractures. Unchanged discontinuity of the lower thoracic spinal fixation rods bilaterally. 4. Hepatic steatosis.     Electronically Signed   By: Elsie Perone M.D.   On: 12/19/2023 12:52  Impression / Plan: Stable 4.0 cm thoracic aortic aneurysm unchanged over the past 12 months.  Reviewed the importance of ongoing surveillance, limitations on activity, and careful blood pressure management.  Follow-up in 1 year with noncontrast chest CT.   Carder Yin G. Brylan Seubert, PA-C [facility] [phone]

## 2023-12-19 ENCOUNTER — Ambulatory Visit: Payer: Medicare HMO

## 2023-12-19 ENCOUNTER — Ambulatory Visit
Admission: RE | Admit: 2023-12-19 | Discharge: 2023-12-19 | Disposition: A | Discharge status: Home / Self Care | Payer: Medicare HMO | Source: Ambulatory Visit | Attending: Thoracic Surgery (Cardiothoracic Vascular Surgery) | Admitting: Thoracic Surgery (Cardiothoracic Vascular Surgery)

## 2023-12-19 ENCOUNTER — Encounter: Payer: Self-pay | Admitting: Physician Assistant

## 2023-12-19 VITALS — BP 120/81 | HR 82 | Resp 20 | Ht 72.0 in | Wt 220.0 lb

## 2023-12-19 DIAGNOSIS — I7121 Aneurysm of the ascending aorta, without rupture: Secondary | ICD-10-CM

## 2023-12-19 NOTE — Patient Instructions (Signed)
 Continue to avoid strenuous activity with no lifting greater than 30 pounds.  Continue careful blood pressure management as you are doing.  Is recommended that you avoid the fluoroquinolone class of antibiotics as these are not known to weaken connective tissue and increase the risk of expansion or rupture of the aneurysm  Follow-up in 1 year with chest CT scan

## 2023-12-23 ENCOUNTER — Ambulatory Visit (INDEPENDENT_AMBULATORY_CARE_PROVIDER_SITE_OTHER): Payer: Medicare HMO | Admitting: Adult Health

## 2023-12-23 ENCOUNTER — Encounter: Payer: Self-pay | Admitting: Adult Health

## 2023-12-23 VITALS — BP 137/79 | HR 75 | Ht 71.0 in | Wt 232.0 lb

## 2023-12-23 DIAGNOSIS — G6289 Other specified polyneuropathies: Secondary | ICD-10-CM | POA: Diagnosis not present

## 2023-12-23 DIAGNOSIS — R569 Unspecified convulsions: Secondary | ICD-10-CM | POA: Diagnosis not present

## 2023-12-23 MED ORDER — LEVETIRACETAM 1000 MG PO TABS: 1000.0000 mg | ORAL_TABLET | Freq: Two times a day (BID) | ORAL | 4 refills | Status: AC

## 2023-12-23 NOTE — Patient Instructions (Signed)
Your Plan:  Continue Keppra 1000 mg twice a day If your symptoms worsen or you develop new symptoms please let us know.   Thank you for coming to see us at [facility]. I hope we have been able to provide you high quality care today.  You may receive a patient satisfaction survey over the next few weeks. We would appreciate your feedback and comments so that we may continue to improve ourselves and the health of our patients.   
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
## 2024-11-19 ENCOUNTER — Other Ambulatory Visit: Payer: Self-pay | Admitting: Thoracic Surgery (Cardiothoracic Vascular Surgery)

## 2024-11-19 DIAGNOSIS — I7121 Aneurysm of the ascending aorta, without rupture: Secondary | ICD-10-CM

## 2024-12-15 ENCOUNTER — Ambulatory Visit (HOSPITAL_COMMUNITY)
Admission: RE | Admit: 2024-12-15 | Discharge: 2024-12-15 | Disposition: A | Source: Ambulatory Visit | Attending: Thoracic Surgery (Cardiothoracic Vascular Surgery) | Admitting: Thoracic Surgery (Cardiothoracic Vascular Surgery)

## 2024-12-15 DIAGNOSIS — I7121 Aneurysm of the ascending aorta, without rupture: Secondary | ICD-10-CM

## 2024-12-22 ENCOUNTER — Ambulatory Visit: Payer: Medicare HMO | Admitting: Adult Health

## 2024-12-29 ENCOUNTER — Ambulatory Visit
